# Patient Record
Sex: Male | Born: 1935 | Race: Black or African American | Hispanic: No | Marital: Married | Smoking: Never smoker
Health system: Southern US, Community
[De-identification: ages and names within clinical notes are randomized; demographics above are authoritative.]

## PROBLEM LIST (undated history)

## (undated) DIAGNOSIS — G4730 Sleep apnea, unspecified: Secondary | ICD-10-CM

## (undated) DIAGNOSIS — E7800 Pure hypercholesterolemia, unspecified: Secondary | ICD-10-CM

## (undated) DIAGNOSIS — I10 Essential (primary) hypertension: Secondary | ICD-10-CM

## (undated) DIAGNOSIS — I4891 Unspecified atrial fibrillation: Secondary | ICD-10-CM

## (undated) DIAGNOSIS — D62 Acute posthemorrhagic anemia: Secondary | ICD-10-CM

## (undated) DIAGNOSIS — E871 Hypo-osmolality and hyponatremia: Secondary | ICD-10-CM

## (undated) DIAGNOSIS — D72829 Elevated white blood cell count, unspecified: Secondary | ICD-10-CM

## (undated) DIAGNOSIS — K5900 Constipation, unspecified: Secondary | ICD-10-CM

## (undated) DIAGNOSIS — Z7901 Long term (current) use of anticoagulants: Secondary | ICD-10-CM

## (undated) DIAGNOSIS — M1990 Unspecified osteoarthritis, unspecified site: Secondary | ICD-10-CM

## (undated) DIAGNOSIS — M1611 Unilateral primary osteoarthritis, right hip: Secondary | ICD-10-CM

## (undated) DIAGNOSIS — E785 Hyperlipidemia, unspecified: Secondary | ICD-10-CM

## (undated) DIAGNOSIS — Z8679 Personal history of other diseases of the circulatory system: Secondary | ICD-10-CM

## (undated) DIAGNOSIS — J309 Allergic rhinitis, unspecified: Secondary | ICD-10-CM

## (undated) DIAGNOSIS — Z87898 Personal history of other specified conditions: Secondary | ICD-10-CM

## (undated) DIAGNOSIS — J45909 Unspecified asthma, uncomplicated: Secondary | ICD-10-CM

## (undated) DIAGNOSIS — Z8709 Personal history of other diseases of the respiratory system: Secondary | ICD-10-CM

## (undated) DIAGNOSIS — I499 Cardiac arrhythmia, unspecified: Secondary | ICD-10-CM

## (undated) DIAGNOSIS — Z950 Presence of cardiac pacemaker: Secondary | ICD-10-CM

## (undated) DIAGNOSIS — R2681 Unsteadiness on feet: Secondary | ICD-10-CM

## (undated) DIAGNOSIS — K219 Gastro-esophageal reflux disease without esophagitis: Secondary | ICD-10-CM

## (undated) DIAGNOSIS — J4541 Moderate persistent asthma with (acute) exacerbation: Secondary | ICD-10-CM

## (undated) HISTORY — DX: Hyperlipidemia, unspecified: E78.5

## (undated) HISTORY — DX: Unilateral primary osteoarthritis, right hip: M16.11

## (undated) HISTORY — PX: OTHER SURGICAL HISTORY: SHX169

## (undated) HISTORY — DX: Allergic rhinitis, unspecified: J30.9

## (undated) HISTORY — DX: Acute posthemorrhagic anemia: D62

## (undated) HISTORY — DX: Elevated white blood cell count, unspecified: D72.829

## (undated) HISTORY — DX: Unspecified asthma, uncomplicated: J45.909

## (undated) HISTORY — DX: Unspecified osteoarthritis, unspecified site: M19.90

## (undated) HISTORY — DX: Moderate persistent asthma with (acute) exacerbation: J45.41

## (undated) HISTORY — DX: Hypo-osmolality and hyponatremia: E87.1

## (undated) HISTORY — DX: Constipation, unspecified: K59.00

## (undated) HISTORY — DX: Unsteadiness on feet: R26.81

## (undated) HISTORY — DX: Personal history of other specified conditions: Z87.898

## (undated) HISTORY — DX: Long term (current) use of anticoagulants: Z79.01

## (undated) HISTORY — PX: HEMORRHOID SURGERY: SHX153

## (undated) HISTORY — PX: INSERTION, PACEMAKER COMPLETE: SHX4380

## (undated) HISTORY — PX: TOTAL HIP ARTHROPLASTY: SHX124

---

## 1998-08-27 ENCOUNTER — Ambulatory Visit (HOSPITAL_COMMUNITY): Admission: RE | Admit: 1998-08-27 | Discharge: 1998-08-27 | Payer: Self-pay | Admitting: Internal Medicine

## 2006-03-31 ENCOUNTER — Inpatient Hospital Stay
Admission: EM | Admit: 2006-03-31 | Disposition: A | Discharge status: Home / Self Care | Payer: Self-pay | Source: Emergency Department | Admitting: Internal Medicine

## 2006-03-31 LAB — COMPREHENSIVE METABOLIC PANEL - AH CERNER
ALT: 29 U/L (ref 0–41)
AST (SGOT): 41 U/L — ABNORMAL HIGH (ref 0–37)
Albumin/Globulin Ratio: 1.4 (ref 1.1–2.2)
Albumin: 4.1 g/dL (ref 3.4–4.8)
Alkaline Phosphatase: 114 U/L (ref 40–129)
Anion Gap: 9 mEq/L (ref 5–15)
BUN: 14 mg/dL (ref 8–23)
Bilirubin, Total: 0.7 mg/dL (ref 0.0–1.0)
CA: 3.7 mEq/L — ABNORMAL LOW (ref 3.8–4.6)
CO2: 27.2 mEq/L (ref 22.0–29.0)
Calcium: 8.4 mg/dL — ABNORMAL LOW (ref 8.8–10.2)
Chloride: 100 mEq/L (ref 96–108)
Creatinine: 1.2 mg/dL (ref 0.5–1.2)
Globulin: 3 g/dL (ref 2.0–3.6)
Glucose: 125 mg/dL
Osmolality Calculated: 284 mosm/kg (ref 282–298)
Potassium: 4 mEq/L (ref 3.3–5.1)
Protein, Total: 7.1 g/dL (ref 6.4–8.3)
Sodium: 136 mEq/L (ref 133–145)
UN/CREA SOFT: 12 RATIO (ref 6–33)

## 2006-03-31 LAB — URINALYSIS WITH MICROSCOPIC
Bilirubin, UA: NEGATIVE
Glucose, UA: NEGATIVE
Ketones UA: NEGATIVE
Leukocyte Esterase, UA: NEGATIVE
Nitrite, UA: NEGATIVE
Protein, UR: NEGATIVE
Specific Gravity UA POCT: 1.017 (ref 1.005–1.030)
Urine pH: 5 (ref 4.6–8.0)
Urobilinogen, UA: 0.2 EU/dL (ref 0.2–1.0)

## 2006-03-31 LAB — PT AND APTT
PT INR: 1.4 {INR} — ABNORMAL HIGH (ref 0.9–1.1)
PT: 15.7 s — ABNORMAL HIGH (ref 10.8–13.3)
PTT: 31 s (ref 21–32)

## 2006-03-31 LAB — CBC WITH AUTO DIFFERENTIAL CERNER
Basophils Absolute: 0 /mm3 (ref 0.0–0.2)
Basophils: 0 % (ref 0–2)
Eosinophils Absolute: 0 /mm3 (ref 0.0–0.2)
Eosinophils: 0 % (ref 0–5)
Granulocytes Absolute: 8.4 /mm3 — ABNORMAL HIGH (ref 1.8–8.1)
Hematocrit: 49.3 % (ref 42.0–52.0)
Hgb: 17 G/DL (ref 13.0–17.0)
Lymphocytes Absolute: 1.5 /mm3 (ref 0.5–4.4)
Lymphocytes: 14 % — ABNORMAL LOW (ref 15–41)
MCH: 33.6 PG — ABNORMAL HIGH (ref 28.0–32.0)
MCHC: 34.5 G/DL (ref 32.0–36.0)
MCV: 97.4 FL (ref 80.0–100.0)
MPV: 9.1 FL (ref 7.4–10.4)
Monocytes Absolute: 0.9 /mm3 (ref 0.0–1.2)
Monocytes: 8 % (ref 0–11)
Neutrophils %: 77 % — ABNORMAL HIGH (ref 52–75)
Platelets: 153 /mm3 (ref 140–400)
RBC: 5.06 /mm3 (ref 4.70–6.00)
RDW: 12.7 % (ref 11.5–15.0)
WBC: 10.9 /mm3 — ABNORMAL HIGH (ref 3.5–10.8)

## 2006-03-31 LAB — TROPONIN I: Troponin I: 0.01 ng/mL — AB (ref 0.10–1.30)

## 2006-03-31 LAB — LIPASE: Lipase: 58 U/L (ref 13–60)

## 2006-03-31 LAB — CREATINE KINASE W/O REFLEX (SOFT): Creatine Kinase (CK): 188 U/L (ref 24–195)

## 2006-03-31 LAB — GFR

## 2006-03-31 LAB — HEMOLYSIS INDEX: Hemolysis Index: 14 Units

## 2006-04-01 LAB — CREATINE KINASE W/O REFLEX (SOFT)
Creatine Kinase (CK): 162 U/L (ref 24–195)
Creatine Kinase (CK): 162 U/L (ref 24–195)

## 2006-04-01 LAB — HEMOLYSIS INDEX
Hemolysis Index: 8 Units
Hemolysis Index: 5 Units

## 2006-04-01 LAB — TROPONIN I
Troponin I: 0.02 ng/mL — ABNORMAL LOW (ref 0.10–1.30)
Troponin I: 0.01 ng/mL — AB (ref 0.10–1.30)

## 2006-04-02 LAB — MAGNESIUM: Magnesium: 1.8 mg/dL (ref 1.6–2.6)

## 2006-04-02 LAB — CBC WITH AUTO DIFFERENTIAL CERNER
Basophils Absolute: 0 /mm3 (ref 0.0–0.2)
Basophils: 1 % (ref 0–2)
Eosinophils Absolute: 0.1 /mm3 (ref 0.0–0.2)
Eosinophils: 2 % (ref 0–5)
Granulocytes Absolute: 2 /mm3 (ref 1.8–8.1)
Hematocrit: 44.3 % (ref 42.0–52.0)
Hgb: 15.2 G/DL (ref 13.0–17.0)
Lymphocytes Absolute: 3.1 /mm3 (ref 0.5–4.4)
Lymphocytes: 53 % — ABNORMAL HIGH (ref 15–41)
MCH: 33.6 PG — ABNORMAL HIGH (ref 28.0–32.0)
MCHC: 34.3 G/DL (ref 32.0–36.0)
MCV: 98 FL (ref 80.0–100.0)
MPV: 9.3 FL (ref 7.4–10.4)
Monocytes Absolute: 0.6 /mm3 (ref 0.0–1.2)
Monocytes: 10 % (ref 0–11)
Neutrophils %: 34 % — ABNORMAL LOW (ref 52–75)
Platelets: 137 /mm3 — ABNORMAL LOW (ref 140–400)
RBC: 4.51 /mm3 — ABNORMAL LOW (ref 4.70–6.00)
RDW: 12.7 % (ref 11.5–15.0)
WBC: 5.8 /mm3 (ref 3.5–10.8)

## 2006-04-02 LAB — BASIC METABOLIC PANEL - AH CERNER
Anion Gap: 8 mEq/L (ref 5–15)
BUN: 12 mg/dL (ref 8–23)
CO2: 25.5 mEq/L (ref 22.0–29.0)
Calcium: 7.9 mg/dL — ABNORMAL LOW (ref 8.8–10.2)
Chloride: 104 mEq/L (ref 96–108)
Creatinine: 0.9 mg/dL (ref 0.5–1.2)
Glucose: 92 mg/dL
Osmolality Calculated: 283 mosm/kg (ref 282–298)
Potassium: 4.1 mEq/L (ref 3.3–5.1)
Sodium: 137 mEq/L (ref 133–145)
UN/CREA SOFT: 13 RATIO (ref 6–33)

## 2006-04-02 LAB — HEMOLYSIS INDEX: Hemolysis Index: 3 Units

## 2006-04-02 LAB — GFR

## 2006-04-02 LAB — LIPID STUDY FASTING AM CERNER
Cholesterol / HDL Ratio: 3.53 RATIO (ref ?–4.97)
Cholesterol: 106 mg/dL (ref 100–199)
HDL: 30 mg/dL — ABNORMAL LOW (ref >55)
LDL: 65 mg/dL (ref ?–130)
Triglycerides: 56 md/dL (ref 10–190)
VLDL: 11 mg/dL (ref 0–40)

## 2006-04-02 LAB — T4: T4: 6.9 ug/dL (ref 5.6–11.0)

## 2006-04-02 LAB — T3: T3: 0.96 ng/mL (ref 0.84–1.75)

## 2006-04-02 LAB — PT/INR
PT INR: 1.2 {INR} — ABNORMAL HIGH (ref 0.9–1.1)
PT: 13.5 s — ABNORMAL HIGH (ref 10.8–13.3)

## 2006-04-02 LAB — TROPONIN I: Troponin I: 0.01 ng/mL — AB (ref 0.10–1.30)

## 2006-04-02 LAB — TSH: TSH: 5.07 u[IU]/mL — ABNORMAL HIGH (ref 0.400–4.610)

## 2006-04-02 LAB — CREATINE KINASE W/O REFLEX (SOFT): Creatine Kinase (CK): 160 U/L (ref 24–195)

## 2006-04-03 LAB — BASIC METABOLIC PANEL - AH CERNER
Anion Gap: 8 mEq/L (ref 5–15)
BUN: 13 mg/dL (ref 8–23)
CO2: 25.9 mEq/L (ref 22.0–29.0)
Calcium: 8.2 mg/dL — ABNORMAL LOW (ref 8.8–10.2)
Chloride: 101 mEq/L (ref 96–108)
Creatinine: 1.1 mg/dL (ref 0.5–1.2)
Glucose: 88 mg/dL
Osmolality Calculated: 280 mosm/kg — ABNORMAL LOW (ref 282–298)
Potassium: 3.8 mEq/L (ref 3.3–5.1)
Sodium: 135 mEq/L (ref 133–145)
UN/CREA SOFT: 12 RATIO (ref 6–33)

## 2006-04-03 LAB — TSH: TSH: 5.33 u[IU]/mL — ABNORMAL HIGH (ref 0.400–4.610)

## 2006-04-03 LAB — T4, FREE: T4 Free: 1.31 ng/dL (ref 0.94–1.65)

## 2006-04-03 LAB — HEMOLYSIS INDEX: Hemolysis Index: 7 Units

## 2006-04-03 LAB — GFR

## 2006-04-04 LAB — THYROID PEROXIDASE ANTIBODY-SOFT

## 2008-04-01 ENCOUNTER — Encounter: Payer: Self-pay | Admitting: Interventional Cardiology

## 2008-07-08 ENCOUNTER — Encounter (INDEPENDENT_AMBULATORY_CARE_PROVIDER_SITE_OTHER): Payer: Self-pay | Admitting: *Deleted

## 2008-09-21 ENCOUNTER — Telehealth: Payer: Self-pay | Admitting: Internal Medicine

## 2009-03-02 ENCOUNTER — Telehealth: Payer: Self-pay | Admitting: Internal Medicine

## 2010-03-08 NOTE — Progress Notes (Signed)
Summary: Schedule recall colon  Phone Note Outgoing Call Call back at [phone]   Call placed by: Christie Nottingham CMA Duncan Dull),  March 02, 2009 2:06 PM Call placed to: Patient Summary of Call: Called pt to schedule recall colon or to find out if they have decided to switch care after they moved. Pt's wife states her husband found another GI doctor closer and switched care. Note put in IDX Initial call taken by: Christie Nottingham CMA Duncan Dull),  March 02, 2009 2:07 PM

## 2010-11-12 LAB — ECG 12-LEAD
Atrial Rate: 52 {beats}/min
P Axis: 68 degrees
P-R Interval: 154 ms
Q-T Interval: 406 ms
QRS Duration: 86 ms
QTC Calculation (Bezet): 377 ms
R Axis: -14 degrees
T Axis: 104 degrees
Ventricular Rate: 52 {beats}/min

## 2010-11-24 NOTE — Procedures (Unsigned)
[name], [name]      MEDICAL RECORD NUMBER:         [id number]      PATIENT LOCATION/SERVICE:      [id number]            DATE OF PROCEDURE:             04/02/2006      PHYSICIAN:                     Sande Brothers, MD      REFERRING PHYSICIAN:            TAPE NO.: [id number]      COUNTER NO.: [id number]            CLINICAL INDICATIONS:  Syncope, hypertension.                                            Rabon Scholle-MODE/2-D            3.8      cm        Left Atrium                               (19-19mm)      3.0      cm        Aortic Root                               (19-35mm)      2.0      cm        Aortic Leaflet Separation                 (15-2mm)      --       cm        RVd                                       (7-47mm)      2.9      cm        LVs                                       (20-58mm)      4.5      cm        LVd                                       (37-32mm)      1.2      cm        IVSd                                      (6-66mm)      1.2      cm  LVPWd                                     (6-23mm)      --       %         Fractional Shortening                     (25-45%)      60       %         Estimated Ejection Fraction               (>55%)                                                        DOPPLER                                   PEAK       MEAN    DEGREE OF VALVULAR   VALVE        VALVE    PEAK VELOCITY   GRADIENT   GRADIENT     REGURGITATION      AREA      Mitral        1.0           mmHg       mmHg                          cm. sq.                 Randi Poullard/s(0.6-1.3)      Aortic        1.6           mmHg       mmHg                          cm. sq.                 Jazzalyn Loewenstein/s(1.0-1.7)      Pulmonic      0.8           mmHg       mmHg                          cm. sq.                 Evely Gainey/s(0.6-0.9)      Tricuspid     0.5           mmHg       mmHg                          cm. sq.                 Elinda Bunten/s(0.3-0.7)      LVOT          1.2          ---------  ---------      ---------      --------  Timea Breed/s(0.7-1.7)    -                                        -            TWO-DIMENSIONAL AND DOPPLER COMMENTS: The left ventricle shows hypertrophy.      The left ventricular systolic function is normal with ejection fraction of      60%.  The left atrial size is normal.  The right atrium and right      ventricular size is normal. The mitral valve shows mild regurgitation.      There is no evidence of mitral stenosis. The aortic valve has three cusps      and shows trace regurgitation. There was no evidence of aortic stenosis.      The tricuspid valve shows mild regurgitation. The pulmonic valve shows mild      regurgitation.  The right ventricular systolic pressure is 38 mmHg      suggestive of mild pulmonary hypertension.  There was no pericardial      effusion.            IMPRESSION:      1. Mild left ventricular hypertrophy.      2. Normal left ventricular systolic function.      3. Ejection fraction 65%.      4. Mild mitral regurgitation.      5. Mild tricuspid regurgitation.      6. Mild pulmonary hypertension.      7. No pericardial effusion.      8. Aortic valve sclerosis without evidence of stenosis.            ___________________________________      Judie Petit. Wonda Cheng, MD            Jedd Schulenburg Z:amw:CCA      D:     04/02/2006      T:     04/03/2006      #:     [id number]      N:     [id number]            cc:    Devoria Albe, MD             Sande Brothers, MD

## 2010-11-24 NOTE — Discharge Summary (Signed)
PATIENTAVONDRE, [name]      MEDICAL RECORD NUMBER:         [id number]            ATTENDING PHYSICIAN:           Devoria Albe, MD      DATE OF ADMISSION:             03/31/2006      DATE OF DISCHARGE:             04/03/2006            DISCHARGE DIAGNOSES:   Syncope, status post stress thallium negative.      Neurology workup negative.  An EEG is to be done as an outpatient, history      of asthma and hypertension, history of bradycardia which is also not a new      finding for the patient.            DISCHARGE MEDICATIONS:   Enteric-coated aspirin 325 mg once daily, Advair      Diskus 259/50 mcg once daily, Singulair 10 mg once daily, and Zestril 10 mg      once daily.            HOSPITAL COURSE:   The patient is a pleasant gentleman who was admitted for      syncope to the telemetry unit to the tracheostomy unit.  A myocardial      infarction has been ruled out.  A neurology and cardiology consultations      were obtained, and they were negative as he was bradycardic.  TSH was also      noted to be just slightly high.  As a result, an endocrine consult was      done.  Free T-4 level was ordered, and TPO antibody titers were also      ordered.  They are pending; however, this can be followed as an outpatient.      His stress thallium test showed a septal hypokinesis and no evidence of      ischemia with mildly subnormal LVEF.  As a result, he is being discharged      to home to be followed up with Dr. Philip Aspen, Dr. Allene Pyo, his primary MD, Dr.      Ladona Ridgel, and Dr. Lanell Matar as an outpatient for an EEG.  No further      complications.            Her discharge physical examination is at baseline.  HEENT exam:  The head      is normocephalic and atraumatic, EOMI intact.  Neck:  Supple.  Chest:      Clear.  Cardiovascular Exam:  S1 and S2 are regular, no S3 or S4.  Abdomen:      Soft, bowels sounds are positive.  Neurological system:  The patient is      alert and oriented x3 with no gross focal  deficits.  Extremities:  No      cyanosis, clubbing, or edema.  Dermatologic:  The skin is pink.                                    Electronic Signing MD: Devoria Albe, MD  [id number]      D:    04/03/2006      T:    04/06/2006      #:    [id number]      N:    [id number]            cc:   Devoria Albe, MD

## 2010-11-24 NOTE — Consults (Signed)
[name], [name]      MEDICAL RECORD NUMBER:         [id number]      PATIENT LOCATION/SERVICE:      [id number]            DATE OF CONSULTATION:          04/02/2006      CONSULTING PHYSICIAN:          Daune Perch, MD      REFERRING PHYSICIAN:     Devoria Albe, MD            NEUROLOGY CONSULTATION            REASON FOR CONSULTATION:  Syncope.            DETAILS:  This is a 76 year old very healthy gentleman who plays tennis      frequently. He reported that while having dinner on 03/31/2006 in a      restaurant he collapsed with loss of consciousness and there is no      witnessed event or shaking and jerking.  He denied tongue biting, loss of      bowel control or injury.            REVIEW OF SYSTEMS:  He denied headache, visual blurring, double vision,      chewing or swallowing difficulties, weakness or numbness of the      extremities.  He admits to recent shortness of breath more than usual and      has a history of asthma, but he denies paroxysmal nocturnal dyspnea,      orthopnea or edema.  He denies dysuria, hematuria, urgency, frequency.  He      denies abp, nausea, vomiting, diarrhea, blood in the bowels.            PAST MEDICAL HISTORY:  Asthma.            SOCIAL HISTORY:  Nonsmoker.  Drinks alcohol on occasion.  He lives with his      wife.  He plays tennis frequently.            MEDICATIONS:  Singulair.            ALLERGIES:  None known.            PAST SURGICAL HISTORY:  Not significant.            FAMILY HISTORY:  His father had a stroke.            PHYSICAL EXAMINATION:  On examination, pleasant well-groomed elderly      gentleman not in obvious distress.  Blood pressure 146/84, pulse 55 per      minute and regular, temperature 96.8 degrees Fahrenheit.  Head, ears, nose      and throat:  Entirely normal.  There is no pallor, cyanosis, clubbing,      jaundice, edema or significant lymphadenopathy.  Neurologic examination:      He is alert, awake, oriented in  time, place and person with normal memory      and language.  Pupils are equal and reactive to light and accommodation.      Visual fields are full to confrontation.   Fundus is normal.  Extraocular      movements are full without nystagmus.  Facial sensation, jaw strength,  facial movements, hearing, palate, neck, shoulder and tongue are normal and      symmetrical.  Tone is normal.  Extremities:  Strength is 5/5.  Reflexes are      2+ and symmetric,  reflexes are plantar response.  There is no ataxia.            LABS:  WBC 5.8, hemoglobin 15.1, hematocrit 44.3, platelets 137,000.      Neutrophils 34%, lymphocytes 56%, monocytes 20%, eosinophils 2%, basophils      1%.  Sodium 137, potassium 4.1, chloride 104, bicarbonate 25.5, BUN 12,      creatinine 0.9, glucose 92.  Ionized calcium 3.7, magnesium 1.8,      cholesterol 106, triglycerides 56, HDL 30, VLDL 11, LDL 65.  TSH 5.07, INR      1.2.  Urinalysis negative.            CT of the head on 03/31/2006, no acute changes are noted.            IMPRESSION:  Syncope.  Cardiogenic syncope has to be distinguished from      neurogenic syncope due to cerebrovascular artery insufficiency/transient      ischemic attack, non-convulsive seizures.            PLAN      1.   CT angiogram of head and neck.      2.   Echocardiogram and EEG.      3.   The patient should continue aspirin and antihypertensives, Zestril,      and further changes will be made based on the results of the testing.                                    Electronic Signing MD: Daune Perch, MD                  [id number]      D:    04/03/2006      T:    04/03/2006      #:    [id number]      N:    [id number]            cc:   Daune Perch, MD            Devoria Albe, MD

## 2010-11-24 NOTE — H&P (Signed)
[name], [name]      MEDICAL RECORD NUMBER:         [id number]      PATIENT LOCATION/SERVICE:      [id number]            DATE OF ADMISSION:             03/31/2006      ATTENDING PHYSICIAN:           Devoria Albe, MD            HISTORY OF PRESENT ILLNESS:  This patient is a 76 year old gentleman who      was having dinner last night in a restaurant and suddenly he collapsed with      loss of consciousness.  As a result, he was brought by paramedics to the      emergency room and was subsequently admitted here.  He was admitted for a      syncopal episode.  He has a history of asthma but denies any symptoms of      chest pain.  He admits to having shortness of breath more than usual      lately.  He denies any paroxysmal nocturnal dyspnea or orthopnea or      swelling of the feet.  He admits to having a history of hypertension but he      is not taking any medications at this time for that.            PAST MEDICAL HISTORY:  His past medical history is again significant for      asthma.            SOCIAL HISTORY:  Nonsmoker.  He drinks alcohol on social occasions.  No      history of drug abuse.  He is married, lives with his wife.            MEDICATIONS:   Current medications include Singulair.            ALLERGIES:   No known drug allergies.            PAST SURGICAL HISTORY:  Not significant.            FAMILY HISTORY:  Family history is noncontributory except that his father      died of a stroke.            REVIEW OF SYSTEMS:  Review of systems is unremarkable.            PHYSICAL EXAMINATION:  Vital Signs:  Blood pressure is 131/80, pulse is 46,      temperature is 97.6.  HEENT:  Head is normocephalic and atraumatic.      Extraocular movements intact.  Neck:  Supple.  Chest:  Clear.      Cardiovascular:  S1, S2 were regular.  No S3 or S4.  Abdomen:  Soft.  Bowel      sounds were positive.  Central Nervous System:  The patient was alert and      oriented x3.  No gross focal  deficits.  Extremities:  No cyanosis, clubbing      or edema.            LABORATORY DATA:  A chest x-ray is negative.  CT scan of the head is      negative.  A  12-lead EKG showed bradycardia with sinus arrhythmia.      Laboratory data shows a white blood cell count of 10.9, hemoglobin and      hematocrit of 17 and 49.2.  Platelet count was 153.  Serum chemistries were      relatively normal.  PT is 15.7, INR is 1.4.  PTT is 31.  Urinalysis is      negative.            IMPRESSION      1.   Syncope with bradycardia.  Rule out for causes that could contribute      to syncope.  Rule out myocardial infarction.  Check neurology workup.      2.   The patient also has an abnormal PT and PTT.  Repeat the test.  Could      be false positive.            PLAN:  I will hydrate the patient and check orthostatics.  Will obtain      syncope workup and will follow up on the patient.                                    Electronic Signing [id number] Cory Roughen, MD                  [id number]      D:    04/01/2006      T:    04/02/2006      #:    [id number]      N:    [id number]            cc:   Devoria Albe, MD

## 2010-11-24 NOTE — Consults (Unsigned)
[name], [name]      MEDICAL RECORD NUMBER:         [id number]      PATIENT LOCATION/SERVICE:      [id number]            DATE OF CONSULTATION:          04/01/2006      CONSULTING PHYSICIAN:          Arnetra Terris. Wonda Cheng, MD      REFERRING PHYSICIAN:            CARDIOLOGY CONSULTATION            HISTORY OF PRESENT ILLNESS:  The patient is a 76 -year-old gentleman with      history of hyperlipidemia, hypertension, and asthma who was brought to the      emergency room after he had a syncopal episode.  According to the patient      he was sitting a restaurant having his dinner when he felt dizzy,      light-headed and also experienced abdominal pain.  He got up to go to the      bathroom and then the next thing he remembers was people standing around      him and he was told that he had blacked out.  The rescue squad was called.      The patient was then brought to the emergency room at [facility].                  Initial evaluation in the emergency room showed blood pressure 164/118,      heart rate 48, and respirations 20.            The patient does not have history of heart attack or stroke.  The patient      has history of asthma and hyperlipidemia.            MEDICATIONS:  Medications include Lipitor, hydrochlorothiazide, and      Singulair.            SOCIAL HISTORY:  The patient is married and has grown up children.  The      patient does not smoke cigarettes.            REVIEW OF SYSTEMS:  The patient denies headache, nausea, vomiting, chest      pain, shortness of breath, abdominal pain.            PHYSICAL EXAMINATION: The patient is awake and alert. He does not look      under distress.  Blood pressure is 131/80.  Heart rate is 46.  Respirations      are 20. Neck shows no jugular venous distention.  Heart exam shows S1 and      S2, no S3.  Lungs are clear. Abdomen is soft and nontender.  Extremities      show no edema.  Dorsalis pedis pulses are 1+ bilaterally.             LABORATORY DATA:  White blood cell count 10.9, hemoglobin 17, hematocrit      49.3, platelet count 153, sodium 136, potassium 4.0, chloride 100, CO2 27,      BUN 14, creatinine 1.2, troponin I 0.01.            IMPRESSION:      1.  Syncope.      2. Hypertension.      3. History of asthma.            RECOMMENDATIONS:      1. Admit to telemetry unit.      2. Cardiac enzymes, creatinine phosphokinase, and microbiological assay      isoenzymes every eight hours times three.      3. Troponin I level.      4. Lipid profile.      5. Thyroid function test.      6. Echocardiogram.      7. Stress test.      8. Aspirin.      9. Angiotensin converting enzyme (ACE) inhibitor.                        ___________________________________        Date Signed: _______________      Judie Petit. Wonda Cheng, MD                  Tell Rozelle Z:amw:TAH      D:    04/01/2006      T:    04/02/2006      #:    [id number]      N:    [id number]            cc:   Devoria Albe, MD            Sierrah Luevano. Wonda Cheng, MD

## 2010-11-24 NOTE — Procedures (Unsigned)
PATIENT:          Depierro, Joaovictor      CARDIOLOGIST: Lenward Able. Wonda Cheng, MD      REFERRING PHYSICIAN:      MEDICAL RECORD NUMBER:    [id number]      DATE OF STUDY:    04/02/2006      PATIENT LOCATION/SERVICE: [id number]            EXERCISE TREADMILL TEST            INDICATIONS:            SUMMARY:  The patient underwent exercise treadmill test.  He exercised for      8 minutes and 50 seconds, reaching his target heart rate and achieved 10.1      METS.  The patient did not experience any chest pain and electrocardiogram      showed 1-2 mm ST depression in leads II, III, AVF, V5 and V6.            IMPRESSION:  Positive exercise treadmill test.                        ___________________________________      Judie Petit. Wonda Cheng, MD            Marybeth Dandy Z:amw:CCA      D:   04/02/2006      T:   04/02/2006      #:   [id number]      N:   [id number]            cc:  Ezma Rehm. Wonda Cheng, MD

## 2010-11-24 NOTE — Procedures (Unsigned)
Please disregard this entry. Duplicate report.            (SLO   04/03/06)

## 2013-02-06 ENCOUNTER — Emergency Department (INDEPENDENT_AMBULATORY_CARE_PROVIDER_SITE_OTHER)
Admission: EM | Admit: 2013-02-06 | Discharge: 2013-02-06 | Disposition: A | Discharge status: Home / Self Care | Payer: Federal, State, Local not specified - PPO | Source: Home / Self Care | Attending: Family Medicine | Admitting: Family Medicine

## 2013-02-06 ENCOUNTER — Encounter (HOSPITAL_COMMUNITY): Payer: Self-pay | Admitting: Emergency Medicine

## 2013-02-06 DIAGNOSIS — L259 Unspecified contact dermatitis, unspecified cause: Secondary | ICD-10-CM

## 2013-02-06 HISTORY — DX: Essential (primary) hypertension: I10

## 2013-02-06 MED ORDER — TRIAMCINOLONE ACETONIDE 0.1 % EX CREA: 1.0000 "application " | TOPICAL_CREAM | Freq: Three times a day (TID) | CUTANEOUS | Status: DC

## 2013-02-06 NOTE — Discharge Instructions (Signed)

## 2013-02-06 NOTE — ED Provider Notes (Signed)
CSN: [id number]     Arrival date & time 02/06/13  0846 History   None    Chief Complaint  Patient presents with  . Rash   (Consider location/radiation/quality/duration/timing/severity/associated sxs/prior Treatment) HPI Comments: 79 year old male presents complaining of rash around his upper chest, neck, and under arms for 3 days. He first noticed this when he woke up 3 days ago. The rash was red and itchy and got worse in the shower. A result of the day, but the next morning it was back again. He began putting hydrocortisone cream on this which did not help significantly. This morning, the rash was very uncomfortable. He put Itch-X gel on the rash which was very helpful. However, the rash is still noticeable. The admits to getting new pajamas over Christmas. He has been wearing these for the past few nights. Also, he is currently dealing with some mild jock itch that is responding well to Lotrimin.  Patient is a 79 y.o. male presenting with rash.  Rash Associated symptoms: no abdominal pain, no diarrhea, no fatigue, no fever, no joint pain, no myalgias, no nausea, no shortness of breath, no sore throat and not vomiting     Past Medical History  Diagnosis Date  . Hypertension    History reviewed. No pertinent past surgical history. History reviewed. No pertinent family history. History  Substance Use Topics  . Smoking status: Not on file  . Smokeless tobacco: Not on file  . Alcohol Use: No    Review of Systems  Constitutional: Negative for fever, chills and fatigue.  HENT: Negative for sore throat.   Eyes: Negative for visual disturbance.  Respiratory: Negative for cough and shortness of breath.   Cardiovascular: Negative for chest pain, palpitations and leg swelling.  Gastrointestinal: Negative for nausea, vomiting, abdominal pain, diarrhea and constipation.  Genitourinary: Negative for dysuria, urgency, frequency and hematuria.  Musculoskeletal: Negative for arthralgias,  myalgias, neck pain and neck stiffness.  Skin: Positive for rash.  Neurological: Negative for dizziness, weakness and light-headedness.    Allergies  Penicillins  Home Medications   Current Outpatient Rx  Name  Route  Sig  Dispense  Refill  . amLODipine (NORVASC) 10 MG tablet   Oral   Take 10 mg by mouth daily.         Marland Kitchen. atorvastatin (LIPITOR) 20 MG tablet   Oral   Take 20 mg by mouth daily.         Marland Kitchen. lisinopril (PRINIVIL,ZESTRIL) 40 MG tablet   Oral   Take 40 mg by mouth 2 (two) times daily.         Marland Kitchen. albuterol (PROVENTIL HFA;VENTOLIN HFA) 108 (90 BASE) MCG/ACT inhaler   Inhalation   Inhale into the lungs every 6 (six) hours as needed for wheezing or shortness of breath.         . triamcinolone cream (KENALOG) 0.1 %   Topical   Apply 1 application topically 3 (three) times daily.   45 g   1    BP 166/88  Pulse 59  Temp(Src) 97.7 F (36.5 C) (Oral)  Resp 16  SpO2 98% Physical Exam  Nursing note and vitals reviewed. Constitutional: He is oriented to person, place, and time. He appears well-developed and well-nourished. No distress.  HENT:  Head: Normocephalic and atraumatic.  Pulmonary/Chest: Effort normal. No respiratory distress.  Neurological: He is alert and oriented to person, place, and time. Coordination normal.  Skin: Skin is warm and dry. Rash (erythematous, maculopapular rash consistent with contact  dermatitis. This is present at the shirt line of the neck and less so in the under arms.) noted. He is not diaphoretic.  Psychiatric: He has a normal mood and affect. Judgment normal.    ED Course  Procedures (including critical care time) Labs Review Labs Reviewed - No data to display Imaging Review No results found.    MDM   1. Contact dermatitis    Avoid wearing any of his new shirts for a few days and use the triamcinolone cream until this resolves. Also use the Itch-X as needed for symptoms. Followup when necessary   New  Prescriptions   TRIAMCINOLONE CREAM (KENALOG) 0.1 %    Apply 1 application topically 3 (three) times daily.       Graylon Good, PA-C 02/06/13 [phone]

## 2013-02-06 NOTE — ED Notes (Signed)
C/o red rash around chest and neck area that he noticed 2 days ago. Stated that the rash started as red patches under his arms first 3 days ago. Has taken OTC medications for itching with no relief. Denies any pain/drainage. Written by: Marga MelnickQuaNeisha Jones

## 2013-02-07 NOTE — ED Provider Notes (Signed)
Medical screening examination/treatment/procedure(s) were performed by resident physician or non-physician practitioner and as supervising physician I was immediately available for consultation/collaboration.   Barkley BrunsKINDL,Liddie Chichester DOUGLAS MD.   Linna HoffJames D Delwin Raczkowski, MD 02/07/13 [phone]

## 2013-04-25 ENCOUNTER — Encounter (HOSPITAL_COMMUNITY): Payer: Self-pay | Admitting: Emergency Medicine

## 2013-04-25 ENCOUNTER — Inpatient Hospital Stay (HOSPITAL_COMMUNITY): Payer: Federal, State, Local not specified - PPO

## 2013-04-25 ENCOUNTER — Inpatient Hospital Stay (HOSPITAL_COMMUNITY)
Admission: EM | Admit: 2013-04-25 | Discharge: 2013-04-29 | DRG: 310 | Disposition: A | Discharge status: Home / Self Care | Payer: Federal, State, Local not specified - PPO | Attending: Cardiovascular Disease | Admitting: Cardiovascular Disease

## 2013-04-25 DIAGNOSIS — I10 Essential (primary) hypertension: Secondary | ICD-10-CM | POA: Diagnosis present

## 2013-04-25 DIAGNOSIS — E785 Hyperlipidemia, unspecified: Secondary | ICD-10-CM | POA: Diagnosis present

## 2013-04-25 DIAGNOSIS — R55 Syncope and collapse: Secondary | ICD-10-CM | POA: Diagnosis present

## 2013-04-25 DIAGNOSIS — Z79899 Other long term (current) drug therapy: Secondary | ICD-10-CM

## 2013-04-25 DIAGNOSIS — I4892 Unspecified atrial flutter: Principal | ICD-10-CM | POA: Diagnosis present

## 2013-04-25 HISTORY — DX: Hyperlipidemia, unspecified: E78.5

## 2013-04-25 LAB — BASIC METABOLIC PANEL
BUN: 11 mg/dL (ref 6–23)
CHLORIDE: 98 meq/L (ref 96–112)
CO2: 28 mEq/L (ref 19–32)
Calcium: 9.3 mg/dL (ref 8.4–10.5)
Creatinine, Ser: 1.06 mg/dL (ref 0.50–1.35)
GFR calc non Af Amer: 66 mL/min — ABNORMAL LOW (ref >90)
GFR, EST AFRICAN AMERICAN: 76 mL/min — AB (ref >90)
Glucose, Bld: 95 mg/dL (ref 70–99)
Potassium: 4.7 mEq/L (ref 3.7–5.3)
Sodium: 137 mEq/L (ref 137–147)

## 2013-04-25 LAB — CBC
HEMATOCRIT: 45.9 % (ref 39.0–52.0)
Hemoglobin: 16.7 g/dL (ref 13.0–17.0)
MCH: 33.8 pg (ref 26.0–34.0)
MCHC: 36.4 g/dL — ABNORMAL HIGH (ref 30.0–36.0)
MCV: 92.9 fL (ref 78.0–100.0)
Platelets: 216 10*3/uL (ref 150–400)
RBC: 4.94 MIL/uL (ref 4.22–5.81)
RDW: 12.3 % (ref 11.5–15.5)
WBC: 8.2 10*3/uL (ref 4.0–10.5)

## 2013-04-25 LAB — I-STAT TROPONIN, ED: Troponin i, poc: 0 ng/mL (ref 0.00–0.08)

## 2013-04-25 MED ORDER — ATORVASTATIN CALCIUM 20 MG PO TABS
20.0000 mg | ORAL_TABLET | Freq: Every day | ORAL | Status: DC
  Administered 2013-04-26 – 2013-04-29 (×4): 20 mg via ORAL
  Filled 2013-04-25 (×6): qty 1

## 2013-04-25 MED ORDER — ALBUTEROL SULFATE HFA 108 (90 BASE) MCG/ACT IN AERS: 1.0000 | INHALATION_SPRAY | Freq: Four times a day (QID) | RESPIRATORY_TRACT | Status: DC | PRN

## 2013-04-25 MED ORDER — HEPARIN (PORCINE) IN NACL 100-0.45 UNIT/ML-% IJ SOLN
1200.0000 [IU]/h | INTRAMUSCULAR | Status: AC
  Administered 2013-04-25 – 2013-04-26 (×2): 1200 [IU]/h via INTRAVENOUS
  Filled 2013-04-25 (×3): qty 250

## 2013-04-25 MED ORDER — FLUTICASONE PROPIONATE HFA 44 MCG/ACT IN AERO
2.0000 | INHALATION_SPRAY | Freq: Two times a day (BID) | RESPIRATORY_TRACT | Status: DC
  Filled 2013-04-25: qty 10.6

## 2013-04-25 MED ORDER — HEPARIN BOLUS VIA INFUSION
4500.0000 [IU] | Freq: Once | INTRAVENOUS | Status: AC
  Administered 2013-04-25: 4500 [IU] via INTRAVENOUS
  Filled 2013-04-25: qty 4500

## 2013-04-25 MED ORDER — MONTELUKAST SODIUM 10 MG PO TABS
10.0000 mg | ORAL_TABLET | Freq: Every day | ORAL | Status: DC
  Administered 2013-04-25 – 2013-04-28 (×4): 10 mg via ORAL
  Filled 2013-04-25 (×5): qty 1

## 2013-04-25 MED ORDER — HYDROCHLOROTHIAZIDE 25 MG PO TABS
25.0000 mg | ORAL_TABLET | Freq: Every evening | ORAL | Status: DC
  Administered 2013-04-26 – 2013-04-28 (×3): 25 mg via ORAL
  Filled 2013-04-25 (×5): qty 1

## 2013-04-25 MED ORDER — ONDANSETRON HCL 4 MG/2ML IJ SOLN: 4.0000 mg | Freq: Four times a day (QID) | INTRAMUSCULAR | Status: DC | PRN

## 2013-04-25 MED ORDER — ALPRAZOLAM 0.25 MG PO TABS: 0.2500 mg | ORAL_TABLET | Freq: Two times a day (BID) | ORAL | Status: DC | PRN

## 2013-04-25 MED ORDER — TRIAMCINOLONE ACETONIDE 0.1 % EX CREA: 1.0000 "application " | TOPICAL_CREAM | Freq: Two times a day (BID) | CUTANEOUS | Status: DC | PRN

## 2013-04-25 MED ORDER — CICLESONIDE 160 MCG/ACT IN AERS: 2.0000 | INHALATION_SPRAY | Freq: Two times a day (BID) | RESPIRATORY_TRACT | Status: DC

## 2013-04-25 MED ORDER — SODIUM CHLORIDE 0.9 % IJ SOLN
3.0000 mL | Freq: Two times a day (BID) | INTRAMUSCULAR | Status: DC
  Administered 2013-04-25 – 2013-04-29 (×6): 3 mL via INTRAVENOUS

## 2013-04-25 MED ORDER — ZOLPIDEM TARTRATE 5 MG PO TABS: 5.0000 mg | ORAL_TABLET | Freq: Every evening | ORAL | Status: DC | PRN

## 2013-04-25 MED ORDER — ACETAMINOPHEN 325 MG PO TABS: 650.0000 mg | ORAL_TABLET | ORAL | Status: DC | PRN

## 2013-04-25 MED ORDER — PANTOPRAZOLE SODIUM 40 MG PO TBEC
40.0000 mg | DELAYED_RELEASE_TABLET | Freq: Every day | ORAL | Status: DC
  Administered 2013-04-26 – 2013-04-29 (×4): 40 mg via ORAL
  Filled 2013-04-25 (×4): qty 1

## 2013-04-25 MED ORDER — AMLODIPINE BESYLATE 10 MG PO TABS
10.0000 mg | ORAL_TABLET | Freq: Every day | ORAL | Status: DC
  Administered 2013-04-26 – 2013-04-29 (×4): 10 mg via ORAL
  Filled 2013-04-25 (×4): qty 1

## 2013-04-25 MED ORDER — SODIUM CHLORIDE 0.9 % IV SOLN
250.0000 mL | INTRAVENOUS | Status: DC | PRN
  Administered 2013-04-25: 250 mL via INTRAVENOUS

## 2013-04-25 MED ORDER — NITROGLYCERIN 0.4 MG SL SUBL: 0.4000 mg | SUBLINGUAL_TABLET | SUBLINGUAL | Status: DC | PRN

## 2013-04-25 MED ORDER — ASPIRIN EC 81 MG PO TBEC
81.0000 mg | DELAYED_RELEASE_TABLET | Freq: Every day | ORAL | Status: DC
  Administered 2013-04-26 – 2013-04-29 (×4): 81 mg via ORAL
  Filled 2013-04-25 (×4): qty 1

## 2013-04-25 MED ORDER — LISINOPRIL 40 MG PO TABS
40.0000 mg | ORAL_TABLET | Freq: Two times a day (BID) | ORAL | Status: DC
  Administered 2013-04-25 – 2013-04-29 (×8): 40 mg via ORAL
  Filled 2013-04-25 (×9): qty 1

## 2013-04-25 MED ORDER — SODIUM CHLORIDE 0.9 % IJ SOLN: 3.0000 mL | INTRAMUSCULAR | Status: DC | PRN

## 2013-04-25 NOTE — ED Notes (Signed)
Isaiah ScheuermannHank Murphy, Cardi[facility]  [facility] he [facility]. Pt is established at this practice. States Pt has had no history of flutter. States Pt has also had many syncopal episodes. Dr. Allyson SabalBerry is on today. Nicki Guadalajararicia is triage nurse made aware of patient. Contact her and she will contact Dr for practice. [phone].

## 2013-04-25 NOTE — ED Notes (Signed)
Pt reports heart fluttering onset Tuesday, near syncopal at times, sts had episodes of "shaking or twitching" in neck.

## 2013-04-25 NOTE — H&P (Signed)
Patient ID: JEROL ELZA MRN: [id number], DOB/AGE: [birth year]   Admit date: 04/25/2013   Primary Physician: No PCP Per Patient Primary Cardiologist: Dr Allyson Sabal  HPI: 79 y/o male, lives in [place] area but frequents [place] on family visits. He has no history of CAD, or arrythmia. He had syncope 4 years ago with a negative work up. He had a GXT one year ago by his family MD.           This week he has had 3-5 near syncope spells. He went to urgent care today to checked out before returning to [place]. He was noted to be in A flutter with CVR. He has had palpitations for years but never a diagnosis of arrythmia. He is admitted now for further evaluation.   Problem List: Past Medical History  Diagnosis Date  . Hypertension   . Dyslipidemia     Past Surgical History  Procedure Laterality Date  . Hemorrhoid surgery       Allergies:  Allergies  Allergen Reactions  . Penicillins     At the injection site.     Home Medications No current facility-administered medications for this encounter.   Current Outpatient Prescriptions  Medication Sig Dispense Refill  . albuterol (PROVENTIL HFA;VENTOLIN HFA) 108 (90 BASE) MCG/ACT inhaler Inhale into the lungs every 6 (six) hours as needed for wheezing or shortness of breath.      Marland Kitchen amLODipine (NORVASC) 10 MG tablet Take 10 mg by mouth daily.      Marland Kitchen atorvastatin (LIPITOR) 20 MG tablet Take 20 mg by mouth daily.      . ciclesonide (ALVESCO) 160 MCG/ACT inhaler Inhale 2 puffs into the lungs 2 (two) times daily.      Marland Kitchen esomeprazole (NEXIUM) 40 MG capsule Take 40 mg by mouth daily at 12 noon.      . hydrochlorothiazide (HYDRODIURIL) 25 MG tablet Take 25 mg by mouth every evening.      Marland Kitchen lisinopril (PRINIVIL,ZESTRIL) 40 MG tablet Take 40 mg by mouth 2 (two) times daily.      . montelukast (SINGULAIR) 10 MG tablet Take 10 mg by mouth at bedtime.      . Multiple Vitamins-Minerals (MEGA MULTI MEN PO) Take 1 tablet by mouth daily.      Marland Kitchen triamcinolone  cream (KENALOG) 0.1 % Apply 1 application topically 2 (two) times daily as needed (itching).         Family History  Problem Relation Age of Onset  . Hypertension Father      History   Social History  . Marital Status: Single    Spouse Name: N/A    Number of Children: N/A  . Years of Education: N/A   Occupational History  . Not on file.   Social History Main Topics  . Smoking status: Never Smoker   . Smokeless tobacco: Not on file  . Alcohol Use: No  . Drug Use: No  . Sexual Activity: Not on file   Other Topics Concern  . Not on file   Social History Narrative   Married, Scientist, research (medical) in [place]     Review of Systems: General: negative for chills, fever, night sweats or weight changes.  Cardiovascular: negative for chest pain, dyspnea on exertion, edema, orthopnea, palpitations, paroxysmal nocturnal dyspnea or shortness of breath Dermatological: negative for rash Respiratory: negative for cough or wheezing Urologic: negative for hematuria Abdominal: negative for nausea, vomiting, diarrhea, bright red blood per rectum, melena, or  hematemesis Neurologic: negative for visual changes, syncope, or dizziness All other systems reviewed and are otherwise negative except as noted above.  Physical Exam: Blood pressure 159/83, pulse 57, temperature 97.6 F (36.4 C), temperature source Axillary, resp. rate 15, height 5\' 9"  (1.753 m), weight 193 lb (87.544 kg), SpO2 99.00%.  General appearance: alert, cooperative and no distress Neck: no carotid bruit and no JVD Lungs: clear to auscultation bilaterally Heart: regular rate and rhythm Abdomen: soft, non-tender; bowel sounds normal; no masses,  no organomegaly Extremities: extremities normal, atraumatic, no cyanosis or edema Pulses: 2+ and symmetric Skin: Skin color, texture, turgor normal. No rashes or lesions Neurologic: Grossly normal    Labs:   Results for orders placed during the hospital  encounter of 04/25/13 (from the past 24 hour(s))  CBC     Status: Abnormal   Collection Time    04/25/13  1:50 PM      Result Value Ref Range   WBC 8.2  4.0 - 10.5 K/uL   RBC 4.94  4.22 - 5.81 MIL/uL   Hemoglobin 16.7  13.0 - 17.0 g/dL   HCT 41.3  24.4 - 01.0 %   MCV 92.9  78.0 - 100.0 fL   MCH 33.8  26.0 - 34.0 pg   MCHC 36.4 (*) 30.0 - 36.0 g/dL   RDW 27.2  53.6 - 64.4 %   Platelets 216  150 - 400 K/uL  BASIC METABOLIC PANEL     Status: Abnormal   Collection Time    04/25/13  1:50 PM      Result Value Ref Range   Sodium 137  137 - 147 mEq/L   Potassium 4.7  3.7 - 5.3 mEq/L   Chloride 98  96 - 112 mEq/L   CO2 28  19 - 32 mEq/L   Glucose, Bld 95  70 - 99 mg/dL   BUN 11  6 - 23 mg/dL   Creatinine, Ser 0.34  0.50 - 1.35 mg/dL   Calcium 9.3  8.4 - 74.2 mg/dL   GFR calc non Af Amer 66 (*) >90 mL/min   GFR calc Af Amer 76 (*) >90 mL/min  I-STAT TROPOININ, ED     Status: None   Collection Time    04/25/13  2:07 PM      Result Value Ref Range   Troponin i, poc 0.00  0.00 - 0.08 ng/mL   Comment 3              Radiology/Studies: No results found.  EKG: A Flutter with 4:1 conduction, VR 60's  ASSESSMENT AND PLAN:  Principal Problem:   Atrial flutter Active Problems:   Near syncope   HTN (hypertension)   Dyslipidemia   PLAN: Admit Heparinize, he will need TEE CV. I discussed timing with Rosann Auerbach- unable to be done till Monday, monitor over the weekend.    Deland Pretty, PA-C 04/25/2013, 4:06 PM  Agree with note by Corine Shelter PAC. Pt admitted with Aflutter with 4:1 conduction and pre syncope. Sx are new within last 3-4 days. He has a H/O HTN, HLD but no prior cardiac history. Exam benign. He had a GXT late last year by his PCP in [place] which was nl. He will require TEE DCCV. Start IV hep. Step down.   Runell Gess, M.D., FACP, [facility], [facility] [address] [place], [place]   [address]  [phone] 04/25/2013 4:41 PM

## 2013-04-25 NOTE — Progress Notes (Signed)
ANTICOAGULATION CONSULT NOTE - Initial Consult  Pharmacy Consult for heparin Indication: atrial fibrillation  Allergies  Allergen Reactions  . Penicillins     At the injection site.    Patient Measurements: Height: 5\' 9"  (175.3 cm) Weight: 193 lb (87.544 kg) IBW/kg (Calculated) : 70.7 Heparin Dosing Weight: 87.5kg  Vital Signs: Temp: 97.6 F (36.4 C) (03/20 1340) Temp src: Axillary (03/20 1340) BP: 159/83 mmHg (03/20 1508) Pulse Rate: 57 (03/20 1508)  Labs:  Recent Labs  04/25/13 1350  HGB 16.7  HCT 45.9  PLT 216  CREATININE 1.06    Estimated Creatinine Clearance: 63.9 ml/min (by C-G formula based on Cr of 1.06).   Medical History: Past Medical History  Diagnosis Date  . Hypertension   . Dyslipidemia     Medications:  Infusions:  . sodium chloride    . heparin    . heparin      Assessment: [age] yom presented to the ED after syncopal episodes and found to be in aflutter. To start IV heparin for anticoagulation. H/H 16.7/45.9, plts 216. He is not on any anticoagulation PTA.  Goal of Therapy:  Heparin level 0.3-0.7 units/ml Monitor platelets by anticoagulation protocol: Yes   Plan:  1. Heparin bolus 4500 units IV x 1 2. Heparin gtt 1200 units/hr 3. Check an 8 hour heparin level 4. Daily heparin level and CBC 5. F/u plans for oral anticoagulation  Isaiah Murphy, Isaiah Murphy 04/25/2013,4:24 PM

## 2013-04-25 NOTE — ED Provider Notes (Signed)
CSN: [id number]     Arrival date & time 04/25/13  1331 History   First MD Initiated Contact with Patient 04/25/13 1458     Chief Complaint  Patient presents with  . Atrial Flutter     (Consider location/radiation/quality/duration/timing/severity/associated sxs/prior Treatment) HPI  This is a 79 year old male with a history of hypertension and "irregular heartbeat" who presents with palpitations. Patient reports palpitations since Tuesday.  He denies any chest pain or shortness of breath. He does report multiple episodes of near syncope where everything got dark. He states "I just didn't feel right." Patient denies any symptoms at this time including palpitations, chest pain, or shortness of breath. He was evaluated at urgent care about to be in atrial flutter. He is about to go on vacation and was sent to the emergency room for further evaluation. Medications include Norvasc. He is not on any aspirin or anticoagulants. No known history of heart disease.  Past Medical History  Diagnosis Date  . Hypertension   . Dyslipidemia    Past Surgical History  Procedure Laterality Date  . Hemorrhoid surgery     Family History  Problem Relation Age of Onset  . Hypertension Father    History  Substance Use Topics  . Smoking status: Never Smoker   . Smokeless tobacco: Not on file  . Alcohol Use: No    Review of Systems  Constitutional: Negative.  Negative for fever.  Respiratory: Negative.  Negative for chest tightness and shortness of breath.   Cardiovascular: Positive for palpitations. Negative for chest pain and leg swelling.  Gastrointestinal: Negative.  Negative for abdominal pain.  Genitourinary: Negative.  Negative for dysuria.  Musculoskeletal: Negative for back pain.  Skin: Negative for rash.  Neurological: Negative for dizziness, seizures, syncope, weakness, numbness and headaches.       Near syncope  All other systems reviewed and are negative.      Allergies   Penicillins  Home Medications   Current Outpatient Rx  Name  Route  Sig  Dispense  Refill  . albuterol (PROVENTIL HFA;VENTOLIN HFA) 108 (90 BASE) MCG/ACT inhaler   Inhalation   Inhale into the lungs every 6 (six) hours as needed for wheezing or shortness of breath.         Marland Kitchen. amLODipine (NORVASC) 10 MG tablet   Oral   Take 10 mg by mouth daily.         Marland Kitchen. atorvastatin (LIPITOR) 20 MG tablet   Oral   Take 20 mg by mouth daily.         . ciclesonide (ALVESCO) 160 MCG/ACT inhaler   Inhalation   Inhale 2 puffs into the lungs 2 (two) times daily.         Marland Kitchen. esomeprazole (NEXIUM) 40 MG capsule   Oral   Take 40 mg by mouth daily at 12 noon.         . hydrochlorothiazide (HYDRODIURIL) 25 MG tablet   Oral   Take 25 mg by mouth every evening.         Marland Kitchen. lisinopril (PRINIVIL,ZESTRIL) 40 MG tablet   Oral   Take 40 mg by mouth 2 (two) times daily.         . montelukast (SINGULAIR) 10 MG tablet   Oral   Take 10 mg by mouth at bedtime.         . Multiple Vitamins-Minerals (MEGA MULTI MEN PO)   Oral   Take 1 tablet by mouth daily.         .Marland Kitchen  triamcinolone cream (KENALOG) 0.1 %   Topical   Apply 1 application topically 2 (two) times daily as needed (itching).          BP 159/83  Pulse 57  Temp(Src) 97.6 F (36.4 C) (Axillary)  Resp 15  Ht 5\' 9"  (1.753 m)  Wt 193 lb (87.544 kg)  BMI 28.49 kg/m2  SpO2 99% Physical Exam  Nursing note and vitals reviewed. Constitutional: He is oriented to person, place, and time. He appears well-developed and well-nourished. No distress.  HENT:  Head: Normocephalic and atraumatic.  Eyes: Pupils are equal, round, and reactive to light.  Neck: Neck supple.  Cardiovascular: Normal rate, regular rhythm and normal heart sounds.   No murmur heard. Pulmonary/Chest: Effort normal and breath sounds normal. No respiratory distress. He has no wheezes. He has no rales.  Abdominal: Soft. Bowel sounds are normal. There is no  tenderness. There is no rebound.  Musculoskeletal: He exhibits edema.  Trace bilateral lower extremity edema  Lymphadenopathy:    He has no cervical adenopathy.  Neurological: He is alert and oriented to person, place, and time.  Skin: Skin is warm and dry.  Psychiatric: He has a normal mood and affect.    ED Course  Procedures (including critical care time) Labs Review Labs Reviewed  CBC - Abnormal; Notable for the following:    MCHC 36.4 (*)    All other components within normal limits  BASIC METABOLIC PANEL - Abnormal; Notable for the following:    GFR calc non Af Amer 66 (*)    GFR calc Af Amer 76 (*)    All other components within normal limits  TSH  HEPARIN LEVEL (UNFRACTIONATED)  I-STAT TROPOININ, ED   Imaging Review No results found.   EKG Interpretation   Date/Time:  Friday April 25 2013 13:36:42 EDT Ventricular Rate:  66 PR Interval:    QRS Duration: 94 QT Interval:  430 QTC Calculation: 450 R Axis:   -51 Text Interpretation:  Atrial flutter with 4:1 A-V conduction Left anterior  fascicular block ST \\T \ T wave abnormality, consider inferior ischemia ST  \T\ T wave abnormality, consider anterolateral ischemia Abnormal ECG No  prior for comparison Confirmed by HORTON  MD, Toni Amend ([id number]) on  04/25/2013 3:03:15 PM      MDM   Final diagnoses:  Atrial flutter    Patient presents with palpitations. Found to be in atrial flutter. Unknown if this is a new heart rhythm for him as he reports a history of "irregular heartbeat." He is nontoxic and is currently asymptomatic. Cardiology to see the patient per Dr. Katrinka Blazing. They will admit the patient for cardioversion.    Shon Baton, MD 04/25/13 [phone]

## 2013-04-26 LAB — BASIC METABOLIC PANEL
BUN: 11 mg/dL (ref 6–23)
CO2: 23 mEq/L (ref 19–32)
Calcium: 8.7 mg/dL (ref 8.4–10.5)
Chloride: 101 mEq/L (ref 96–112)
Creatinine, Ser: 1.06 mg/dL (ref 0.50–1.35)
GFR calc Af Amer: 76 mL/min — ABNORMAL LOW (ref >90)
GFR calc non Af Amer: 66 mL/min — ABNORMAL LOW (ref >90)
Glucose, Bld: 101 mg/dL — ABNORMAL HIGH (ref 70–99)
Potassium: 4.2 mEq/L (ref 3.7–5.3)
Sodium: 137 mEq/L (ref 137–147)

## 2013-04-26 LAB — CBC
HCT: 43.3 % (ref 39.0–52.0)
HEMOGLOBIN: 15.7 g/dL (ref 13.0–17.0)
MCH: 33.7 pg (ref 26.0–34.0)
MCHC: 36.3 g/dL — ABNORMAL HIGH (ref 30.0–36.0)
MCV: 92.9 fL (ref 78.0–100.0)
Platelets: 192 10*3/uL (ref 150–400)
RBC: 4.66 MIL/uL (ref 4.22–5.81)
RDW: 12.3 % (ref 11.5–15.5)
WBC: 9.7 10*3/uL (ref 4.0–10.5)

## 2013-04-26 LAB — LIPID PANEL
Cholesterol: 108 mg/dL (ref 0–200)
HDL: 36 mg/dL — ABNORMAL LOW (ref >39)
LDL Cholesterol: 62 mg/dL (ref 0–99)
Total CHOL/HDL Ratio: 3 RATIO
Triglycerides: 52 mg/dL (ref <150)
VLDL: 10 mg/dL (ref 0–40)

## 2013-04-26 LAB — MAGNESIUM: Magnesium: 2 mg/dL (ref 1.5–2.5)

## 2013-04-26 LAB — TSH: TSH: 2.501 u[IU]/mL (ref 0.350–4.500)

## 2013-04-26 LAB — HEPARIN LEVEL (UNFRACTIONATED): HEPARIN UNFRACTIONATED: 0.66 [IU]/mL (ref 0.30–0.70)

## 2013-04-26 LAB — MRSA PCR SCREENING: MRSA by PCR: POSITIVE — AB

## 2013-04-26 MED ORDER — HEPARIN (PORCINE) IN NACL 100-0.45 UNIT/ML-% IJ SOLN
1200.0000 [IU]/h | INTRAMUSCULAR | Status: DC
  Administered 2013-04-26: 1200 [IU]/h via INTRAVENOUS
  Filled 2013-04-26: qty 250

## 2013-04-26 MED ORDER — MUPIROCIN 2 % EX OINT
1.0000 "application " | TOPICAL_OINTMENT | Freq: Two times a day (BID) | CUTANEOUS | Status: DC
  Administered 2013-04-26 – 2013-04-29 (×6): 1 via NASAL
  Filled 2013-04-26 (×2): qty 22

## 2013-04-26 MED ORDER — RIVAROXABAN 20 MG PO TABS
20.0000 mg | ORAL_TABLET | Freq: Every day | ORAL | Status: DC
  Administered 2013-04-26: 20 mg via ORAL
  Filled 2013-04-26: qty 1

## 2013-04-26 MED ORDER — MAGNESIUM HYDROXIDE 400 MG/5ML PO SUSP: 30.0000 mL | Freq: Every day | ORAL | Status: DC | PRN

## 2013-04-26 MED ORDER — SODIUM CHLORIDE 0.9 % IV SOLN
INTRAVENOUS | Status: DC
  Administered 2013-04-26: 23:00:00 via INTRAVENOUS

## 2013-04-26 MED ORDER — CHLORHEXIDINE GLUCONATE CLOTH 2 % EX PADS
6.0000 | MEDICATED_PAD | Freq: Every day | CUTANEOUS | Status: DC
  Administered 2013-04-27 – 2013-04-29 (×3): 6 via TOPICAL

## 2013-04-26 NOTE — Progress Notes (Signed)
Patient had some longer pauses earlier associated with dizziness.  I moved him to the stepdown for closer monitoring.  Don't feel necessary to put a temp wire in at present as cardioversion may resolve these pauses. I will not start Xarelto tonight but keep on heparin in case something needed over the weekend.   Darden PalmerW. Spencer Tilley, Jr. MD [facility]

## 2013-04-26 NOTE — Progress Notes (Signed)
ANTICOAGULATION CONSULT NOTE - Follow Up Consult  Pharmacy Consult for Heparin  Indication: atrial fibrillation  Allergies  Allergen Reactions  . Penicillins     At the injection site.    Patient Measurements: Height: 5\' 9"  (175.3 cm) Weight: 193 lb (87.544 kg) IBW/kg (Calculated) : 70.7  Vital Signs: Temp: 97.8 F (36.6 C) (03/20 2110) Temp src: Oral (03/20 2110) BP: 147/92 mmHg (03/20 2136) Pulse Rate: 66 (03/20 2110)  Labs:  Recent Labs  04/25/13 1350 04/26/13 0100  HGB 16.7 15.7  HCT 45.9 43.3  PLT 216 192  HEPARINUNFRC  --  0.66  CREATININE 1.06  --     Estimated Creatinine Clearance: 63.9 ml/min (by C-G formula based on Cr of 1.06).   Medications:  Heparin 1200 units/hr  Assessment: 79 y/o M on heparin for aflutter. First HL is 0.66. Other labs as above.   Goal of Therapy:  Heparin level 0.3-0.7 units/ml Monitor platelets by anticoagulation protocol: Yes   Plan:  -Continue heparin at 1200 units/hr -1000 HL to confirm -Daily CBC/HL -Monitor for bleeding  Abran DukeLedford, Aradhana Gin 04/26/2013,1:55 AM

## 2013-04-26 NOTE — Progress Notes (Signed)
ANTICOAGULATION CONSULT NOTE - Follow Up Consult  Pharmacy Consult for Heparin > Rivaroxaban > Heparin Indication: atrial fibrillation  Allergies  Allergen Reactions  . Penicillins     At the injection site.   Patient Measurements: Height: 5\' 9"  (175.3 cm) Weight: 193 lb (87.544 kg) IBW/kg (Calculated) : 70.7  Vital Signs: Temp: 97.5 F (36.4 C) (03/21 1550) Temp src: Oral (03/21 1550) BP: 125/85 mmHg (03/21 1550) Pulse Rate: 67 (03/21 1550)  Labs:  Recent Labs  04/25/13 1350 04/26/13 0100  HGB 16.7 15.7  HCT 45.9 43.3  PLT 216 192  HEPARINUNFRC  --  0.66  CREATININE 1.06 1.06   Estimated Creatinine Clearance: 63.9 ml/min (by C-G formula based on Cr of 1.06).  Medications:  Heparin 1200 units/hr > Rivaroxaban 20mg  x 1 > Heparin  Assessment: 79 y/o M on heparin for aflutter now to change to xarelto. SCr= 1.06 and CrCl ~ 65.  Heparin was stopped and he received one dose of rivaroxaban at ~ 12N today.  He has an estimated crcl of 64 ml/min.  We have been asked to resume his IV heparin due to so ongoing cardiac issues and plans for further work-up.  His rivaroxaban has now been discontinued.  Goal of Therapy:  Heparin level 0.3-0.7 units/ml Monitor platelets by anticoagulation protocol: Yes   Plan:  -  Restart IV Heparin at 1200 units/hr without a bolus at 23:30PM. -  Check aPTT and heparin level with AM labs -  F/U for bleeding complications  Nadara MustardNita Zeeva Courser, PharmD., MS Clinical Pharmacist Pager:  [phone] Thank you for allowing pharmacy to be part of this patients care team. 04/26/2013 4:08 PM

## 2013-04-26 NOTE — Progress Notes (Signed)
ANTICOAGULATION CONSULT NOTE - Follow Up Consult  Pharmacy Consult for Heparin  Indication: atrial fibrillation  Allergies  Allergen Reactions  . Penicillins     At the injection site.    Patient Measurements: Height: 5\' 9"  (175.3 cm) Weight: 193 lb (87.544 kg) IBW/kg (Calculated) : 70.7  Vital Signs: Temp: 98 F (36.7 C) (03/21 0900) Temp src: Oral (03/21 0900) BP: 124/76 mmHg (03/21 0900) Pulse Rate: 67 (03/21 0900)  Labs:  Recent Labs  04/25/13 1350 04/26/13 0100  HGB 16.7 15.7  HCT 45.9 43.3  PLT 216 192  HEPARINUNFRC  --  0.66  CREATININE 1.06 1.06    Estimated Creatinine Clearance: 63.9 ml/min (by C-G formula based on Cr of 1.06).   Medications:  Heparin 1200 units/hr  Assessment: 79 y/o M on heparin for aflutter now to change to xarelto. SCr= 1.06 and CrCl ~ 65.   Goal of Therapy:  Heparin level 0.3-0.7 units/ml Monitor platelets by anticoagulation protocol: Yes   Plan:  -Xarelto 20mg  with lunch today and then daily with dinner -Discontinue heparin when xarelto started -Will provide patient education  Harland Germanndrew Joe Gee, Pharm D 04/26/2013 10:22 AM

## 2013-04-26 NOTE — Progress Notes (Signed)
Pt had 2 pauses between 3:30-3:50 - 5.2 and 4.9 seconds.Remains in a-flutter - these were not post-conversion pauses. Rates 80s at present time. Was symptomatic with some lightheadedness, felt like he was going to black out. This is what he had been feeling at home - he feels that if he tenses up his muscles and shakes his head he can break out of it. BP stable at 125/85. Not on any AV nodal blocking agents. D/w Dr. Donnie Ahoilley - transfer to stepdown, pacer to bedside, close observation, DC Xarelto order and continue heparin per pharmacy (called main pharmacy to notify them of the change). Charge nurse will be contacting tele dept to see if they can find out how to print strips. Kol Consuegra PA-C

## 2013-04-26 NOTE — Progress Notes (Signed)
Subjective:  Feels well, no SOB or Chest pain.  Objective:  Vital Signs in the last 24 hours: BP 124/76  Pulse 67  Temp(Src) 97.8 F (36.6 C) (Oral)  Resp 18  Ht 5\' 9"  (1.753 m)  Wt 87.544 kg (193 lb)  BMI 28.49 kg/m2  SpO2 100%  Physical Exam: Pleasant BM in NAD Lungs:  Clear Cardiac:  Regular rhythm, normal S1 and S2, no S3 Extremities:  No edema present  Intake/Output from previous day: 03/20 0701 - 03/21 0700 In: 91 [I.V.:91] Out: 125 [Urine:125]  Weight Filed Weights   04/25/13 1340  Weight: 87.544 kg (193 lb)    Lab Results: Basic Metabolic Panel:  Recent Labs  16/11/9601/20/15 1350 04/26/13 0100  NA 137 137  K 4.7 4.2  CL 98 101  CO2 28 23  GLUCOSE 95 101*  BUN 11 11  CREATININE 1.06 1.06   CBC:  Recent Labs  04/25/13 1350 04/26/13 0100  WBC 8.2 9.7  HGB 16.7 15.7  HCT 45.9 43.3  MCV 92.9 92.9  PLT 216 192   Telemetry: Atrial flutter with controlled response  Assessment/Plan:  1. New onset of atrial flutter  Rec:  TEE Cardioversion Monday.  Switch over to Xarelto.  Darden PalmerW. Spencer Tilley, Jr.  MD [facility] Cardiology  04/26/2013, 9:56 AM

## 2013-04-26 NOTE — Progress Notes (Signed)
15:34 4.6 sec pause. 1546- 4.9 sec pause Dayna notified, pacer pads applied

## 2013-04-27 LAB — CBC
HEMATOCRIT: 41.5 % (ref 39.0–52.0)
Hemoglobin: 15.3 g/dL (ref 13.0–17.0)
MCH: 33.6 pg (ref 26.0–34.0)
MCHC: 36.9 g/dL — ABNORMAL HIGH (ref 30.0–36.0)
MCV: 91.2 fL (ref 78.0–100.0)
PLATELETS: 227 10*3/uL (ref 150–400)
RBC: 4.55 MIL/uL (ref 4.22–5.81)
RDW: 12.1 % (ref 11.5–15.5)
WBC: 7.8 10*3/uL (ref 4.0–10.5)

## 2013-04-27 LAB — HEPARIN LEVEL (UNFRACTIONATED)
HEPARIN UNFRACTIONATED: 0.66 [IU]/mL (ref 0.30–0.70)
Heparin Unfractionated: >2.2 IU/mL — ABNORMAL HIGH (ref 0.30–0.70)

## 2013-04-27 LAB — APTT
APTT: 79 s — AB (ref 24–37)
aPTT: >200 seconds (ref 24–37)
aPTT: 36 seconds (ref 24–37)

## 2013-04-27 MED ORDER — ATROPINE SULFATE 0.1 MG/ML IJ SOLN
INTRAMUSCULAR | Status: AC
  Filled 2013-04-27: qty 10

## 2013-04-27 MED ORDER — HEPARIN (PORCINE) IN NACL 100-0.45 UNIT/ML-% IJ SOLN
1000.0000 [IU]/h | INTRAMUSCULAR | Status: DC
  Administered 2013-04-27: 1200 [IU]/h via INTRAVENOUS
  Filled 2013-04-27 (×2): qty 250

## 2013-04-27 MED ORDER — DOPAMINE-DEXTROSE 3.2-5 MG/ML-% IV SOLN
2.0000 ug/kg/min | INTRAVENOUS | Status: DC
  Filled 2013-04-27: qty 250

## 2013-04-27 NOTE — Progress Notes (Signed)
Called by RN, RE: more frequent transient pauses with mild dizziness. Longest 5-6 sec. Patient in A flutter with slow ventricular response and is plan for TEE DCCV which will likely resolve the issue. He is not on any AVB agents.  Instructed pacer pads, atropine at bedside.   Dopamine ordered to bedside if longer pauses or syncope.  If worsens, will start Dopamine or consider TVP.  Haydee SalterYan Seri Kimmer, MD

## 2013-04-27 NOTE — Progress Notes (Signed)
ANTICOAGULATION CONSULT NOTE - Follow Up Consult  Pharmacy Consult for Heparin  Indication: atrial fibrillation  Allergies  Allergen Reactions  . Penicillins     At the injection site.   Patient Measurements: Height: 5\' 9"  (175.3 cm) Weight: 193 lb (87.544 kg) IBW/kg (Calculated) : 70.7  Vital Signs: Temp: 98.1 F (36.7 C) (03/22 0000) Temp src: Oral (03/22 0000) BP: 112/64 mmHg (03/22 0000) Pulse Rate: 67 (03/22 0000)  Labs:  Recent Labs  04/25/13 1350 04/26/13 0100 04/27/13 0253  HGB 16.7 15.7 15.3  HCT 45.9 43.3 41.5  PLT 216 192 227  APTT  --   --  >200*  HEPARINUNFRC  --  0.66 >2.20*  CREATININE 1.06 1.06  --    Estimated Creatinine Clearance: 63.9 ml/min (by C-G formula based on Cr of 1.06).  Assessment: 79 y/o with Aflutter for anticoagulation.  Received Xarelto 20 mg at noon yesterday.  Heparin level elevated this morning due to recent Xarelto  Goal of Therapy:  Heparin level 0.3-0.7 units/ml Monitor platelets by anticoagulation protocol: Yes   Plan:  Will hold heparin for now and resume at noon (24 hrs after last dose) Check baseline heparin level and PTT at 1100 prior to starting heparin APTT 8 hrs after starting infusion.  Geannie RisenGreg Mischele Detter, PharmD, BCPS  04/27/2013 5:17 AM

## 2013-04-27 NOTE — Progress Notes (Signed)
ANTICOAGULATION CONSULT NOTE - Follow Up Consult  Pharmacy Consult for Heparin Indication:  AFib  Allergies  Allergen Reactions  . Penicillins     At the injection site.    Patient Measurements: Height: 5\' 9"  (175.3 cm) Weight: 193 lb (87.544 kg) IBW/kg (Calculated) : 70.7 Heparin Dosing Weight:   Vital Signs: Temp: 97.2 F (36.2 C) (03/22 2000) Temp src: Oral (03/22 2000) BP: 134/84 mmHg (03/22 2000) Pulse Rate: 67 (03/22 2000)  Labs:  Recent Labs  04/25/13 1350 04/26/13 0100 04/27/13 0253 04/27/13 1045 04/27/13 1955  HGB 16.7 15.7 15.3  --   --   HCT 45.9 43.3 41.5  --   --   PLT 216 192 227  --   --   APTT  --   --  >200* 36 79*  HEPARINUNFRC  --  0.66 >2.20* 0.66  --   CREATININE 1.06 1.06  --   --   --     Estimated Creatinine Clearance: 63.9 ml/min (by C-G formula based on Cr of 1.06).   Medications:  Scheduled:  . amLODipine  10 mg Oral Daily  . aspirin EC  81 mg Oral Daily  . atorvastatin  20 mg Oral Daily  . Chlorhexidine Gluconate Cloth  6 each Topical Q0600  . fluticasone  2 puff Inhalation BID  . hydrochlorothiazide  25 mg Oral QPM  . lisinopril  40 mg Oral BID  . montelukast  10 mg Oral QHS  . mupirocin ointment  1 application Nasal BID  . pantoprazole  40 mg Oral Daily  . sodium chloride  3 mL Intravenous Q12H    Assessment: 79yo male with AFib, on Heparin after stopping Xarelto with plans for TEE on Monday.  Currently dosing by PTT while Xarelto clears, which is within goal range at 79sec.  No bleeding noted.    Goal of Therapy:  aPTT 66-102 sec seconds Monitor platelets by anticoagulation protocol: Yes   Plan:  1-  Continue current rate 2-  Heparin level and aPTT in AM  Isaiah Murphy, PharmD Clinical Pharmacist [facility]

## 2013-04-27 NOTE — Progress Notes (Signed)
Subjective:   Had several 5-6 pauses last night. Mildly symptomatic. Dopa in room but not running. No pauses since 2am. Ventricular rate in 60s. Denies CP/SOB/palpitations.   Objective:  Vital Signs in the last 24 hours: BP 131/84  Pulse 67  Temp(Src) 98.1 F (36.7 C) (Oral)  Resp 14  Ht 5\' 9"  (1.753 m)  Wt 87.544 kg (193 lb)  BMI 28.49 kg/m2  SpO2 97%  Physical Exam: General:  Well appearing. No resp difficulty HEENT: normal Neck: supple. no JVD. Carotids 2+ bilat; no bruits. No lymphadenopathy or thryomegaly appreciated. Cor: PMI nondisplaced. Mildly irregular rhythm. No rubs, gallops or murmurs. Lungs: clear Abdomen: soft, nontender, nondistended. No hepatosplenomegaly. No bruits or masses. Good bowel sounds. Extremities: no cyanosis, clubbing, rash, edema Neuro: alert & orientedx3, cranial nerves grossly intact. moves all 4 extremities w/o difficulty. Affect pleasant   Intake/Output from previous day: 03/21 0701 - 03/22 0700 In: 641 [P.O.:480; I.V.:161] Out: 1160 [Urine:1160]  Weight Filed Weights   04/25/13 1340  Weight: 87.544 kg (193 lb)    Lab Results: Basic Metabolic Panel:  Recent Labs  16/11/9601/20/15 1350 04/26/13 0100  NA 137 137  K 4.7 4.2  CL 98 101  CO2 28 23  GLUCOSE 95 101*  BUN 11 11  CREATININE 1.06 1.06   CBC:  Recent Labs  04/26/13 0100 04/27/13 0253  WBC 9.7 7.8  HGB 15.7 15.3  HCT 43.3 41.5  MCV 92.9 91.2  PLT 192 227   Telemetry: Atrial flutter with controlled response several pauses between 3-6 second  Assessment/Plan:  1. New onset of atrial flutter 2. Symptomatic pauses  Overall doing well but is having some pauses off all AV nodal agents. Will continue to watch in SDU. If pauses get more frequent or longer will need TVP. Plan TEE/DC-CV tomorrow. Continue heparin for now with plan to transition to Xarelto.  Consider EP evaluation for curative ablation. Does not have chest wall echo on hospital chart but will have TEE  tomorrow.   Eliott Amparan,MD 9:17 AM

## 2013-04-27 NOTE — Progress Notes (Signed)
CRITICAL VALUE ALERT  Critical value received:  Heaprin >2.2, aPTT >200  Date of notification:  04/27/2013  Time of notification:  05010  Critical value read back:yes  Nurse who received alert:  Anselm LisMelvin kuffour  MD notified (1st page):  pharmacist greg  Time of first page:  0510  MD notified (2nd page):  Time of second page:  Responding MD:  Pharmacist Tammy SoursGreg  Time MD responded:  [phone]

## 2013-04-28 ENCOUNTER — Encounter (HOSPITAL_COMMUNITY): Payer: Self-pay | Admitting: Anesthesiology

## 2013-04-28 ENCOUNTER — Encounter (HOSPITAL_COMMUNITY)
Admission: EM | Disposition: A | Discharge status: Home / Self Care | Payer: Federal, State, Local not specified - PPO | Source: Home / Self Care | Attending: Cardiovascular Disease

## 2013-04-28 ENCOUNTER — Encounter (HOSPITAL_COMMUNITY): Payer: Federal, State, Local not specified - PPO | Admitting: Anesthesiology

## 2013-04-28 ENCOUNTER — Inpatient Hospital Stay (HOSPITAL_COMMUNITY): Payer: Federal, State, Local not specified - PPO | Admitting: Anesthesiology

## 2013-04-28 DIAGNOSIS — I4892 Unspecified atrial flutter: Secondary | ICD-10-CM

## 2013-04-28 DIAGNOSIS — I059 Rheumatic mitral valve disease, unspecified: Secondary | ICD-10-CM

## 2013-04-28 HISTORY — PX: TEE WITHOUT CARDIOVERSION: SHX5443

## 2013-04-28 HISTORY — PX: CARDIOVERSION: SHX1299

## 2013-04-28 LAB — APTT
APTT: 119 s — AB (ref 24–37)
aPTT: 106 seconds — ABNORMAL HIGH (ref 24–37)

## 2013-04-28 LAB — HEPARIN LEVEL (UNFRACTIONATED)
HEPARIN UNFRACTIONATED: 0.74 [IU]/mL — AB (ref 0.30–0.70)
Heparin Unfractionated: 0.75 IU/mL — ABNORMAL HIGH (ref 0.30–0.70)

## 2013-04-28 LAB — PRO B NATRIURETIC PEPTIDE: PRO B NATRI PEPTIDE: 27.4 pg/mL (ref 0–450)

## 2013-04-28 SURGERY — ECHOCARDIOGRAM, TRANSESOPHAGEAL
Anesthesia: Monitor Anesthesia Care

## 2013-04-28 MED ORDER — BUTAMBEN-TETRACAINE-BENZOCAINE 2-2-14 % EX AERO
INHALATION_SPRAY | CUTANEOUS | Status: DC | PRN
  Administered 2013-04-28: 1 via TOPICAL

## 2013-04-28 MED ORDER — LACTATED RINGERS IV SOLN
Freq: Once | INTRAVENOUS | Status: AC
Start: 2013-04-28 — End: 2013-04-28
  Administered 2013-04-28: 1000 mL via INTRAVENOUS

## 2013-04-28 MED ORDER — LIDOCAINE HCL (CARDIAC) 20 MG/ML IV SOLN
INTRAVENOUS | Status: DC | PRN
  Administered 2013-04-28: 40 mg via INTRAVENOUS

## 2013-04-28 MED ORDER — PROPOFOL INFUSION 10 MG/ML OPTIME
INTRAVENOUS | Status: DC | PRN
  Administered 2013-04-28: 75 ug/kg/min via INTRAVENOUS

## 2013-04-28 MED ORDER — PROPOFOL 10 MG/ML IV BOLUS
INTRAVENOUS | Status: DC | PRN
  Administered 2013-04-28: 30 mg via INTRAVENOUS

## 2013-04-28 MED ORDER — APIXABAN 5 MG PO TABS
5.0000 mg | ORAL_TABLET | Freq: Two times a day (BID) | ORAL | Status: DC
  Administered 2013-04-28 – 2013-04-29 (×2): 5 mg via ORAL
  Filled 2013-04-28 (×3): qty 1

## 2013-04-28 MED ORDER — LACTATED RINGERS IV SOLN
INTRAVENOUS | Status: DC | PRN
  Administered 2013-04-28: 11:00:00 via INTRAVENOUS

## 2013-04-28 MED ORDER — PHENYLEPHRINE HCL 10 MG/ML IJ SOLN
INTRAMUSCULAR | Status: DC | PRN
  Administered 2013-04-28: 80 ug via INTRAVENOUS
  Administered 2013-04-28: 40 ug via INTRAVENOUS

## 2013-04-28 NOTE — Progress Notes (Addendum)
       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 Duration of atrial flutter is unknown. He never had true syncope but multiple episodes of near syncope.  TELEMETRY:  Sinus bradycardia at 48-55 beats per minute post cardioversion Filed Vitals:   04/28/13 1310 04/28/13 1335 04/28/13 1400 04/28/13 1624  BP: 125/75 111/77 109/80 136/90  Pulse: 53 54 52 63  Temp:    97.4 F (36.3 C)  TempSrc:    Oral  Resp: 16 16 18 22   Height:      Weight:      SpO2: 95% 97% 96% 94%    Intake/Output Summary (Last 24 hours) at 04/28/13 1906 Last data filed at 04/28/13 1800  Gross per 24 hour  Intake 1417.73 ml  Output   1660 ml  Net -242.27 ml    LABS: Basic Metabolic Panel:  Recent Labs  16/11/9601/21/15 0100  NA 137  K 4.2  CL 101  CO2 23  GLUCOSE 101*  BUN 11  CREATININE 1.06  CALCIUM 8.7  MG 2.0   CBC:  Recent Labs  04/26/13 0100 04/27/13 0253  WBC 9.7 7.8  HGB 15.7 15.3  HCT 43.3 41.5  MCV 92.9 91.2  PLT 192 227  Fasting Lipid Panel:  Recent Labs  04/26/13 0100  CHOL 108  HDL 36*  LDLCALC 62  TRIG 52  CHOLHDL 3.0    Radiology/Studies:  No acute abnormality with mild hyperinflation  Physical Exam: Blood pressure 136/90, pulse 63, temperature 97.4 F (36.3 C), temperature source Oral, resp. rate 22, height 5\' 9"  (1.753 m), weight 193 lb (87.544 kg), SpO2 94.00%. Weight change:    S4 gallop. Clear lung fields. No edema  ASSESSMENT:  1. 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 AV conduction system disease, denoted by 4-1 AV block while in atrial flutter, now resolved post cardioversion.  3. Hypertension  4. Hyperlipidemia  5. Near syncope, resolved  Plan:  1. The patient will need a 30 day monitor to exclude recurrent atrial fib/flutter 2.  Anticoagulation therapy should be continued, possibly indefinitely 3. If monitor demonstrates excessive bradycardia or paroxysmal episodes of atrial flutter/fib, he may need to have permanent pacemaker therapy and antiarrhythmic therapy added. 4. He should be eligible for discharge in a.m. 5. We will need to have all information forwarded to [place]. He does not yet have a cardiologist at home.    Selinda EonSigned, SMITH III,HENRY W 04/28/2013, 7:06 PM

## 2013-04-28 NOTE — Anesthesia Preprocedure Evaluation (Signed)
Anesthesia Evaluation  Patient identified by MRN, date of birth, ID band Patient awake    Reviewed: Allergy & Precautions, H&P , NPO status , Patient's Chart, lab work & pertinent test results  Airway       Dental   Pulmonary          Cardiovascular hypertension, + dysrhythmias Atrial Fibrillation     Neuro/Psych    GI/Hepatic   Endo/Other    Renal/GU      Musculoskeletal   Abdominal   Peds  Hematology   Anesthesia Other Findings   Reproductive/Obstetrics                           Anesthesia Physical Anesthesia Plan  ASA: III  Anesthesia Plan: MAC and General   Post-op Pain Management:    Induction: Intravenous  Airway Management Planned: Mask and Simple Face Mask  Additional Equipment:   Intra-op Plan:   Post-operative Plan:   Informed Consent:   Plan Discussed with:   Anesthesia Plan Comments:         Anesthesia Quick Evaluation

## 2013-04-28 NOTE — Progress Notes (Signed)
ANTICOAGULATION CONSULT NOTE - Follow Up Consult  Pharmacy Consult for Heparin  Indication: atrial fibrillation  Allergies  Allergen Reactions  . Penicillins     At the injection site.    Patient Measurements: Height: 5\' 9"  (175.3 cm) Weight: 193 lb (87.544 kg) IBW/kg (Calculated) : 70.7  Vital Signs: Temp: 97.5 F (36.4 C) (03/23 0027) Temp src: Oral (03/23 0027) BP: 137/90 mmHg (03/23 0200) Pulse Rate: 67 (03/23 0200)  Labs:  Recent Labs  04/25/13 1350  04/26/13 0100  04/27/13 0253 04/27/13 1045 04/27/13 1955 04/28/13 0255  HGB 16.7  --  15.7  --  15.3  --   --   --   HCT 45.9  --  43.3  --  41.5  --   --   --   PLT 216  --  192  --  227  --   --   --   APTT  --   --   --   < > >200* 36 79* 119*  HEPARINUNFRC  --   < > 0.66  --  >2.20* 0.66  --  0.74*  CREATININE 1.06  --  1.06  --   --   --   --   --   < > = values in this interval not displayed.  Estimated Creatinine Clearance: 63.9 ml/min (by C-G formula based on Cr of 1.06).   Medications:  Heparin 1200 units/hr  Assessment: 79 y/o M on heparin for aflutter. HL 0.74, aPTT 119, close to correlating. Other labs as above.   Goal of Therapy:  Heparin level 0.3-0.7 units/ml Monitor platelets by anticoagulation protocol: Yes   Plan:  -Decrease heparin to 1100 units/hr -1300 aPTT/ HL -Daily CBC/HL/aPTT -Monitor for bleeding  Abran DukeLedford, Juelz Claar 04/28/2013,4:40 AM

## 2013-04-28 NOTE — Interval H&P Note (Signed)
History and Physical Interval Note:  04/28/2013 10:52 AM  Isaiah NoseArthur J Overbaugh  has presented today for surgery, with the diagnosis of afib  The various methods of treatment have been discussed with the patient and family. After consideration of risks, benefits and other options for treatment, the patient has consented to  Procedure(s): TRANSESOPHAGEAL ECHOCARDIOGRAM (TEE) (N/A) CARDIOVERSION (N/A) as a surgical intervention .  The patient's history has been reviewed, patient examined, no change in status, stable for surgery.  I have reviewed the patient's chart and labs.  Questions were answered to the patient's satisfaction.     Lars MassonNELSON, Selso Mannor H

## 2013-04-28 NOTE — Progress Notes (Signed)
  Echocardiogram Echocardiogram Transesophageal has been performed.  Isaiah Murphy 04/28/2013, 12:07 PM

## 2013-04-28 NOTE — CV Procedure (Signed)
Transesophageal Echocardiogram Note  Isaiah Murphy [id number] 1934/11/08  Procedure: Transesophageal Echocardiogram Indications: atrial flutter  Procedure Details Consent: Obtained Time Out: Verified patient identification, verified procedure, site/side was marked, verified correct patient position, special equipment/implants available, Radiology Safety Procedures followed,  medications/allergies/relevent history reviewed, required imaging and test results available.  Performed  Medications: Propofol 60 mg iv   Left Ventrical:  Moderate LVH, low normal LVEF 50%, no regionla wall motion abnormalities  Mitral Valve: structurally normal, mild MR  Aortic Valve: Normal, no AI or AS  Tricuspid Valve: trace TR  Pulmonic Valve: normal, no PR  Right ventricle: mildly dilated with mild dysfunction  Right atrium: moderately dilated  Left Atrium/ Left atrial appendage: LA at least moderately dilated, no thrombus in LA or LAA  Atrial septum: no PFO by color Doppler  Aorta: mild AS disease  Complications: No apparent complications Patient did tolerate procedure well.  Lars Masson, MD, [facility] 04/28/2013, 10:53 AM     Cardioversion Note  Isaiah Murphy [id number] 1934-07-10  Procedure: DC Cardioversion Indications: atrial flutter  Procedure Details Consent: Obtained Time Out: Verified patient identification, verified procedure, site/side was marked, verified correct patient position, special equipment/implants available, Radiology Safety Procedures followed,  medications/allergies/relevent history reviewed, required imaging and test results available.  Performed  The patient has been on adequate anticoagulation.  The patient received IV Propofol for sedation.  Synchronous cardioversion was performed at 150 joules.  The cardioversion was successful.   Complications: No apparent complications Patient did tolerate procedure well.   Lars Masson, MD,  [facility] 04/28/2013, 6:30 PM

## 2013-04-28 NOTE — H&P (View-Only) (Signed)
Subjective:   Had several 5-6 pauses last night. Mildly symptomatic. Dopa in room but not running. No pauses since 2am. Ventricular rate in 60s. Denies CP/SOB/palpitations.   Objective:  Vital Signs in the last 24 hours: BP 131/84  Pulse 67  Temp(Src) 98.1 F (36.7 C) (Oral)  Resp 14  Ht 5' 9" (1.753 m)  Wt 87.544 kg (193 lb)  BMI 28.49 kg/m2  SpO2 97%  Physical Exam: General:  Well appearing. No resp difficulty HEENT: normal Neck: supple. no JVD. Carotids 2+ bilat; no bruits. No lymphadenopathy or thryomegaly appreciated. Cor: PMI nondisplaced. Mildly irregular rhythm. No rubs, gallops or murmurs. Lungs: clear Abdomen: soft, nontender, nondistended. No hepatosplenomegaly. No bruits or masses. Good bowel sounds. Extremities: no cyanosis, clubbing, rash, edema Neuro: alert & orientedx3, cranial nerves grossly intact. moves all 4 extremities w/o difficulty. Affect pleasant   Intake/Output from previous day: 03/21 0701 - 03/22 0700 In: 641 [P.O.:480; I.V.:161] Out: 1160 [Urine:1160]  Weight Filed Weights   04/25/13 1340  Weight: 87.544 kg (193 lb)    Lab Results: Basic Metabolic Panel:  Recent Labs  04/25/13 1350 04/26/13 0100  NA 137 137  K 4.7 4.2  CL 98 101  CO2 28 23  GLUCOSE 95 101*  BUN 11 11  CREATININE 1.06 1.06   CBC:  Recent Labs  04/26/13 0100 04/27/13 0253  WBC 9.7 7.8  HGB 15.7 15.3  HCT 43.3 41.5  MCV 92.9 91.2  PLT 192 227   Telemetry: Atrial flutter with controlled response several pauses between 3-6 second  Assessment/Plan:  1. New onset of atrial flutter 2. Symptomatic pauses  Overall doing well but is having some pauses off all AV nodal agents. Will continue to watch in SDU. If pauses get more frequent or longer will need TVP. Plan TEE/DC-CV tomorrow. Continue heparin for now with plan to transition to Xarelto.  Consider EP evaluation for curative ablation. Does not have chest wall echo on hospital chart but will have TEE  tomorrow.   Daniel Bensimhon,MD 9:17 AM      

## 2013-04-28 NOTE — Progress Notes (Signed)
ANTICOAGULATION CONSULT NOTE - Initial Consult  Pharmacy Consult for apixaban Indication: atrial fibrillation  Allergies  Allergen Reactions  . Penicillins     At the injection site.    Patient Measurements: Height: 5\' 9"  (175.3 cm) Weight: 193 lb (87.544 kg) IBW/kg (Calculated) : 70.7 Heparin Dosing Weight:   Vital Signs: Temp: 97.4 F (36.3 C) (03/23 1624) Temp src: Oral (03/23 1624) BP: 136/90 mmHg (03/23 1624) Pulse Rate: 63 (03/23 1624)  Labs:  Recent Labs  04/26/13 0100  04/27/13 0253 04/27/13 1045 04/27/13 1955 04/28/13 0255 04/28/13 1545  HGB 15.7  --  15.3  --   --   --   --   HCT 43.3  --  41.5  --   --   --   --   PLT 192  --  227  --   --   --   --   APTT  --   < > >200* 36 79* 119* 106*  HEPARINUNFRC 0.66  --  >2.20* 0.66  --  0.74* 0.75*  CREATININE 1.06  --   --   --   --   --   --   < > = values in this interval not displayed.  Estimated Creatinine Clearance: 63.9 ml/min (by C-G formula based on Cr of 1.06).   Medical History: Past Medical History  Diagnosis Date  . Hypertension   . Dyslipidemia     Medications:  Scheduled:  . amLODipine  10 mg Oral Daily  . aspirin EC  81 mg Oral Daily  . atorvastatin  20 mg Oral Daily  . Chlorhexidine Gluconate Cloth  6 each Topical Q0600  . fluticasone  2 puff Inhalation BID  . hydrochlorothiazide  25 mg Oral QPM  . lisinopril  40 mg Oral BID  . montelukast  10 mg Oral QHS  . mupirocin ointment  1 application Nasal BID  . pantoprazole  40 mg Oral Daily  . sodium chloride  3 mL Intravenous Q12H   Infusions:  . sodium chloride 10 mL/hr at 04/26/13 2325  . DOPamine      Assessment: 79 yo male with afib will be transitioned from heparin to apixaban.  Patient does not meet criteria for reduced dose because he is 79 yo, Wt 87.5 kg and SCr 1.06. Goal of Therapy:   Monitor platelets by anticoagulation protocol: Yes   Plan:  1) d/c heparin drip 2) Start apixaban 5 mg po bid  Demiah Gullickson,  Tsz-Yin 04/28/2013,7:33 PM

## 2013-04-28 NOTE — Progress Notes (Signed)
ANTICOAGULATION CONSULT NOTE - Follow Up Consult  Pharmacy Consult for heparin Indication: atrial fibrillation  Allergies  Allergen Reactions  . Penicillins     At the injection site.    Patient Measurements: Height: 5\' 9"  (175.3 cm) Weight: 193 lb (87.544 kg) IBW/kg (Calculated) : 70.7 Heparin Dosing Weight: 87 kg  Vital Signs: Temp: 97.4 F (36.3 C) (03/23 1624) Temp src: Oral (03/23 1624) BP: 136/90 mmHg (03/23 1624) Pulse Rate: 63 (03/23 1624)  Labs:  Recent Labs  04/26/13 0100  04/27/13 0253 04/27/13 1045 04/27/13 1955 04/28/13 0255  HGB 15.7  --  15.3  --   --   --   HCT 43.3  --  41.5  --   --   --   PLT 192  --  227  --   --   --   APTT  --   < > >200* 36 79* 119*  HEPARINUNFRC 0.66  --  >2.20* 0.66  --  0.74*  CREATININE 1.06  --   --   --   --   --   < > = values in this interval not displayed.  Estimated Creatinine Clearance: 63.9 ml/min (by C-G formula based on Cr of 1.06).   Medications:  Scheduled:  . amLODipine  10 mg Oral Daily  . aspirin EC  81 mg Oral Daily  . atorvastatin  20 mg Oral Daily  . Chlorhexidine Gluconate Cloth  6 each Topical Q0600  . fluticasone  2 puff Inhalation BID  . hydrochlorothiazide  25 mg Oral QPM  . lisinopril  40 mg Oral BID  . montelukast  10 mg Oral QHS  . mupirocin ointment  1 application Nasal BID  . pantoprazole  40 mg Oral Daily  . sodium chloride  3 mL Intravenous Q12H   Infusions:  . sodium chloride 10 mL/hr at 04/26/13 2325  . DOPamine    . heparin 1,100 Units/hr (04/28/13 0444)    Assessment: 79 yo male with afib is currently on subtherapeutic heparin.  Heparin level is 0.75 and aPTT is 106. Goal of Therapy:  Heparin level 0.3-0.7 units/ml; aPTT 66-102 seconds Monitor platelets by anticoagulation protocol: Yes   Plan:  1) Reduce heparin to 1000 units/hr and recheck heparin level in 8hrs  Jona Erkkila, Tsz-Yin 04/28/2013,4:33 PM

## 2013-04-28 NOTE — Anesthesia Postprocedure Evaluation (Signed)
  Anesthesia Post-op Note  Patient: Isaiah Murphy  Procedure(s) Performed: Procedure(s): TRANSESOPHAGEAL ECHOCARDIOGRAM (TEE) (N/A) CARDIOVERSION (N/A)  Patient Location: Endoscopy Unit  Anesthesia Type:MAC  Level of Consciousness: awake, alert  and patient cooperative  Airway and Oxygen Therapy: Patient Spontanous Breathing and Patient connected to nasal cannula oxygen  Post-op Pain: none  Post-op Assessment: Post-op Vital signs reviewed, Patient's Cardiovascular Status Stable, Respiratory Function Stable and Patent Airway  Post-op Vital Signs: Reviewed and stable  Complications: No apparent anesthesia complications

## 2013-04-28 NOTE — Care Management Note (Addendum)
    Page 1 of 1   04/29/2013     9:33:46 AM   CARE MANAGEMENT NOTE 04/29/2013  Patient:  Isaiah Murphy,Isaiah Murphy   Account Number:  [id number]  Date Initiated:  04/28/2013  Documentation initiated by:  Junius CreamerWELL,DEBBIE  Subjective/Objective Assessment:   adm w at flutter     Action/Plan:   lives w fam   Anticipated DC Date:  04/29/2013   Anticipated DC Plan:  HOME/SELF CARE      DC Planning Services  CM consult  Medication Assistance      Choice offered to / List presented to:             Status of service:   Medicare Important Message given?   (If response is "NO", the following Medicare IM given date fields will be blank) Date Medicare IM given:   Date Additional Medicare IM given:    Discharge Disposition:  HOME/SELF CARE  Per UR Regulation:  Reviewed for med. necessity/level of care/duration of stay  If discussed at Long Length of Stay Meetings, dates discussed:    Comments:  3/24  0932 debbie Isaiah Boger rn,bsn pt changed to eliquis. gave pt 30day free eliquis card and 10.00 per month copay card.  3/23 0914 debbie Isaiah Varone rn,bsn gave pt 30day free xarelto card and 5.00 per month copay card.

## 2013-04-28 NOTE — Transfer of Care (Signed)
Immediate Anesthesia Transfer of Care Note  Patient: Isaiah Murphy J Hanratty  Procedure(s) Performed: Procedure(s): TRANSESOPHAGEAL ECHOCARDIOGRAM (TEE) (N/A) CARDIOVERSION (N/A)  Patient Location: Endoscopy Unit  Anesthesia Type:MAC  Level of Consciousness: awake and patient cooperative  Airway & Oxygen Therapy: Patient Spontanous Breathing and Patient connected to nasal cannula oxygen  Post-op Assessment: Report given to PACU RN, Post -op Vital signs reviewed and stable and Patient moving all extremities  Post vital signs: Reviewed and stable  Complications: No apparent anesthesia complications

## 2013-04-29 ENCOUNTER — Encounter (HOSPITAL_COMMUNITY): Payer: Self-pay | Admitting: Cardiology

## 2013-04-29 LAB — CBC
HCT: 39.3 % (ref 39.0–52.0)
Hemoglobin: 14.2 g/dL (ref 13.0–17.0)
MCH: 33.5 pg (ref 26.0–34.0)
MCHC: 36.1 g/dL — ABNORMAL HIGH (ref 30.0–36.0)
MCV: 92.7 fL (ref 78.0–100.0)
Platelets: 219 10*3/uL (ref 150–400)
RBC: 4.24 MIL/uL (ref 4.22–5.81)
RDW: 12.2 % (ref 11.5–15.5)
WBC: 10.9 10*3/uL — ABNORMAL HIGH (ref 4.0–10.5)

## 2013-04-29 MED ORDER — APIXABAN 5 MG PO TABS: 5.0000 mg | ORAL_TABLET | Freq: Two times a day (BID) | ORAL | Status: DC

## 2013-04-29 MED ORDER — ASPIRIN 81 MG PO TBEC: 81.0000 mg | DELAYED_RELEASE_TABLET | Freq: Every day | ORAL | Status: DC

## 2013-04-29 NOTE — Discharge Summary (Signed)
Discharge Summary   Patient ID: THIERNO CAPRETTA MRN: [id number], DOB/AGE: [id number] 77 y.o. Admit date: 04/25/2013 D/C date:     04/29/2013  Primary Cardiologist: From [place]-- will establish care there Principal Problem:   Atrial flutter Active Problems:   Near syncope   HTN (hypertension)   Dyslipidemia    Admission Dates: 04/25/13 - 04/30/13 Discharge Diagnosis: New onset atrial flutter with symptomatic pauses. 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 male visiting [place] from [place] with a history of HTN and HLD, but no prior cardiac history. He was sent from an urgent care to [facility] with multiple episodes of pre-syncope and palpitations and was noted to be in Aflutter with 4:1 conduction.  He was admitted and started on IV heparin and scheduled for TEE DCCV, which could not be done until Monday. On telemetry he was noted to have pauses as long as 5-6 seconds long while remaining in a-flutter. He was symptomatic with these episodes and his symptoms were reminiscent of his pre-syncopal episodes which brought him to the hospital. Of note, he was not on any AV nodal blocking agents at the time. He was transfered to stepdown for close observation and a pacer and dopamine was brought to bedside. The patient remained stable and underwent successful TEE/DCCV on 04/28/13. No thrombi noted on transesophageal echo (full report seen below.) He had some sinus bradycardia post cardioversion, but is stable. He was converted from heparin to apixiban on 04/28/13.   ASSESSMENT:  1. Atrial flutter with 4-1 AV block, resolved after cardioversion. Duration unknown.  2. Intrinsic AV node conduction system disease  3. Suspect sinus node dysfunction/Tachy-Brady syndrome   PLAN:  1. Discharge home 2. He will need to be set up to wear a 30 day monitor once he returns to his home in [place]  3. He should remain on anticoagulation at least an  additional 3-4 weeks. He will need indefinite anticoagulation if any recurrence of atrial fib or flutter is noted on 30 day monitor.  4. He may need permanent pacemaker therapy if antiarrhythmic therapy is necessary to suppress recurrences of atrial arrhythmia or excessive bradycardias noted on a 30 day monitor.   The patient is recovering well. He has been seen by Dr. Katrinka Blazing today and deemed stable for discharge home with close follow up as an outpatient at home. No changes have been made to his home medical regimen with the exception of the addition of apixiban for anticoagulation (medications listed below.)  A copy of this discharge summary was given to the patient.    Discharge Vitals: Blood pressure 105/70, pulse 59, temperature 97.8 F (36.6 C), temperature source Oral, resp. rate 20, height 5\' 9"  (1.753 m), weight 193 lb (87.544 kg), SpO2 98.00%.  Labs: Lab Results  Component Value Date   WBC 10.9* 04/29/2013   HGB 14.2 04/29/2013   HCT 39.3 04/29/2013   MCV 92.7 04/29/2013   PLT 219 04/29/2013     Recent Labs Lab 04/26/13 0100  NA 137  K 4.2  CL 101  CO2 23  BUN 11  CREATININE 1.06  CALCIUM 8.7  GLUCOSE 101*    Lab Results  Component Value Date   CHOL 108 04/26/2013   HDL 36* 04/26/2013   LDLCALC 62 04/26/2013   TRIG 52 04/26/2013     Diagnostic Studies/Procedures   Dg Chest 2 View  04/26/2013   CLINICAL DATA:  Atrial flutter  EXAM: CHEST  2 VIEW  COMPARISON:  None.  FINDINGS: Numerous external wires overlie the thorax. Normal cardiac silhouette. No effusion, infiltrate, pneumothorax. Lungs are mildly hyperinflated.  IMPRESSION: Mildly hyperinflated lungs.  No acute findings.    Transesophageal Echocardiography Study Date: 04/28/2013 ------------------------------------------------------------ LV EF: 50% LV EF: 45% - 50% ------------------------------------------------------------ Indications: Atrial flutter  427.32. ------------------------------------------------------------ History: Risk factors: Hypertension. Dyslipidemia. ------------------------------------------------------------ Study Conclusions - Left ventricle: Wall thickness was increased in a pattern of moderate LVH. Systolic function was low normal with the estimated ejection fraction was 50%,. Wall motion was normal; there were no regional wall motion abnormalities. - Aortic valve: Structurally normal valve. Trileaflet; normal thickness leaflets. No significant regurgitation. - Aorta: Mild atherosclerotic disease in the descending thoracic aorta. - Mitral valve: Mild regurgitation. - Left atrium: No evidence of thrombus in the atrial cavity or appendage. No evidence of thrombus in the atrial cavity or appendage. No evidence of thrombus in the appendage. - Right ventricle: Systolic function was mildly reduced. - Right atrium: The atrium was moderately dilated. No evidence of thrombus in the atrial cavity or appendage. No evidence of thrombus in the appendage. - Atrial septum: No defect or patent foramen ovale was identified. Transesophageal echocardiography. 2D and color Doppler. Height: Height: 175.3cm. Height: 69in. Weight: Weight: 87.7kg. Weight: 193lb. Body mass index: BMI: 28.6kg/m^2. Body surface area: BSA: 2.58m^2. Blood pressure: 120/78. Patient status: Inpatient. Location: Endoscopy. ------------------------------------------------------------ Left ventricle: Wall thickness was increased in a pattern of moderate LVH. Systolic function was low normal with the estimated ejection fraction was 50%,. Wall motion was normal; there were no regional wall motion abnormalities. ------------------------------------------------------------ Aortic valve: Structurally normal valve. Trileaflet; normal thickness leaflets. Cusp separation was normal. Doppler: No significant  regurgitation. ------------------------------------------------------------ Aorta: Mild atherosclerotic disease in the descending thoracic aorta. There was no atheroma. There was no evidence for dissection. Aortic root: The aortic root was not dilated. Ascending aorta: The ascending aorta was normal in size. Aortic arch: The aortic arch was normal in size. Descending aorta: The descending aorta was normal in size. ------------------------------------------------------------ Mitral valve: Mildly thickened leaflets . Leaflet separation was normal. Doppler: Mild regurgitation. ------------------------------------------------------------ Left atrium: The atrium was was at least moderately dilated. No evidence of thrombus in the atrial cavity or appendage. No evidence of thrombus in the atrial cavity or appendage. No evidence of thrombus in the appendage. The appendage was morphologically a left appendage, multilobulated, and of normal size. Emptying velocity was normal. ------------------------------------------------------------ Atrial septum: No defect or patent foramen ovale was identified. ------------------------------------------------------------ Right ventricle: The cavity size was normal. Wall thickness was normal. Systolic function was mildly reduced. ------------------------------------------------------------ Pulmonic valve: Structurally normal valve. ------------------------------------------------------------ Tricuspid valve: Structurally normal valve. Leaflet separation was normal. Doppler: Mild regurgitation. ------------------------------------------------------------ Pulmonary artery: The main pulmonary artery was normal-sized. ------------------------------------------------------------ Right atrium: The atrium was moderately dilated. No evidence of thrombus in the atrial cavity or appendage. No evidence of thrombus in the appendage. The appendage  was morphologically a right appendage and normal sized. ------------------------------------------------------------ Pericardium: There was no pericardial effusion. ------------------------------------------------------------ Post procedure conclusions Ascending Aorta: - Mild atherosclerotic disease in the descending thoracic aorta.    Discharge Medications     Medication List         albuterol 108 (90 BASE) MCG/ACT inhaler  Commonly known as:  PROVENTIL HFA;VENTOLIN HFA  Inhale into the lungs every 6 (six) hours as needed for wheezing or shortness of breath.     amLODipine 10 MG tablet  Commonly known as:  NORVASC  Take 10  mg by mouth daily.     apixaban 5 MG Tabs tablet  Commonly known as:  ELIQUIS  Take 1 tablet (5 mg total) by mouth 2 (two) times daily.     aspirin 81 MG EC tablet  Take 1 tablet (81 mg total) by mouth daily.     atorvastatin 20 MG tablet  Commonly known as:  LIPITOR  Take 20 mg by mouth daily.     ciclesonide 160 MCG/ACT inhaler  Commonly known as:  ALVESCO  Inhale 2 puffs into the lungs 2 (two) times daily.     esomeprazole 40 MG capsule  Commonly known as:  NEXIUM  Take 40 mg by mouth daily at 12 noon.     hydrochlorothiazide 25 MG tablet  Commonly known as:  HYDRODIURIL  Take 25 mg by mouth every evening.     lisinopril 40 MG tablet  Commonly known as:  PRINIVIL,ZESTRIL  Take 40 mg by mouth 2 (two) times daily.     MEGA MULTI MEN PO  Take 1 tablet by mouth daily.     montelukast 10 MG tablet  Commonly known as:  SINGULAIR  Take 10 mg by mouth at bedtime.     triamcinolone cream 0.1 %  Commonly known as:  KENALOG  Apply 1 application topically 2 (two) times daily as needed (itching).        Disposition   The patient will be discharged in stable condition to home.  Follow-up Information   Follow up with PCP in [place]. (Please make an appointment with your PCP in [place] to refer you to a heart doctor to set up  a heart montitor. )         Duration of Discharge Encounter: Greater than 30 minutes including physician and PA time.  Signed, Venetia Maxon, Danna Casella PA-C 04/29/2013, 9:22 AM

## 2013-04-29 NOTE — Progress Notes (Signed)
       Patient Name: Isaiah Murphy Date of Encounter: 04/29/2013    SUBJECTIVE: Isaiah Murphy. No episodes of presyncope. No chest discomfort or dyspnea. He is able to lie flat. He denies palpitations.  TELEMETRY:  Sinus rhythm and sinus bradycardia throughout the night. No atrial arrhythmias noted. Filed Vitals:   04/28/13 2354 04/29/13 0012 04/29/13 0400 04/29/13 0614  BP: 107/68   95/69  Pulse:      Temp:  98.4 F (36.9 C) 98 F (36.7 C)   TempSrc:  Oral Oral   Resp: 21     Height:      Weight:      SpO2: 94%   97%    Intake/Output Summary (Last 24 hours) at 04/29/13 0758 Last data filed at 04/28/13 2030  Gross per 24 hour  Intake 1217.25 ml  Output    250 ml  Net 967.25 ml    LABS: Basic Metabolic Panel: No results found for this basename: NA, K, CL, CO2, GLUCOSE, BUN, CREATININE, CALCIUM, MG, PHOS,  in the last 72 hours CBC:  Recent Labs  04/27/13 0253 04/29/13 0230  WBC 7.8 10.9*  HGB 15.3 14.2  HCT 41.5 39.3  MCV 91.2 92.7  PLT 227 219   Radiology/Studies:  No abnormality noted on x-ray  Physical Exam: Blood pressure 95/69, pulse 59, temperature 98 F (36.7 C), temperature source Oral, resp. rate 21, height 5\' 9"  (1.753 m), weight 193 lb (87.544 kg), SpO2 97.00%. Weight change:    Chest is clear  Cardiac exam reveals no rub. An S4 gallop is audible.  No edema.  ASSESSMENT:  1. Atrial flutter with 4-1 AV block, resolved after cardioversion. Duration unknown. No thrombi noted on transesophageal echo. 2. Sinus bradycardia post cardioversion 3. Intrinsic AV node conduction system disease 4. Suspect sinus node dysfunction/Tachy-Brady syndrome  Plan:  1. Discharge today 2. Will need to be set up to wear a 30 day monitor once he returns to his home in [place] 3. She'll remain on anticoagulation at least an additional 3-4 weeks. He will need indefinite anticoagulation if any recurrence of atrial fib or flutter is noted  on 30 day monitor 4. He may need permanent pacemaker therapy if antiarrhythmic therapy is necessary to suppress recurrences of atrial arrhythmia or excessive bradycardias noted on a 30 day monitor.  Selinda EonSigned, SMITH III,HENRY W 04/29/2013, 7:58 AM

## 2013-04-29 NOTE — Progress Notes (Signed)
Pt discharged home. Morning medications given. Pt given copy of discharge summary and instructed to give PCP and call PCP for f/u appt upon returning to home. Pt instructed to pick up new medications on pharmacy listed on d/c instructions and ASA 81mg  will be OTC. All medications reviewed with patient and marked on d/c instructions which medications to take tonight and which to resume in the am so no duplication will be done with medications. Pt verbalizes understanding. Pt denies questions of any d/c instructions. Pt alert and oriented x3. Denies c/o CP, SOB, or any other difficulties.

## 2013-04-29 NOTE — Discharge Instructions (Signed)
Information on my medicine - ELIQUIS® (apixaban) ° °This medication education was reviewed with me or my healthcare representative as part of my discharge preparation.  The pharmacist that spoke with me during my hospital stay was:  Labron Bloodgood Sue, RPH ° °Why was Eliquis® prescribed for you? °Eliquis® was prescribed for you to reduce the risk of a blood clot forming that can cause a stroke if you have a medical condition called atrial fibrillation (a type of irregular heartbeat). ° °What do You need to know about Eliquis® ? °Take your Eliquis® TWICE DAILY - one tablet in the morning and one tablet in the evening with or without food. If you have difficulty swallowing the tablet whole please discuss with your pharmacist how to take the medication safely. ° °Take Eliquis® exactly as prescribed by your doctor and DO NOT stop taking Eliquis® without talking to the doctor who prescribed the medication.  Stopping may increase your risk of developing a stroke.  Refill your prescription before you run out. ° °After discharge, you should have regular check-up appointments with your healthcare provider that is prescribing your Eliquis®.  In the future your dose may need to be changed if your kidney function or weight changes by a significant amount or as you get older. ° °What do you do if you miss a dose? °If you miss a dose, take it as soon as you remember on the same day and resume taking twice daily.  Do not take more than one dose of ELIQUIS at the same time to make up a missed dose. ° °Important Safety Information °A possible side effect of Eliquis® is bleeding. You should call your healthcare provider right away if you experience any of the following: °  Bleeding from an injury or your nose that does not stop. °  Unusual colored urine (red or dark brown) or unusual colored stools (red or black). °  Unusual bruising for unknown reasons. °  A serious fall or if you hit your head (even if there is no  bleeding). ° °Some medicines may interact with Eliquis® and might increase your risk of bleeding or clotting while on Eliquis®. To help avoid this, consult your healthcare provider or pharmacist prior to using any new prescription or non-prescription medications, including herbals, vitamins, non-steroidal anti-inflammatory drugs (NSAIDs) and supplements. ° °This website has more information on Eliquis® (apixaban): www.Eliquis.com. °

## 2013-04-30 NOTE — Discharge Summary (Signed)
Please see my note from earlier this morning. Plan 30 day monitor and at least 3 weeks anticoagulation.. We will forward info to his PCP in [place] area.

## 2013-05-19 ENCOUNTER — Ambulatory Visit
Admission: RE | Admit: 2013-05-19 | Payer: BLUE CROSS/BLUE SHIELD | Source: Ambulatory Visit | Admitting: Cardiovascular Disease

## 2013-05-19 ENCOUNTER — Encounter: Admission: RE | Payer: Self-pay | Source: Ambulatory Visit

## 2013-05-19 SURGERY — PM IMPLANT DUAL
Anesthesia: Choice

## 2013-09-04 ENCOUNTER — Encounter: Payer: Self-pay | Admitting: Internal Medicine

## 2014-09-04 ENCOUNTER — Ambulatory Visit (INDEPENDENT_AMBULATORY_CARE_PROVIDER_SITE_OTHER): Payer: Self-pay

## 2015-01-15 ENCOUNTER — Ambulatory Visit (INDEPENDENT_AMBULATORY_CARE_PROVIDER_SITE_OTHER): Payer: Self-pay

## 2015-01-28 ENCOUNTER — Ambulatory Visit (INDEPENDENT_AMBULATORY_CARE_PROVIDER_SITE_OTHER): Payer: Self-pay | Admitting: Cardiology

## 2015-02-04 ENCOUNTER — Encounter: Payer: Self-pay | Admitting: Internal Medicine

## 2015-02-04 ENCOUNTER — Ambulatory Visit (INDEPENDENT_AMBULATORY_CARE_PROVIDER_SITE_OTHER): Payer: Federal, State, Local not specified - PPO | Admitting: Internal Medicine

## 2015-02-04 VITALS — BP 126/70 | HR 94 | Temp 98.4°F | Resp 16

## 2015-02-04 DIAGNOSIS — J45909 Unspecified asthma, uncomplicated: Secondary | ICD-10-CM | POA: Insufficient documentation

## 2015-02-04 DIAGNOSIS — J4541 Moderate persistent asthma with (acute) exacerbation: Secondary | ICD-10-CM

## 2015-02-04 DIAGNOSIS — J069 Acute upper respiratory infection, unspecified: Secondary | ICD-10-CM | POA: Diagnosis not present

## 2015-02-04 MED ORDER — HYDROCOD POLST-CPM POLST ER 10-8 MG/5ML PO SUER: 5.0000 mL | Freq: Two times a day (BID) | ORAL | Status: DC | PRN

## 2015-02-04 MED ORDER — CICLESONIDE 160 MCG/ACT IN AERS
INHALATION_SPRAY | RESPIRATORY_TRACT | Status: DC
Start: 2015-02-04 — End: 2015-07-23

## 2015-02-04 MED ORDER — AZITHROMYCIN 250 MG PO TABS: ORAL_TABLET | ORAL | Status: DC

## 2015-02-04 MED ORDER — ALBUTEROL SULFATE HFA 108 (90 BASE) MCG/ACT IN AERS: 2.0000 | INHALATION_SPRAY | RESPIRATORY_TRACT | Status: DC | PRN

## 2015-02-04 NOTE — Patient Instructions (Signed)
Asthma  Persistent, currently not well controlled due to infection  Given a Z-Pak  Continue Alvesco 160 g 2 puffs twice a day while sick. May decrease to 1 puff twice a day after you have been well for a few days  Continue Singulair 10 mg daily along with as needed albuterol   For cough, given Tussionex cough syrup. Take 5 ML every 12 hours as needed. Dispense 150 ML

## 2015-02-04 NOTE — Assessment & Plan Note (Signed)
   Persistent, currently not well controlled due to infection  Given a Z-Pak  Continue Alvesco 160 g 2 puffs twice a day while sick. May decrease to 1 puff twice a day after you have been well for a few days  Continue Singulair 10 mg daily along with as needed albuterol   For cough, given Tussionex cough syrup. Take 5 ML every 12 hours as needed. Dispense 150 ML

## 2015-02-04 NOTE — Progress Notes (Signed)
History of Present Illness: Isaiah Murphy is a 80 y.o. male presenting for a sick visit.  HPI Comments: Asthma: For the past week, patient has been having cough and congestion. He has been on 2 rounds of prednisone with only transient improvement in his symptoms. He was seen at an urgent care center and a chest x-ray was done that was reportedly negative. Normally he is well controlled on Alvesco and Singulair. He normally requires albuterol rarely     Assessment and Plan: Asthma  Persistent, currently not well controlled due to infection  Given a Z-Pak  Continue Alvesco 160 g 2 puffs twice a day while sick. May decrease to 1 puff twice a day after you have been well for a few days  Continue Singulair 10 mg daily along with as needed albuterol   For cough, given Tussionex cough syrup. Take 5 ML every 12 hours as needed. Dispense 150 ML    Return in about 4 weeks (around 03/04/2015).  Medications ordered this encounter:  Meds ordered this encounter  Medications  . DISCONTD: albuterol (PROAIR HFA) 108 (90 Base) MCG/ACT inhaler    Sig: Inhale 2 puffs into the lungs every 4 (four) hours as needed for wheezing or shortness of breath (cough).  . mometasone (NASONEX) 50 MCG/ACT nasal spray    Sig: 2 sprays in each nostril 1-2 times daily as needed  . OLOPATADINE HCL OP    Sig: Apply to eye. One drop in each eye 1-2 times daily as needed  . traMADol (ULTRAM) 50 MG tablet    Sig: 1-2 tablets every 6- 8 hours as needed  . chlorpheniramine-HYDROcodone (TUSSIONEX PENNKINETIC ER) 10-8 MG/5ML SUER    Sig: Take 5 mLs by mouth every 12 (twelve) hours as needed for cough.    Dispense:  150 mL    Refill:  0  . azithromycin (ZITHROMAX) 250 MG tablet    Sig: Take 2 tablets now, then take one tablet daily for four days.    Dispense:  6 each    Refill:  0  . albuterol (PROAIR HFA) 108 (90 Base) MCG/ACT inhaler    Sig: Inhale 2 puffs into the lungs every 4 (four) hours as needed for wheezing  or shortness of breath (cough).    Dispense:  1 Inhaler    Refill:  1  . ciclesonide (ALVESCO) 160 MCG/ACT inhaler    Sig: Use one puff twice daily to prevent cough or wheeze.  Increase to 2 puffs twice daily with asthma flare. Rinse, gargle and spit after use    Dispense:  1 Inhaler    Refill:  2    Diagnostics: Spirometry: FEV1 2.03L or 88%, FEV1/FVC  69%.  This is a normal study  Physical Exam: BP 126/70 mmHg  Pulse 94  Temp(Src) 98.4 F (36.9 C) (Oral)  Resp 16  SpO2 96%   Physical Exam  Constitutional: He appears well-developed.  HENT:  Nose: Nose normal.  Mouth/Throat: Oropharynx is clear and moist.  Eyes: Conjunctivae are normal.  Cardiovascular: Normal rate, regular rhythm and normal heart sounds.   No murmur heard. Pulmonary/Chest: Effort normal and breath sounds normal. No respiratory distress. He has no wheezes.  Abdominal: Soft. Bowel sounds are normal.  Musculoskeletal: He exhibits no edema.  Lymphadenopathy:    He has no cervical adenopathy.  Neurological: He is alert.  Skin: No rash noted.  Vitals reviewed.   Medications: Current outpatient prescriptions:  .  albuterol (PROAIR HFA) 108 (90 Base) MCG/ACT inhaler, Inhale  2 puffs into the lungs every 4 (four) hours as needed for wheezing or shortness of breath (cough)., Disp: 1 Inhaler, Rfl: 1 .  amLODipine (NORVASC) 10 MG tablet, Take 10 mg by mouth daily., Disp: , Rfl:  .  apixaban (ELIQUIS) 5 MG TABS tablet, Take 1 tablet (5 mg total) by mouth 2 (two) times daily., Disp: 60 tablet, Rfl: 0 .  atorvastatin (LIPITOR) 20 MG tablet, Take 20 mg by mouth daily., Disp: , Rfl:  .  ciclesonide (ALVESCO) 160 MCG/ACT inhaler, Use one puff twice daily to prevent cough or wheeze.  Increase to 2 puffs twice daily with asthma flare. Rinse, gargle and spit after use, Disp: 1 Inhaler, Rfl: 2 .  hydrochlorothiazide (HYDRODIURIL) 25 MG tablet, Take 25 mg by mouth every evening., Disp: , Rfl:  .  lisinopril (PRINIVIL,ZESTRIL)  40 MG tablet, Take 40 mg by mouth 2 (two) times daily., Disp: , Rfl:  .  mometasone (NASONEX) 50 MCG/ACT nasal spray, 2 sprays in each nostril 1-2 times daily as needed, Disp: , Rfl:  .  montelukast (SINGULAIR) 10 MG tablet, Take 10 mg by mouth at bedtime., Disp: , Rfl:  .  Multiple Vitamins-Minerals (MEGA MULTI MEN PO), Take 1 tablet by mouth daily., Disp: , Rfl:  .  OLOPATADINE HCL OP, Apply to eye. One drop in each eye 1-2 times daily as needed, Disp: , Rfl:  .  traMADol (ULTRAM) 50 MG tablet, 1-2 tablets every 6- 8 hours as needed, Disp: , Rfl:  .  albuterol (PROVENTIL HFA;VENTOLIN HFA) 108 (90 BASE) MCG/ACT inhaler, Inhale into the lungs every 6 (six) hours as needed for wheezing or shortness of breath. Reported on 02/04/2015, Disp: , Rfl:  .  aspirin EC 81 MG EC tablet, Take 1 tablet (81 mg total) by mouth daily. (Patient not taking: Reported on 02/04/2015), Disp: , Rfl:  .  azithromycin (ZITHROMAX) 250 MG tablet, Take 2 tablets now, then take one tablet daily for four days., Disp: 6 each, Rfl: 0 .  chlorpheniramine-HYDROcodone (TUSSIONEX PENNKINETIC ER) 10-8 MG/5ML SUER, Take 5 mLs by mouth every 12 (twelve) hours as needed for cough., Disp: 150 mL, Rfl: 0 .  esomeprazole (NEXIUM) 40 MG capsule, Take 40 mg by mouth daily at 12 noon. Reported on 02/04/2015, Disp: , Rfl:  .  triamcinolone cream (KENALOG) 0.1 %, Apply 1 application topically 2 (two) times daily as needed (itching). Reported on 02/04/2015, Disp: , Rfl:   Drug Allergies:  Allergies  Allergen Reactions  . Penicillins     At the injection site.    ROS: Per HPI unless specifically indicated below Review of Systems  Thank you for the opportunity to care for this patient.  Please do not hesitate to contact me with questions.

## 2015-02-09 ENCOUNTER — Ambulatory Visit: Payer: Self-pay | Admitting: Orthopedic Surgery

## 2015-02-09 NOTE — H&P (Signed)
Isaiah J Temme PHD DOB: 10/19/1934 Married / Language: English / Race: Black or African American Male  Date of Admission:  03/08/2015 CC:  Right Hip Pain History of Present Illness The patient is a 80 year old male who comes in for a preoperative History and Physical. The patient is scheduled for a right total hip arthroplasty (anterior) to be performed by Dr. Frank V. Aluisio, MD at [facility] on 03-08-2015. The patient reports right hip problems including pain symptoms that have been present for month(s). The symptoms began following a specific injury. The injury occured while the patient was while playing tennis. Symptoms reported include pain with weightbearing, night pain and stiffness The patient reports symptoms radiating to the: right groin, right thigh and right knee.The patient feels as if their symptoms are does feel they are worsening. Associated symptoms include weakness of the leg. Current treatment includes nonsteroidal anti-inflammatory drugs. Prior to being seen, the patient was previously evaluated by a colleague. Previous workup for this problem has included hip x-rays and hip MRI. Previous treatment for this problem has included physical therapy. Dr. Lampi has had problems with his right hip for quite a while now, but getting much worse in the past year. He is now having difficulty doing anything he desires. He feels like he has loss of motion in the hip. It is hurting him with most activities. Occasionally, it will hurt at rest or at night. He has had intraarticular injection with slight benefit. It did not last for long even if it did benefit him. He would like to get back to a more pain free lifestyle and back to being able to do things he desires. He is from [place], [place] and saw an orthopedist there and was told he had significant arthritis and needed hip replacement. He does spent a lot of time in [place] and has family here. He is wanting to get the hip fixed here.  They have been treated conservatively in the past for the above stated problem and despite conservative measures, they continue to have progressive pain and severe functional limitations and dysfunction. They have failed non-operative management including home exercise, medications, and injections. It is felt that they would benefit from undergoing total joint replacement. Risks and benefits of the procedure have been discussed with the patient and they elect to proceed with surgery. There are no active contraindications to surgery such as ongoing infection or rapidly progressive neurological disease.  Problem List/Past Medical Primary osteoarthritis of right hip (M16.11)  Asthma  Heart murmur  High blood pressure  Hypercholesterolemia  Sleep Apnea  Pacemaker  April 2015 Gastroesophageal Reflux Disease  Hemorrhoids  Allergies Penicillins  Rash. Rash at injection site  Family History Cerebrovascular Accident  Father. Hypertension  Father. Osteoarthritis  Father.  Social History Children  2 Current drinker  01/08/2015: Currently drinks beer and wine only occasionally per week Current work status  working full time Exercise  Exercises weekly Living situation  live with spouse Marital status  married No history of drug/alcohol rehab  Not under pain contract  Number of flights of stairs before winded  greater than 5 Tobacco / smoke exposure  01/08/2015: no Tobacco use  Never smoker. 01/08/2015  Medication History Alvesco (160MCG/ACT Aerosol Soln, Inhalation) Active. Eliquis (2.5MG Tablet, Oral two times daily) Active. NexIUM (20MG Capsule DR, Oral as needed) Active. HydroCHLOROthiazide (12.5MG Capsule, Oral daily) Active. Lisinopril (10MG Tablet, Oral two times daily) Active. Nasonex (50MCG/ACT Suspension, 1-2 puffs Nasal two times daily) Active.   Montelukast Sodium (10MG Tablet, Oral daily) Active.  Past Surgical History Hemorrhoidectomy   Pacemaker Placement  Date: 05/2013. Colonoscopy   Review of Systems General Not Present- Chills, Fatigue, Fever, Memory Loss, Night Sweats, Weight Gain and Weight Loss. Skin Present- Itching. Not Present- Eczema, Hives, Lesions and Rash. HEENT Not Present- Dentures, Double Vision, Headache, Hearing Loss, Tinnitus and Visual Loss. Respiratory Not Present- Allergies, Chronic Cough, Coughing up blood, Shortness of breath at rest and Shortness of breath with exertion. Cardiovascular Not Present- Chest Pain, Difficulty Breathing Lying Down, Murmur, Palpitations, Racing/skipping heartbeats and Swelling. Gastrointestinal Not Present- Abdominal Pain, Bloody Stool, Constipation, Diarrhea, Difficulty Swallowing, Heartburn, Jaundice, Loss of appetitie, Nausea and Vomiting. Male Genitourinary Not Present- Blood in Urine, Discharge, Flank Pain, Incontinence, Painful Urination, Urgency, Urinary frequency, Urinary Retention, Urinating at Night and Weak urinary stream. Musculoskeletal Present- Joint Pain. Not Present- Back Pain, Joint Swelling, Morning Stiffness, Muscle Pain, Muscle Weakness and Spasms. Neurological Not Present- Blackout spells, Difficulty with balance, Dizziness, Paralysis, Tremor and Weakness. Psychiatric Not Present- Insomnia.  Vitals Weight: 180 lb Height: 69in Body Surface Area: 1.98 m Body Mass Index: 26.58 kg/m  BP: 112/68 (Sitting, Right Arm, Standard)  Physical Exam General Mental Status -Alert, cooperative and good historian. General Appearance-pleasant, Not in acute distress. Orientation-Oriented X3. Build & Nutrition-Well nourished and Well developed.  Head and Neck Head-normocephalic, atraumatic . Neck Global Assessment - supple, no bruit auscultated on the right, no bruit auscultated on the left.  Eye Vision-Wears corrective lenses(wears readers). Pupil - Bilateral-Regular and Round. Motion - Bilateral-EOMI.  Chest and Lung  Exam Auscultation Breath sounds - clear at anterior chest wall and clear at posterior chest wall. Adventitious sounds - No Adventitious sounds.  Cardiovascular Auscultation Rhythm - Regular rate and rhythm. Heart Sounds - S1 WNL and S2 WNL. Murmurs & Other Heart Sounds - Auscultation of the heart reveals - No Murmurs.  Abdomen Palpation/Percussion Tenderness - Abdomen is non-tender to palpation. Rigidity (guarding) - Abdomen is soft. Auscultation Auscultation of the abdomen reveals - Bowel sounds normal.  Male Genitourinary Note: Not done, not pertinent to present illness   Musculoskeletal Note: He is alert and oriented, in no apparent distress. His left hip has normal range of motion with no discomfort. His right hip can be flexed to 100, minimal internal rotation, about 10 to 20 of external rotation, 20 degrees of abduction. There is no tenderness about the greater trochanter. His knee exam is normal bilaterally. Pulse, sensation and motor intact. He does have a slightly antalgic gait pattern on the right.  RADIOGRAPHS AP pelvis and lateral of the right hip shows significant bone-on-bone arthritis in the right hip with subchondral cystic formation. The left hip looks normal.   Assessment & Plan Primary osteoarthritis of right hip (M16.11)  Note:Surgical Plans: Right Total Hip Replacement - Anterior Approach  Disposition: Home versus Rehab  PCP: Dr. Antonio Parente - Patient has been seen preoperatively and felt to be stable for surgery.  IV TXA  Anesthesia Issues: None  Comment: Patient instructed to top the Eliquis three days before surgery.  Signed electronically by Alexzandrew L Perkins, III PA-C 

## 2015-02-09 NOTE — H&P (Signed)
Isaiah Nose PHD DOB: Jul 01, 1934 Married / Language: English / Race: Black or African American Male  Date of Admission:  03/08/2015 CC:  Right Hip Pain History of Present Illness The patient is a 81 year old male who comes in for a preoperative History and Physical. The patient is scheduled for a right total hip arthroplasty (anterior) to be performed by Dr. Gus Rankin. Aluisio, MD at [facility] on 03-08-2015. The patient reports right hip problems including pain symptoms that have been present for month(s). The symptoms began following a specific injury. The injury occured while the patient was while playing tennis. Symptoms reported include pain with weightbearing, night pain and stiffness The patient reports symptoms radiating to the: right groin, right thigh and right knee.The patient feels as if their symptoms are does feel they are worsening. Associated symptoms include weakness of the leg. Current treatment includes nonsteroidal anti-inflammatory drugs. Prior to being seen, the patient was previously evaluated by a colleague. Previous workup for this problem has included hip x-rays and hip MRI. Previous treatment for this problem has included physical therapy. Dr. Willa Rough has had problems with his right hip for quite a while now, but getting much worse in the past year. He is now having difficulty doing anything he desires. He feels like he has loss of motion in the hip. It is hurting him with most activities. Occasionally, it will hurt at rest or at night. He has had intraarticular injection with slight benefit. It did not last for long even if it did benefit him. He would like to get back to a more pain free lifestyle and back to being able to do things he desires. He is from [place], [place] and saw an orthopedist there and was told he had significant arthritis and needed hip replacement. He does spent a lot of time in [place] and has family here. He is wanting to get the hip fixed here.  They have been treated conservatively in the past for the above stated problem and despite conservative measures, they continue to have progressive pain and severe functional limitations and dysfunction. They have failed non-operative management including home exercise, medications, and injections. It is felt that they would benefit from undergoing total joint replacement. Risks and benefits of the procedure have been discussed with the patient and they elect to proceed with surgery. There are no active contraindications to surgery such as ongoing infection or rapidly progressive neurological disease.  Problem List/Past Medical Primary osteoarthritis of right hip (M16.11)  Asthma  Heart murmur  High blood pressure  Hypercholesterolemia  Sleep Apnea  Pacemaker  April 2015 Gastroesophageal Reflux Disease  Hemorrhoids  Allergies Penicillins  Rash. Rash at injection site  Family History Cerebrovascular Accident  Father. Hypertension  Father. Osteoarthritis  Father.  Social History Children  2 Current drinker  01/08/2015: Currently drinks beer and wine only occasionally per week Current work status  working full time Exercise  Exercises weekly Living situation  live with spouse Marital status  married No history of drug/alcohol rehab  Not under pain contract  Number of flights of stairs before winded  greater than 5 Tobacco / smoke exposure  01/08/2015: no Tobacco use  Never smoker. 01/08/2015  Medication History Alvesco (160MCG/ACT Aerosol Soln, Inhalation) Active. Eliquis (2.5MG  Tablet, Oral two times daily) Active. NexIUM (  Capsule DR, Oral as needed) Active. HydroCHLOROthiazide (12.5MG  Capsule, Oral daily) Active. Lisinopril (  Tablet, Oral two times daily) Active. Nasonex (50MCG/ACT Suspension, 1-2 puffs Nasal two times daily) Active.  Montelukast Sodium (  Tablet, Oral daily) Active.  Past Surgical History Hemorrhoidectomy   Pacemaker Placement  Date: 05/2013. Colonoscopy   Review of Systems General Not Present- Chills, Fatigue, Fever, Memory Loss, Night Sweats, Weight Gain and Weight Loss. Skin Present- Itching. Not Present- Eczema, Hives, Lesions and Rash. HEENT Not Present- Dentures, Double Vision, Headache, Hearing Loss, Tinnitus and Visual Loss. Respiratory Not Present- Allergies, Chronic Cough, Coughing up blood, Shortness of breath at rest and Shortness of breath with exertion. Cardiovascular Not Present- Chest Pain, Difficulty Breathing Lying Down, Murmur, Palpitations, Racing/skipping heartbeats and Swelling. Gastrointestinal Not Present- Abdominal Pain, Bloody Stool, Constipation, Diarrhea, Difficulty Swallowing, Heartburn, Jaundice, Loss of appetitie, Nausea and Vomiting. Male Genitourinary Not Present- Blood in Urine, Discharge, Flank Pain, Incontinence, Painful Urination, Urgency, Urinary frequency, Urinary Retention, Urinating at Night and Weak urinary stream. Musculoskeletal Present- Joint Pain. Not Present- Back Pain, Joint Swelling, Morning Stiffness, Muscle Pain, Muscle Weakness and Spasms. Neurological Not Present- Blackout spells, Difficulty with balance, Dizziness, Paralysis, Tremor and Weakness. Psychiatric Not Present- Insomnia.  Vitals Weight: 180 lb Height: 69in Body Surface Area: 1.98 m Body Mass Index: 26.58 kg/m  BP: 112/68 (Sitting, Right Arm, Standard)  Physical Exam General Mental Status -Alert, cooperative and good historian. General Appearance-pleasant, Not in acute distress. Orientation-Oriented X3. Build & Nutrition-Well nourished and Well developed.  Head and Neck Head-normocephalic, atraumatic . Neck Global Assessment - supple, no bruit auscultated on the right, no bruit auscultated on the left.  Eye Vision-Wears corrective lenses(wears readers). Pupil - Bilateral-Regular and Round. Motion - Bilateral-EOMI.  Chest and Lung  Exam Auscultation Breath sounds - clear at anterior chest wall and clear at posterior chest wall. Adventitious sounds - No Adventitious sounds.  Cardiovascular Auscultation Rhythm - Regular rate and rhythm. Heart Sounds - S1 WNL and S2 WNL. Murmurs & Other Heart Sounds - Auscultation of the heart reveals - No Murmurs.  Abdomen Palpation/Percussion Tenderness - Abdomen is non-tender to palpation. Rigidity (guarding) - Abdomen is soft. Auscultation Auscultation of the abdomen reveals - Bowel sounds normal.  Male Genitourinary Note: Not done, not pertinent to present illness   Musculoskeletal Note: He is alert and oriented, in no apparent distress. His left hip has normal range of motion with no discomfort. His right hip can be flexed to 100, minimal internal rotation, about 10 to 20 of external rotation, 20 degrees of abduction. There is no tenderness about the greater trochanter. His knee exam is normal bilaterally. Pulse, sensation and motor intact. He does have a slightly antalgic gait pattern on the right.  RADIOGRAPHS AP pelvis and lateral of the right hip shows significant bone-on-bone arthritis in the right hip with subchondral cystic formation. The left hip looks normal.   Assessment & Plan Primary osteoarthritis of right hip (M16.11)  Note:Surgical Plans: Right Total Hip Replacement - Anterior Approach  Disposition: Home versus Rehab  PCP: Dr. Alden Hipp - Patient has been seen preoperatively and felt to be stable for surgery.  IV TXA  Anesthesia Issues: None  Comment: Patient instructed to top the Eliquis three days before surgery.  Signed electronically by Lauraine Rinne, III PA-C

## 2015-02-16 ENCOUNTER — Ambulatory Visit: Payer: Self-pay | Admitting: Orthopedic Surgery

## 2015-02-16 NOTE — Progress Notes (Signed)
Preoperative surgical orders have been place into the [facility] hospital system for Isaiah Murphy on 02/16/2015, 9:37 PM  by Patrica DuelPERKINS, Davontae Prusinski for surgery on 03/08/15.  Preop Total Hip - Anterior Approach orders including IV Tylenol, and IV Decadron as long as there are no contraindications to the above medications. Avel Peacerew Kaine Mcquillen, PA-C

## 2015-02-24 NOTE — Progress Notes (Signed)
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

## 2015-03-04 ENCOUNTER — Encounter (HOSPITAL_COMMUNITY): Payer: Self-pay

## 2015-03-04 ENCOUNTER — Encounter (HOSPITAL_COMMUNITY)
Admission: RE | Admit: 2015-03-04 | Discharge: 2015-03-04 | Disposition: A | Discharge status: Home / Self Care | Payer: Federal, State, Local not specified - PPO | Source: Ambulatory Visit | Attending: Orthopedic Surgery | Admitting: Orthopedic Surgery

## 2015-03-04 DIAGNOSIS — Z01812 Encounter for preprocedural laboratory examination: Secondary | ICD-10-CM | POA: Diagnosis present

## 2015-03-04 HISTORY — DX: Gastro-esophageal reflux disease without esophagitis: K21.9

## 2015-03-04 HISTORY — DX: Personal history of other diseases of the circulatory system: Z86.79

## 2015-03-04 HISTORY — DX: Unspecified osteoarthritis, unspecified site: M19.90

## 2015-03-04 HISTORY — DX: Presence of cardiac pacemaker: Z95.0

## 2015-03-04 HISTORY — DX: Cardiac arrhythmia, unspecified: I49.9

## 2015-03-04 HISTORY — DX: Personal history of other specified conditions: Z87.898

## 2015-03-04 HISTORY — DX: Personal history of other diseases of the respiratory system: Z87.09

## 2015-03-04 HISTORY — DX: Sleep apnea, unspecified: G47.30

## 2015-03-04 LAB — URINALYSIS, ROUTINE W REFLEX MICROSCOPIC
BILIRUBIN URINE: NEGATIVE
GLUCOSE, UA: NEGATIVE mg/dL
Ketones, ur: NEGATIVE mg/dL
Leukocytes, UA: NEGATIVE
Nitrite: NEGATIVE
PH: 5 (ref 5.0–8.0)
Protein, ur: NEGATIVE mg/dL
SPECIFIC GRAVITY, URINE: 1.024 (ref 1.005–1.030)

## 2015-03-04 LAB — COMPREHENSIVE METABOLIC PANEL
ALBUMIN: 4.3 g/dL (ref 3.5–5.0)
ALT: 21 U/L (ref 17–63)
ANION GAP: 8 (ref 5–15)
AST: 30 U/L (ref 15–41)
Alkaline Phosphatase: 116 U/L (ref 38–126)
BILIRUBIN TOTAL: 1.2 mg/dL (ref 0.3–1.2)
BUN: 19 mg/dL (ref 6–20)
CO2: 27 mmol/L (ref 22–32)
Calcium: 9.1 mg/dL (ref 8.9–10.3)
Chloride: 97 mmol/L — ABNORMAL LOW (ref 101–111)
Creatinine, Ser: 1.02 mg/dL (ref 0.61–1.24)
GFR calc Af Amer: >60 mL/min (ref >60)
GFR calc non Af Amer: >60 mL/min (ref >60)
GLUCOSE: 99 mg/dL (ref 65–99)
POTASSIUM: 4.3 mmol/L (ref 3.5–5.1)
Sodium: 132 mmol/L — ABNORMAL LOW (ref 135–145)
TOTAL PROTEIN: 8.2 g/dL — AB (ref 6.5–8.1)

## 2015-03-04 LAB — PROTIME-INR
INR: 1.21 (ref 0.00–1.49)
PROTHROMBIN TIME: 15 s (ref 11.6–15.2)

## 2015-03-04 LAB — CBC
HEMATOCRIT: 43.3 % (ref 39.0–52.0)
Hemoglobin: 15.1 g/dL (ref 13.0–17.0)
MCH: 32.5 pg (ref 26.0–34.0)
MCHC: 34.9 g/dL (ref 30.0–36.0)
MCV: 93.3 fL (ref 78.0–100.0)
Platelets: 255 10*3/uL (ref 150–400)
RBC: 4.64 MIL/uL (ref 4.22–5.81)
RDW: 12.5 % (ref 11.5–15.5)
WBC: 9.2 10*3/uL (ref 4.0–10.5)

## 2015-03-04 LAB — APTT: APTT: 37 s (ref 24–37)

## 2015-03-04 LAB — URINE MICROSCOPIC-ADD ON

## 2015-03-04 LAB — ABO/RH: ABO/RH(D): O POS

## 2015-03-04 LAB — SURGICAL PCR SCREEN
MRSA, PCR: NEGATIVE
STAPHYLOCOCCUS AUREUS: NEGATIVE

## 2015-03-04 NOTE — Patient Instructions (Addendum)
Isaiah Murphy  03/04/2015   Your procedure is scheduled on: Monday March 08, 2015  Report to [facility]  Entrance take [place]  elevators to 3rd floor to  Short Stay Center at 1:00 PM.  Call this number if you have problems the morning of surgery [phone]   Remember: ONLY 1 PERSON MAY GO WITH YOU TO SHORT STAY TO GET  READY MORNING OF YOUR SURGERY.  Do not eat food  After Midnight but may take clear liquids till 10:00 am day of surgery then nothing by mouth.      Take these medicines the morning of surgery with A SIP OF WATER: Amlodipine (Norvasc); May use Albuterol Inhaler if needed; Ciclesonide ([place]) Inhaler; May use eye drops; Nexium; May use Nasonex if needed   PLEASE BRING INHALERS, EYE DROPS AND NASAL SPRAY DAY OF SURGERY             STOP ELIQUIS 3 DAYS PRIOR TO SURGERY PER MD INSTRUCTION                                 You may not have any metal on your body including hair pins and              piercings  Do not wear jewelry,  lotions, powders or colognes, deodorant                      Men may shave face and neck.   Do not bring valuables to the hospital. [facility] IS NOT             RESPONSIBLE   FOR VALUABLES.  Contacts, dentures or bridgework may not be worn into surgery.  Leave suitcase in the car. After surgery it may be brought to your room.                Please read over the following fact sheets you were given:INCENTIVE SPIROMETER; BLOOD TRANSFUSION INFORMATION SHEET  _____________________________________________________________________             [facility] - Preparing for Surgery Before surgery, you can play an important role.  Because skin is not sterile, your skin needs to be as free of germs as possible.  You can reduce the number of germs on your skin by washing with CHG (chlorahexidine gluconate) soap before surgery.  CHG is an antiseptic cleaner which kills germs and bonds with the skin to continue killing germs even  after washing. Please DO NOT use if you have an allergy to CHG or antibacterial soaps.  If your skin becomes reddened/irritated stop using the CHG and inform your nurse when you arrive at Short Stay. Do not shave (including legs and underarms) for at least 48 hours prior to the first CHG shower.  You may shave your face/neck. Please follow these instructions carefully:  1.  Shower with CHG Soap the night before surgery and the  morning of Surgery.  2.  If you choose to wash your hair, wash your hair first as usual with your  normal  shampoo.  3.  After you shampoo, rinse your hair and body thoroughly to remove the  shampoo.                           4.  Use CHG as you would any other liquid soap.  You can apply chg directly  to the skin and wash                       Gently with a scrungie or clean washcloth.  5.  Apply the CHG Soap to your body ONLY FROM THE NECK DOWN.   Do not use on face/ open                           Wound or open sores. Avoid contact with eyes, ears mouth and genitals (private parts).                       Wash face,  Genitals (private parts) with your normal soap.             6.  Wash thoroughly, paying special attention to the area where your surgery  will be performed.  7.  Thoroughly rinse your body with warm water from the neck down.  8.  DO NOT shower/wash with your normal soap after using and rinsing off  the CHG Soap.                9.  Pat yourself dry with a clean towel.            10.  Wear clean pajamas.            11.  Place clean sheets on your bed the night of your first shower and do not  sleep with pets. Day of Surgery : Do not apply any lotions/deodorants the morning of surgery.  Please wear clean clothes to the hospital/surgery center.  FAILURE TO FOLLOW THESE INSTRUCTIONS MAY RESULT IN THE CANCELLATION OF YOUR SURGERY PATIENT SIGNATURE_________________________________  NURSE  SIGNATURE__________________________________  ________________________________________________________________________    CLEAR LIQUID DIET   Foods Allowed                                                                     Foods Excluded  Coffee and tea, regular and decaf                             liquids that you cannot  Plain Jell-O in any flavor                                             see through such as: Fruit ices (not with fruit pulp)                                     milk, soups, orange juice  Iced Popsicles                                    All solid food Carbonated beverages, regular and diet  Cranberry, grape and apple juices Sports drinks like Gatorade Lightly seasoned clear broth or consume(fat free) Sugar, honey syrup  Sample Menu Breakfast                                Lunch                                     Supper Cranberry juice                    Beef broth                            Chicken broth Jell-O                                     Grape juice                           Apple juice Coffee or tea                        Jell-O                                      Popsicle                                                Coffee or tea                        Coffee or tea  _____________________________________________________________________    Incentive Spirometer  An incentive spirometer is a tool that can help keep your lungs clear and active. This tool measures how well you are filling your lungs with each breath. Taking long deep breaths may help reverse or decrease the chance of developing breathing (pulmonary) problems (especially infection) following:  A long period of time when you are unable to move or be active. BEFORE THE PROCEDURE   If the spirometer includes an indicator to show your best effort, your nurse or respiratory therapist will set it to a desired goal.  If possible, sit up straight or lean  slightly forward. Try not to slouch.  Hold the incentive spirometer in an upright position. INSTRUCTIONS FOR USE   Sit on the edge of your bed if possible, or sit up as far as you can in bed or on a chair.  Hold the incentive spirometer in an upright position.  Breathe out normally.  Place the mouthpiece in your mouth and seal your lips tightly around it.  Breathe in slowly and as deeply as possible, raising the piston or the ball toward the top of the column.  Hold your breath for 3-5 seconds or for as long as possible. Allow the piston or ball to fall to the bottom of the column.  Remove the mouthpiece from your mouth and breathe out normally.  Rest for a few seconds and repeat Steps 1 through 7 at  least 10 times every 1-2 hours when you are awake. Take your time and take a few normal breaths between deep breaths.  The spirometer may include an indicator to show your best effort. Use the indicator as a goal to work toward during each repetition.  After each set of 10 deep breaths, practice coughing to be sure your lungs are clear. If you have an incision (the cut made at the time of surgery), support your incision when coughing by placing a pillow or rolled up towels firmly against it. Once you are able to get out of bed, walk around indoors and cough well. You may stop using the incentive spirometer when instructed by your caregiver.  RISKS AND COMPLICATIONS  Take your time so you do not get dizzy or light-headed.  If you are in pain, you may need to take or ask for pain medication before doing incentive spirometry. It is harder to take a deep breath if you are having pain. AFTER USE  Rest and breathe slowly and easily.  It can be helpful to keep track of a log of your progress. Your caregiver can provide you with a simple table to help with this. If you are using the spirometer at home, follow these instructions: [place] IF:   You are having difficultly using the  spirometer.  You have trouble using the spirometer as often as instructed.  Your pain medication is not giving enough relief while using the spirometer.  You develop fever of 100.5 F (38.1 C) or higher. SEEK IMMEDIATE MEDICAL CARE IF:   You cough up bloody sputum that had not been present before.  You develop fever of 102 F (38.9 C) or greater.  You develop worsening pain at or near the incision site. MAKE SURE YOU:   Understand these instructions.  Will watch your condition.  Will get help right away if you are not doing well or get worse. Document Released: 06/05/2006 Document Revised: 04/17/2011 Document Reviewed: 08/06/2006 ExitCare Patient Information 2014 ExitCare, [place].   ________________________________________________________________________  WHAT IS A BLOOD TRANSFUSION? Blood Transfusion Information  A transfusion is the replacement of blood or some of its parts. Blood is made up of multiple cells which provide different functions.  Red blood cells carry oxygen and are used for blood loss replacement.  White blood cells fight against infection.  Platelets control bleeding.  Plasma helps clot blood.  Other blood products are available for specialized needs, such as hemophilia or other clotting disorders. BEFORE THE TRANSFUSION  Who gives blood for transfusions?   Healthy volunteers who are fully evaluated to make sure their blood is safe. This is blood bank blood. Transfusion therapy is the safest it has ever been in the practice of medicine. Before blood is taken from a donor, a complete history is taken to make sure that person has no history of diseases nor engages in risky social behavior (examples are intravenous drug use or sexual activity with multiple partners). The donor's travel history is screened to minimize risk of transmitting infections, such as malaria. The donated blood is tested for signs of infectious diseases, such as HIV and hepatitis.  The blood is then tested to be sure it is compatible with you in order to minimize the chance of a transfusion reaction. If you or a relative donates blood, this is often done in anticipation of surgery and is not appropriate for emergency situations. It takes many days to process the donated blood. RISKS AND COMPLICATIONS Although transfusion  therapy is very safe and saves many lives, the main dangers of transfusion include:   Getting an infectious disease.  Developing a transfusion reaction. This is an allergic reaction to something in the blood you were given. Every precaution is taken to prevent this. The decision to have a blood transfusion has been considered carefully by your caregiver before blood is given. Blood is not given unless the benefits outweigh the risks. AFTER THE TRANSFUSION  Right after receiving a blood transfusion, you will usually feel much better and more energetic. This is especially true if your red blood cells have gotten low (anemic). The transfusion raises the level of the red blood cells which carry oxygen, and this usually causes an energy increase.  The nurse administering the transfusion will monitor you carefully for complications. HOME CARE INSTRUCTIONS  No special instructions are needed after a transfusion. You may find your energy is better. Speak with your caregiver about any limitations on activity for underlying diseases you may have. SEEK MEDICAL CARE IF:   Your condition is not improving after your transfusion.  You develop redness or irritation at the intravenous (IV) site. SEEK IMMEDIATE MEDICAL CARE IF:  Any of the following symptoms occur over the next 12 hours:  Shaking chills.  You have a temperature by mouth above 102 F (38.9 C), not controlled by medicine.  Chest, back, or muscle pain.  People around you feel you are not acting correctly or are confused.  Shortness of breath or difficulty breathing.  Dizziness and fainting.  You  get a rash or develop hives.  You have a decrease in urine output.  Your urine turns a dark color or changes to pink, red, or brown. Any of the following symptoms occur over the next 10 days:  You have a temperature by mouth above 102 F (38.9 C), not controlled by medicine.  Shortness of breath.  Weakness after normal activity.  The white part of the eye turns yellow (jaundice).  You have a decrease in the amount of urine or are urinating less often.  Your urine turns a dark color or changes to pink, red, or brown. Document Released: 01/21/2000 Document Revised: 04/17/2011 Document Reviewed: 09/09/2007 [facility] Patient Information 2014 [place], [place].  _______________________________________________________________________

## 2015-03-04 NOTE — Progress Notes (Signed)
Pacemaker orders per chart Clearance note per chart per Dr Kathrin Greathouse 01/28/2015 H&P per chart per [facility] 01/28/2015

## 2015-03-05 ENCOUNTER — Encounter (HOSPITAL_COMMUNITY): Payer: Self-pay

## 2015-03-05 NOTE — Progress Notes (Signed)
EKG per chart 12/122/2016  Stress echocardiography report per chart 05/06/2013  Pacemaker clinic visit info per chart  01/15/2015

## 2015-03-08 ENCOUNTER — Inpatient Hospital Stay (HOSPITAL_COMMUNITY): Payer: Federal, State, Local not specified - PPO | Admitting: Anesthesiology

## 2015-03-08 ENCOUNTER — Inpatient Hospital Stay (HOSPITAL_COMMUNITY): Payer: Federal, State, Local not specified - PPO

## 2015-03-08 ENCOUNTER — Inpatient Hospital Stay (HOSPITAL_COMMUNITY)
Admission: RE | Admit: 2015-03-08 | Discharge: 2015-03-11 | DRG: 470 | Disposition: A | Discharge status: Skilled Nursing Facility | Payer: Federal, State, Local not specified - PPO | Source: Ambulatory Visit | Attending: Orthopedic Surgery | Admitting: Orthopedic Surgery

## 2015-03-08 ENCOUNTER — Encounter (HOSPITAL_COMMUNITY): Payer: Self-pay | Admitting: *Deleted

## 2015-03-08 ENCOUNTER — Encounter (HOSPITAL_COMMUNITY)
Admission: RE | Disposition: A | Discharge status: Skilled Nursing Facility | Payer: Self-pay | Source: Ambulatory Visit | Attending: Orthopedic Surgery

## 2015-03-08 DIAGNOSIS — K219 Gastro-esophageal reflux disease without esophagitis: Secondary | ICD-10-CM | POA: Diagnosis present

## 2015-03-08 DIAGNOSIS — M1611 Unilateral primary osteoarthritis, right hip: Secondary | ICD-10-CM | POA: Diagnosis present

## 2015-03-08 DIAGNOSIS — M1612 Unilateral primary osteoarthritis, left hip: Secondary | ICD-10-CM | POA: Insufficient documentation

## 2015-03-08 DIAGNOSIS — Z96649 Presence of unspecified artificial hip joint: Secondary | ICD-10-CM

## 2015-03-08 DIAGNOSIS — Z01812 Encounter for preprocedural laboratory examination: Secondary | ICD-10-CM | POA: Diagnosis not present

## 2015-03-08 DIAGNOSIS — Z6826 Body mass index (BMI) 26.0-26.9, adult: Secondary | ICD-10-CM | POA: Diagnosis not present

## 2015-03-08 DIAGNOSIS — J45909 Unspecified asthma, uncomplicated: Secondary | ICD-10-CM | POA: Diagnosis present

## 2015-03-08 DIAGNOSIS — I480 Paroxysmal atrial fibrillation: Secondary | ICD-10-CM | POA: Diagnosis present

## 2015-03-08 DIAGNOSIS — I10 Essential (primary) hypertension: Secondary | ICD-10-CM | POA: Diagnosis present

## 2015-03-08 DIAGNOSIS — E7800 Pure hypercholesterolemia, unspecified: Secondary | ICD-10-CM | POA: Diagnosis present

## 2015-03-08 DIAGNOSIS — G4730 Sleep apnea, unspecified: Secondary | ICD-10-CM | POA: Diagnosis present

## 2015-03-08 DIAGNOSIS — I9581 Postprocedural hypotension: Secondary | ICD-10-CM | POA: Diagnosis not present

## 2015-03-08 DIAGNOSIS — Z79899 Other long term (current) drug therapy: Secondary | ICD-10-CM

## 2015-03-08 DIAGNOSIS — Z950 Presence of cardiac pacemaker: Secondary | ICD-10-CM | POA: Diagnosis not present

## 2015-03-08 DIAGNOSIS — M169 Osteoarthritis of hip, unspecified: Secondary | ICD-10-CM | POA: Diagnosis present

## 2015-03-08 DIAGNOSIS — M25551 Pain in right hip: Secondary | ICD-10-CM | POA: Diagnosis present

## 2015-03-08 HISTORY — PX: TOTAL HIP ARTHROPLASTY: SHX124

## 2015-03-08 LAB — GLUCOSE, CAPILLARY: Glucose-Capillary: 161 mg/dL — ABNORMAL HIGH (ref 65–99)

## 2015-03-08 LAB — TYPE AND SCREEN
ABO/RH(D): O POS
ANTIBODY SCREEN: NEGATIVE

## 2015-03-08 LAB — CBC
HEMATOCRIT: 36.9 % — AB (ref 39.0–52.0)
Hemoglobin: 12.6 g/dL — ABNORMAL LOW (ref 13.0–17.0)
MCH: 32.3 pg (ref 26.0–34.0)
MCHC: 34.1 g/dL (ref 30.0–36.0)
MCV: 94.6 fL (ref 78.0–100.0)
Platelets: 199 10*3/uL (ref 150–400)
RBC: 3.9 MIL/uL — AB (ref 4.22–5.81)
RDW: 12.3 % (ref 11.5–15.5)
WBC: 18.2 10*3/uL — AB (ref 4.0–10.5)

## 2015-03-08 SURGERY — ARTHROPLASTY, HIP, TOTAL, ANTERIOR APPROACH
Anesthesia: Spinal | Site: Hip | Laterality: Right

## 2015-03-08 MED ORDER — BUPIVACAINE HCL (PF) 0.25 % IJ SOLN
INTRAMUSCULAR | Status: AC
  Filled 2015-03-08: qty 30

## 2015-03-08 MED ORDER — SODIUM CHLORIDE 0.9 % IV SOLN: INTRAVENOUS | Status: DC

## 2015-03-08 MED ORDER — CEFAZOLIN SODIUM-DEXTROSE 2-3 GM-% IV SOLR
INTRAVENOUS | Status: AC
  Filled 2015-03-08: qty 50

## 2015-03-08 MED ORDER — DOCUSATE SODIUM 100 MG PO CAPS
100.0000 mg | ORAL_CAPSULE | Freq: Two times a day (BID) | ORAL | Status: DC
  Administered 2015-03-08 – 2015-03-11 (×6): 100 mg via ORAL

## 2015-03-08 MED ORDER — METOCLOPRAMIDE HCL 5 MG/ML IJ SOLN: 5.0000 mg | Freq: Three times a day (TID) | INTRAMUSCULAR | Status: DC | PRN

## 2015-03-08 MED ORDER — ALBUTEROL SULFATE (2.5 MG/3ML) 0.083% IN NEBU: 3.0000 mL | INHALATION_SOLUTION | RESPIRATORY_TRACT | Status: DC | PRN

## 2015-03-08 MED ORDER — BUPIVACAINE LIPOSOME 1.3 % IJ SUSP
20.0000 mL | Freq: Once | INTRAMUSCULAR | Status: DC
  Filled 2015-03-08: qty 20

## 2015-03-08 MED ORDER — PHENYLEPHRINE HCL 10 MG/ML IJ SOLN: 30.0000 ug/min | INTRAMUSCULAR | Status: DC

## 2015-03-08 MED ORDER — CEFAZOLIN SODIUM-DEXTROSE 2-3 GM-% IV SOLR
2.0000 g | Freq: Four times a day (QID) | INTRAVENOUS | Status: AC
  Administered 2015-03-08 – 2015-03-09 (×2): 2 g via INTRAVENOUS
  Filled 2015-03-08 (×2): qty 50

## 2015-03-08 MED ORDER — METOCLOPRAMIDE HCL 10 MG PO TABS: 5.0000 mg | ORAL_TABLET | Freq: Three times a day (TID) | ORAL | Status: DC | PRN

## 2015-03-08 MED ORDER — CEFAZOLIN SODIUM-DEXTROSE 2-3 GM-% IV SOLR
2.0000 g | INTRAVENOUS | Status: AC
  Administered 2015-03-08: 2 g via INTRAVENOUS

## 2015-03-08 MED ORDER — PROPOFOL 10 MG/ML IV BOLUS
INTRAVENOUS | Status: AC
  Filled 2015-03-08: qty 40

## 2015-03-08 MED ORDER — DEXAMETHASONE SODIUM PHOSPHATE 10 MG/ML IJ SOLN
10.0000 mg | Freq: Once | INTRAMUSCULAR | Status: AC
  Administered 2015-03-09: 10 mg via INTRAVENOUS
  Filled 2015-03-08: qty 1

## 2015-03-08 MED ORDER — PHENOL 1.4 % MT LIQD
1.0000 | OROMUCOSAL | Status: DC | PRN
Start: 2015-03-08 — End: 2015-03-11

## 2015-03-08 MED ORDER — BUDESONIDE 0.5 MG/2ML IN SUSP: 1.0000 mg | Freq: Two times a day (BID) | RESPIRATORY_TRACT | Status: DC | PRN

## 2015-03-08 MED ORDER — LACTATED RINGERS IV SOLN
INTRAVENOUS | Status: DC
  Administered 2015-03-08: 1000 mL via INTRAVENOUS

## 2015-03-08 MED ORDER — BUPIVACAINE HCL (PF) 0.5 % IJ SOLN
INTRAMUSCULAR | Status: DC | PRN
  Administered 2015-03-08: 2.5 mL via INTRATHECAL

## 2015-03-08 MED ORDER — DEXAMETHASONE SODIUM PHOSPHATE 10 MG/ML IJ SOLN
INTRAMUSCULAR | Status: AC
  Filled 2015-03-08: qty 1

## 2015-03-08 MED ORDER — MIDAZOLAM HCL 5 MG/5ML IJ SOLN
INTRAMUSCULAR | Status: DC | PRN
  Administered 2015-03-08 (×2): 1 mg via INTRAVENOUS

## 2015-03-08 MED ORDER — DIPHENHYDRAMINE HCL 12.5 MG/5ML PO ELIX: 12.5000 mg | ORAL_SOLUTION | ORAL | Status: DC | PRN

## 2015-03-08 MED ORDER — ONDANSETRON HCL 4 MG/2ML IJ SOLN
INTRAMUSCULAR | Status: AC
  Filled 2015-03-08: qty 2

## 2015-03-08 MED ORDER — MONTELUKAST SODIUM 10 MG PO TABS
10.0000 mg | ORAL_TABLET | Freq: Every day | ORAL | Status: DC
  Administered 2015-03-09 – 2015-03-10 (×2): 10 mg via ORAL
  Filled 2015-03-08 (×3): qty 1

## 2015-03-08 MED ORDER — MORPHINE SULFATE (PF) 2 MG/ML IV SOLN: 1.0000 mg | INTRAVENOUS | Status: DC | PRN

## 2015-03-08 MED ORDER — PANTOPRAZOLE SODIUM 40 MG PO TBEC
80.0000 mg | DELAYED_RELEASE_TABLET | Freq: Every day | ORAL | Status: DC
Start: 2015-03-09 — End: 2015-03-09
  Filled 2015-03-08: qty 2

## 2015-03-08 MED ORDER — FENTANYL CITRATE (PF) 100 MCG/2ML IJ SOLN: 25.0000 ug | INTRAMUSCULAR | Status: DC | PRN

## 2015-03-08 MED ORDER — ONDANSETRON HCL 4 MG/2ML IJ SOLN: 4.0000 mg | Freq: Four times a day (QID) | INTRAMUSCULAR | Status: DC | PRN

## 2015-03-08 MED ORDER — FLEET ENEMA 7-19 GM/118ML RE ENEM: 1.0000 | ENEMA | Freq: Once | RECTAL | Status: DC | PRN

## 2015-03-08 MED ORDER — ACETAMINOPHEN 500 MG PO TABS
1000.0000 mg | ORAL_TABLET | Freq: Four times a day (QID) | ORAL | Status: AC
  Administered 2015-03-08 – 2015-03-09 (×4): 1000 mg via ORAL
  Filled 2015-03-08 (×4): qty 2

## 2015-03-08 MED ORDER — ACETAMINOPHEN 325 MG PO TABS
650.0000 mg | ORAL_TABLET | Freq: Four times a day (QID) | ORAL | Status: DC | PRN
  Administered 2015-03-11: 650 mg via ORAL
  Filled 2015-03-08: qty 2

## 2015-03-08 MED ORDER — SODIUM CHLORIDE 0.9 % IV SOLN
1000.0000 mg | INTRAVENOUS | Status: AC
  Administered 2015-03-08: 1000 mg via INTRAVENOUS
  Filled 2015-03-08: qty 10

## 2015-03-08 MED ORDER — OLOPATADINE HCL 0.1 % OP SOLN
1.0000 [drp] | Freq: Two times a day (BID) | OPHTHALMIC | Status: DC
  Administered 2015-03-09 – 2015-03-11 (×5): 1 [drp] via OPHTHALMIC
  Filled 2015-03-08: qty 5

## 2015-03-08 MED ORDER — ACETAMINOPHEN 650 MG RE SUPP: 650.0000 mg | Freq: Four times a day (QID) | RECTAL | Status: DC | PRN

## 2015-03-08 MED ORDER — LIDOCAINE HCL (CARDIAC) 20 MG/ML IV SOLN
INTRAVENOUS | Status: AC
  Filled 2015-03-08: qty 5

## 2015-03-08 MED ORDER — HYDROCHLOROTHIAZIDE 25 MG PO TABS
25.0000 mg | ORAL_TABLET | Freq: Every day | ORAL | Status: DC
  Administered 2015-03-10: 25 mg via ORAL
  Filled 2015-03-08 (×4): qty 1

## 2015-03-08 MED ORDER — SODIUM CHLORIDE 0.9 % IV SOLN
INTRAVENOUS | Status: DC
  Administered 2015-03-08 – 2015-03-09 (×3): via INTRAVENOUS

## 2015-03-08 MED ORDER — AMLODIPINE BESYLATE 10 MG PO TABS
10.0000 mg | ORAL_TABLET | Freq: Every morning | ORAL | Status: DC
  Administered 2015-03-11: 10 mg via ORAL
  Filled 2015-03-08 (×3): qty 1

## 2015-03-08 MED ORDER — PROPOFOL 500 MG/50ML IV EMUL
INTRAVENOUS | Status: DC | PRN
  Administered 2015-03-08: 25 ug/kg/min via INTRAVENOUS

## 2015-03-08 MED ORDER — BISACODYL 10 MG RE SUPP: 10.0000 mg | Freq: Every day | RECTAL | Status: DC | PRN

## 2015-03-08 MED ORDER — ATORVASTATIN CALCIUM 20 MG PO TABS
20.0000 mg | ORAL_TABLET | Freq: Every day | ORAL | Status: DC
  Administered 2015-03-09 – 2015-03-10 (×2): 20 mg via ORAL
  Filled 2015-03-08 (×3): qty 1

## 2015-03-08 MED ORDER — LIDOCAINE HCL (CARDIAC) 20 MG/ML IV SOLN
INTRAVENOUS | Status: DC | PRN
  Administered 2015-03-08: 100 mg via INTRAVENOUS

## 2015-03-08 MED ORDER — PHENYLEPHRINE HCL 10 MG/ML IJ SOLN
10.0000 mg | INTRAVENOUS | Status: DC | PRN
  Administered 2015-03-08: 40 ug/min via INTRAVENOUS

## 2015-03-08 MED ORDER — ONDANSETRON HCL 4 MG/2ML IJ SOLN: 4.0000 mg | Freq: Once | INTRAMUSCULAR | Status: DC | PRN

## 2015-03-08 MED ORDER — POLYETHYLENE GLYCOL 3350 17 G PO PACK: 17.0000 g | PACK | Freq: Every day | ORAL | Status: DC | PRN

## 2015-03-08 MED ORDER — DEXAMETHASONE SODIUM PHOSPHATE 10 MG/ML IJ SOLN: 10.0000 mg | Freq: Once | INTRAMUSCULAR | Status: DC

## 2015-03-08 MED ORDER — MENTHOL 3 MG MT LOZG: 1.0000 | LOZENGE | OROMUCOSAL | Status: DC | PRN

## 2015-03-08 MED ORDER — METHOCARBAMOL 500 MG PO TABS
500.0000 mg | ORAL_TABLET | Freq: Four times a day (QID) | ORAL | Status: DC | PRN
  Administered 2015-03-09: 500 mg via ORAL
  Filled 2015-03-08: qty 1

## 2015-03-08 MED ORDER — FLUTICASONE PROPIONATE 50 MCG/ACT NA SUSP
2.0000 | Freq: Every day | NASAL | Status: DC
  Administered 2015-03-10 – 2015-03-11 (×2): 2 via NASAL
  Filled 2015-03-08: qty 16

## 2015-03-08 MED ORDER — BUPIVACAINE HCL (PF) 0.25 % IJ SOLN
INTRAMUSCULAR | Status: DC | PRN
  Administered 2015-03-08: 30 mL

## 2015-03-08 MED ORDER — ONDANSETRON HCL 4 MG PO TABS: 4.0000 mg | ORAL_TABLET | Freq: Four times a day (QID) | ORAL | Status: DC | PRN

## 2015-03-08 MED ORDER — CHLORHEXIDINE GLUCONATE 4 % EX LIQD: 60.0000 mL | Freq: Once | CUTANEOUS | Status: DC

## 2015-03-08 MED ORDER — ONDANSETRON HCL 4 MG/2ML IJ SOLN
INTRAMUSCULAR | Status: DC | PRN
  Administered 2015-03-08: 4 mg via INTRAVENOUS

## 2015-03-08 MED ORDER — TRAMADOL HCL 50 MG PO TABS
50.0000 mg | ORAL_TABLET | Freq: Four times a day (QID) | ORAL | Status: DC | PRN
  Administered 2015-03-09: 50 mg via ORAL
  Filled 2015-03-08: qty 1

## 2015-03-08 MED ORDER — MIDAZOLAM HCL 2 MG/2ML IJ SOLN
INTRAMUSCULAR | Status: AC
  Filled 2015-03-08: qty 2

## 2015-03-08 MED ORDER — FENTANYL CITRATE (PF) 100 MCG/2ML IJ SOLN
INTRAMUSCULAR | Status: AC
  Filled 2015-03-08: qty 2

## 2015-03-08 MED ORDER — PHENYLEPHRINE HCL 10 MG/ML IJ SOLN
INTRAMUSCULAR | Status: DC | PRN
  Administered 2015-03-08: 40 ug via INTRAVENOUS
  Administered 2015-03-08: 80 ug via INTRAVENOUS
  Administered 2015-03-08 (×2): 40 ug via INTRAVENOUS

## 2015-03-08 MED ORDER — ACETAMINOPHEN 10 MG/ML IV SOLN
INTRAVENOUS | Status: AC
  Filled 2015-03-08: qty 100

## 2015-03-08 MED ORDER — OXYCODONE HCL 5 MG PO TABS
5.0000 mg | ORAL_TABLET | ORAL | Status: DC | PRN
  Administered 2015-03-08 – 2015-03-09 (×2): 5 mg via ORAL
  Administered 2015-03-09 – 2015-03-11 (×6): 10 mg via ORAL
  Filled 2015-03-08: qty 2
  Filled 2015-03-08: qty 1
  Filled 2015-03-08 (×4): qty 2
  Filled 2015-03-08: qty 1
  Filled 2015-03-08: qty 2

## 2015-03-08 MED ORDER — ACETAMINOPHEN 10 MG/ML IV SOLN
1000.0000 mg | Freq: Once | INTRAVENOUS | Status: AC
  Administered 2015-03-08: 1000 mg via INTRAVENOUS
  Filled 2015-03-08: qty 100

## 2015-03-08 MED ORDER — METHOCARBAMOL 1000 MG/10ML IJ SOLN
500.0000 mg | Freq: Four times a day (QID) | INTRAVENOUS | Status: DC | PRN
  Administered 2015-03-08: 500 mg via INTRAVENOUS
  Filled 2015-03-08 (×2): qty 5

## 2015-03-08 MED ORDER — FENTANYL CITRATE (PF) 100 MCG/2ML IJ SOLN
INTRAMUSCULAR | Status: DC | PRN
  Administered 2015-03-08: 25 ug via INTRAVENOUS
  Administered 2015-03-08: 50 ug via INTRAVENOUS

## 2015-03-08 MED ORDER — APIXABAN 2.5 MG PO TABS
2.5000 mg | ORAL_TABLET | Freq: Two times a day (BID) | ORAL | Status: DC
  Administered 2015-03-09 – 2015-03-11 (×5): 2.5 mg via ORAL
  Filled 2015-03-08 (×7): qty 1

## 2015-03-08 SURGICAL SUPPLY — 35 items
BAG DECANTER FOR FLEXI CONT (MISCELLANEOUS) ×3 IMPLANT
BAG SPEC THK2 15X12 ZIP CLS (MISCELLANEOUS)
BAG ZIPLOCK 12X15 (MISCELLANEOUS) IMPLANT
BLADE SAG 18X100X1.27 (BLADE) ×3 IMPLANT
CAPT HIP TOTAL 2 ×2 IMPLANT
CLOSURE WOUND 1/2 X4 (GAUZE/BANDAGES/DRESSINGS) ×1
CLOTH BEACON ORANGE TIMEOUT ST (SAFETY) ×3 IMPLANT
COVER PERINEAL POST (MISCELLANEOUS) ×3 IMPLANT
DECANTER SPIKE VIAL GLASS SM (MISCELLANEOUS) ×3 IMPLANT
DRAPE STERI IOBAN 125X83 (DRAPES) ×3 IMPLANT
DRAPE U-SHAPE 47X51 STRL (DRAPES) ×6 IMPLANT
DRSG ADAPTIC 3X8 NADH LF (GAUZE/BANDAGES/DRESSINGS) ×3 IMPLANT
DRSG MEPILEX BORDER 4X4 (GAUZE/BANDAGES/DRESSINGS) ×3 IMPLANT
DRSG MEPILEX BORDER 4X8 (GAUZE/BANDAGES/DRESSINGS) ×3 IMPLANT
DURAPREP 26ML APPLICATOR (WOUND CARE) ×3 IMPLANT
ELECT REM PT RETURN 9FT ADLT (ELECTROSURGICAL) ×3
ELECTRODE REM PT RTRN 9FT ADLT (ELECTROSURGICAL) ×1 IMPLANT
EVACUATOR 1/8 PVC DRAIN (DRAIN) ×3 IMPLANT
GLOVE BIO SURGEON STRL SZ7.5 (GLOVE) ×3 IMPLANT
GLOVE BIO SURGEON STRL SZ8 (GLOVE) ×6 IMPLANT
GLOVE BIOGEL PI IND STRL 8 (GLOVE) ×2 IMPLANT
GLOVE BIOGEL PI INDICATOR 8 (GLOVE) ×4
GOWN STRL REUS W/TWL LRG LVL3 (GOWN DISPOSABLE) ×3 IMPLANT
GOWN STRL REUS W/TWL XL LVL3 (GOWN DISPOSABLE) ×3 IMPLANT
PACK ANTERIOR HIP CUSTOM (KITS) ×3 IMPLANT
STRIP CLOSURE SKIN 1/2X4 (GAUZE/BANDAGES/DRESSINGS) ×2 IMPLANT
SUT ETHIBOND NAB CT1 #1 30IN (SUTURE) ×3 IMPLANT
SUT MNCRL AB 4-0 PS2 18 (SUTURE) ×3 IMPLANT
SUT VIC AB 2-0 CT1 27 (SUTURE) ×6
SUT VIC AB 2-0 CT1 TAPERPNT 27 (SUTURE) ×2 IMPLANT
SUT VLOC 180 0 24IN GS25 (SUTURE) ×3 IMPLANT
SYR 50ML LL SCALE MARK (SYRINGE) IMPLANT
TRAY FOLEY W/METER SILVER 14FR (SET/KITS/TRAYS/PACK) ×1 IMPLANT
TRAY FOLEY W/METER SILVER 16FR (SET/KITS/TRAYS/PACK) ×3 IMPLANT
YANKAUER SUCT BULB TIP 10FT TU (MISCELLANEOUS) ×3 IMPLANT

## 2015-03-08 NOTE — Anesthesia Postprocedure Evaluation (Signed)
Anesthesia Post Note  Patient: Isaiah Murphy  Procedure(s) Performed: Procedure(s) (LRB): RIGHT TOTAL HIP ARTHROPLASTY ANTERIOR APPROACH (Right)  Patient location during evaluation: PACU Anesthesia Type: Spinal Level of consciousness: oriented and awake and alert Pain management: pain level controlled Vital Signs Assessment: post-procedure vital signs reviewed and stable Respiratory status: spontaneous breathing, respiratory function stable and patient connected to nasal cannula oxygen Cardiovascular status: blood pressure returned to baseline and stable Postop Assessment: no headache, no backache and spinal receding Anesthetic complications: no    Last Vitals:  Filed Vitals:   03/08/15 1900 03/08/15 1905  BP: 112/78 109/81  Pulse: 70 70  Temp:    Resp: 14 14    Last Pain:  Filed Vitals:   03/08/15 1909  PainSc: 0-No pain    LLE Motor Response: Purposeful movement ("rocks" legs back and forth ) (03/08/15 1900) LLE Sensation: No sensation (absent) (03/08/15 1900) RLE Motor Response: Purposeful movement ("rocks" legs together) (03/08/15 1900) RLE Sensation: No sensation (absent) (03/08/15 1900) L Sensory Level: L4-Anterior knee, lower leg (03/08/15 1900) R Sensory Level: L4-Anterior knee, lower leg (03/08/15 1900)  Reino Kent

## 2015-03-08 NOTE — ED Provider Notes (Signed)
[facility]  Department of Emergency Medicine   Code Blue CONSULT NOTE  Chief Complaint: Cardiac arrest/unresponsive   Level V Caveat: Unresponsive  History of present illness: I was contacted by the hospital for a CODE BLUE cardiac arrest upstairs and presented to the patient's bedside. Bedside nurse reports that the patient was on a bedpan trying to have bowel movements when he became unresponsive. She checked his blood pressure which was in the 50s systolic. She called a CODE BLUE. On my arrival, the patient had become more responsive and was breathing spontaneously. He complained of some right hip pain from his hip surgery but denies any other complaints.  ROS: Unable to obtain, Level V caveat  Scheduled Meds: Continuous Infusions: PRN Meds:. Past Medical History  Diagnosis Date  . Hypertension   . Dyslipidemia   . Asthma   . Allergic rhinitis   . Presence of permanent cardiac pacemaker   . Sleep apnea     pt states not currently using CPAP machine   . History of bronchitis   . GERD (gastroesophageal reflux disease)   . Arthritis   . Dysrhythmia     history of paroxysmal A Fib and typical A Flutter   . History of sick sinus syndrome   . History of syncope    Past Surgical History  Procedure Laterality Date  . Hemorrhoid surgery    . Tee without cardioversion N/A 04/28/2013    Procedure: TRANSESOPHAGEAL ECHOCARDIOGRAM (TEE);  Surgeon: Lars Masson, MD;  Location: [facility] ENDOSCOPY;  Service: Cardiovascular;  Laterality: N/A;  . Cardioversion N/A 04/28/2013    Procedure: CARDIOVERSION;  Surgeon: Lars Masson, MD;  Location: [facility] ENDOSCOPY;  Service: Cardiovascular;  Laterality: N/A;  . Left shoulder surgery       secondary to torn ligament / subluxation   Social History   Social History  . Marital Status: Married    Spouse Name: N/A  . Number of Children: N/A  . Years of Education: N/A   Occupational History  . Not on file.   Social History  Main Topics  . Smoking status: Never Smoker   . Smokeless tobacco: Never Used  . Alcohol Use: 0.0 oz/week    0 Standard drinks or equivalent per week     Comment: socially   . Drug Use: No  . Sexual Activity: Not on file   Other Topics Concern  . Not on file   Social History Narrative   Married, Scientist, research (medical) in [place]   Allergies  Allergen Reactions  . Penicillins     Has patient had a PCN reaction causing immediate rash, facial/tongue/throat swelling, SOB or lightheadedness with hypotension: No Has patient had a PCN reaction causing severe rash involving mucus membranes or skin necrosis: No Has patient had a PCN reaction that required hospitalization No Has patient had a PCN reaction occurring within the last 10 years: No If all of the above answers are "NO", then may proceed with Cephalosporin use. Rash at the injection site    Last set of Vital Signs (not current)  Filed Vitals:   04/29/13 0614 04/29/13 0800 04/29/13 0816 04/29/13 1043  BP: 95/69 105/70 105/70 111/76  Pulse:      Temp:   97.8 F (36.6 C)   TempSrc:   Oral   Resp:  Height:      Weight:      SpO2: 97% 98% 98% 98%     Physical Exam  Gen: eyes open, non-verbal Cardiovascular: RRR, no M/R/G Resp: CTA b/l, normal WOB Abd: nondistended  Neuro: awake, moving all 4 extremities, answering questions, GCS 14 2/2 confusion Skin: cool, no rash    CRITICAL CARE Performed by: Ambrose Finland Little Total critical care time: 30 minutes Critical care time was exclusive of separately billable procedures and treating other patients. Critical care was necessary to treat or prevent imminent or life-threatening deterioration. Critical care was time spent personally by me on the following activities: development of treatment plan with patient and/or surrogate as well as nursing, discussions with consultants, evaluation of patient's response to treatment, examination of patient,  obtaining history from patient or surrogate, ordering and performing treatments and interventions, ordering and review of laboratory studies, ordering and review of radiographic studies, pulse oximetry and re-evaluation of patient's condition.   Medical Decision making  Patient in hospital after hip surgery with episode of unresponsiveness while on bedpan attempting to have bowel movements. CODE BLUE was called for unresponsiveness and hypotension. On my arrival, the patient was awake, mildly confused but moving all 4 extremities. Breathing spontaneously with clear breath sounds bilaterally. Immediately placed patient on cardiac monitoring and obtained blood glucose which was normal. Repeat BP remained low in the 90s systolic. Placed patient in Trendelenburg and initiated IV fluid bolus. Obtained EKG which showed sinus rhythm, no acute ischemic changes. Requested repeat CBC given recent surgery. After 10-15 minutes, the patient remained with stable mental status and his blood pressure slowly improved. I discussed events with the PA on call. Given that his pressure is improving, I do not feel he requires any acute interventions at this time except fluid bolus. I suspect vagal episode given circumstances preceding event. Patient signed back to primary floor team in stable condition.  Assessment and Plan  1. Unresponsiveness, resolved 2. Hypotension  Plan: see MDM   Laurence Spates, MD 03/08/15 [phone]

## 2015-03-08 NOTE — Interval H&P Note (Signed)
History and Physical Interval Note:  03/08/2015 3:59 PM  Isaiah Murphy  has presented today for surgery, with the diagnosis of right hip osteoarthritis  The various methods of treatment have been discussed with the patient and family. After consideration of risks, benefits and other options for treatment, the patient has consented to  Procedure(s): RIGHT TOTAL HIP ARTHROPLASTY ANTERIOR APPROACH (Right) as a surgical intervention .  The patient's history has been reviewed, patient examined, no change in status, stable for surgery.  I have reviewed the patient's chart and labs.  Questions were answered to the patient's satisfaction.     Loanne Drilling

## 2015-03-08 NOTE — Interval H&P Note (Signed)
History and Physical Interval Note:  03/08/2015 4:30 PM  Isaiah Murphy  has presented today for surgery, with the diagnosis of right hip osteoarthritis  The various methods of treatment have been discussed with the patient and family. After consideration of risks, benefits and other options for treatment, the patient has consented to  Procedure(s): RIGHT TOTAL HIP ARTHROPLASTY ANTERIOR APPROACH (Right) as a surgical intervention .  The patient's history has been reviewed, patient examined, no change in status, stable for surgery.  I have reviewed the patient's chart and labs.  Questions were answered to the patient's satisfaction.     Loanne Drilling

## 2015-03-08 NOTE — Anesthesia Procedure Notes (Addendum)
Procedure Name: MAC Date/Time: 03/08/2015 4:41 PM Performed by: Jarvis Newcomer A Pre-anesthesia Checklist: Patient identified, Timeout performed, Emergency Drugs available, Suction available and Patient being monitored Patient Re-evaluated:Patient Re-evaluated prior to inductionOxygen Delivery Method: Simple face mask Dental Injury: Teeth and Oropharynx as per pre-operative assessment     Spinal Patient location during procedure: OR Start time: 03/08/2015 4:48 PM End time: 03/08/2015 4:52 PM Staffing Anesthesiologist: Diesel Lina Performed by: anesthesiologist  Preanesthetic Checklist Completed: patient identified, site marked, surgical consent, pre-op evaluation, timeout performed, IV checked, risks and benefits discussed and monitors and equipment checked Spinal Block Patient position: sitting Prep: ChloraPrep Patient monitoring: heart rate, continuous pulse ox and blood pressure Location: L3-4 Injection technique: single-shot Needle Needle type: Quincke  Needle gauge: 22 G Needle length: 9 cm Additional Notes Functioning IV was confirmed and monitors were applied. Sterile prep and drape, including hand hygiene, mask and sterile gloves were used. The patient was positioned and the spine was prepped. The skin was anesthetized with lidocaine.  Free flow of clear CSF was obtained prior to injecting local anesthetic into the CSF.  The spinal needle aspirated freely following injection.  The needle was carefully withdrawn.  The patient tolerated the procedure well. Consent was obtained prior to procedure with all questions answered and concerns addressed.  Blima Dessert, MD

## 2015-03-08 NOTE — Op Note (Signed)
OPERATIVE REPORT  PREOPERATIVE DIAGNOSIS: Osteoarthritis of the Right hip.   POSTOPERATIVE DIAGNOSIS: Osteoarthritis of the Right  hip.   PROCEDURE: Right total hip arthroplasty, anterior approach.   SURGEON: Ollen Gross, MD   ASSISTANT: Avel Peace, PA-C  ANESTHESIA:  Spinal  ESTIMATED BLOOD LOSS:-400 ml   DRAINS: Hemovac x1.   COMPLICATIONS: None   CONDITION: PACU - hemodynamically stable.   BRIEF CLINICAL NOTE: Isaiah Murphy is a 81 y.o. male who has advanced end-  stage arthritis of his Right  hip with progressively worsening pain and  dysfunction.The patient has failed nonoperative management and presents for  total hip arthroplasty.   PROCEDURE IN DETAIL: After successful administration of spinal  anesthetic, the traction boots for the [facility] bed were placed on both  feet and the patient was placed onto the [facility] bed, boots placed into the leg  holders. The Right hip was then isolated from the perineum with plastic  drapes and prepped and draped in the usual sterile fashion. ASIS and  greater trochanter were marked and a oblique incision was made, starting  at about 1 cm lateral and 2 cm distal to the ASIS and coursing towards  the anterior cortex of the femur. The skin was cut with a 10 blade  through subcutaneous tissue to the level of the fascia overlying the  tensor fascia lata muscle. The fascia was then incised in line with the  incision at the junction of the anterior third and posterior 2/3rd. The  muscle was teased off the fascia and then the interval between the TFL  and the rectus was developed. The Hohmann retractor was then placed at  the top of the femoral neck over the capsule. The vessels overlying the  capsule were cauterized and the fat on top of the capsule was removed.  A Hohmann retractor was then placed anterior underneath the rectus  femoris to give exposure to the entire anterior capsule. A T-shaped  capsulotomy was performed. The  edges were tagged and the femoral head  was identified.       Osteophytes are removed off the superior acetabulum.  The femoral neck was then cut in situ with an oscillating saw. Traction  was then applied to the left lower extremity utilizing the [facility]  traction. The femoral head was then removed. Retractors were placed  around the acetabulum and then circumferential removal of the labrum was  performed. Osteophytes were also removed. Reaming starts at 47 mm to  medialize and  Increased in 2 mm increments to 51 mm. We reamed in  approximately 40 degrees of abduction, 20 degrees anteversion. A 52 mm  pinnacle acetabular shell was then impacted in anatomic position under  fluoroscopic guidance with excellent purchase. We did not need to place  any additional dome screws. A 32 mm neutral + 4 marathon liner was then  placed into the acetabular shell.       The femoral lift was then placed along the lateral aspect of the femur  just distal to the vastus ridge. The leg was  externally rotated and capsule  was stripped off the inferior aspect of the femoral neck down to the  level of the lesser trochanter, this was done with electrocautery. The femur was lifted after this was performed. The  leg was then placed and extended in adducted position to essentially delivering the femur. We also removed the capsule superiorly and the  piriformis from the piriformis  fossa to gain excellent exposure of the  proximal femur. Rongeur was used to remove some cancellous bone to get  into the lateral portion of the proximal femur for placement of the  initial starter reamer. The starter broaches was placed  the starter broach  and was shown to go down the center of the canal. Broaching  with the  Corail system was then performed starting at size 8, coursing  Up to size 13. A size 13 had excellent torsional and rotational  and axial stability. The trial high offset neck was then placed  with a 32 + 5 trial head.  The hip was then reduced. We confirmed that  the stem was in the canal both on AP and lateral x-rays. It also has excellent sizing. The hip was reduced with outstanding stability through full extension, full external rotation,  and then flexion in adduction internal rotation. AP pelvis was taken  and the leg lengths were measured and found to be exactly equal. Hip  was then dislocated again and the femoral head and neck removed. The  femoral broach was removed. Size 13 Corail stem with a high offset  neck was then impacted into the femur following native anteversion. Has  excellent purchase in the canal. Excellent torsional and rotational and  axial stability. It is confirmed to be in the canal on AP and lateral  fluoroscopic views. The 32 + 5 ceramic head was placed and the hip  reduced with outstanding stability. Again AP pelvis was taken and it  confirmed that the leg lengths were equal. The wound was then copiously  irrigated with saline solution and the capsule reattached and repaired  with Ethibond suture. 30 ml of .25% Bupivicaine injected into the capsule and into the edge of the tensor fascia lata as well as subcutaneous tissue. The fascia overlying the tensor fascia lata was  then closed with a running #1 V-Loc. Subcu was closed with interrupted  2-0 Vicryl and subcuticular running 4-0 Monocryl. Incision was cleaned  and dried. Steri-Strips and a bulky sterile dressing applied. Hemovac  drain was hooked to suction and then he was awakened and transported to  recovery in stable condition.        Please note that a surgical assistant was a medical necessity for this procedure to perform it in a safe and expeditious manner. Assistant was necessary to provide appropriate retraction of vital neurovascular structures and to prevent femoral fracture and allow for anatomic placement of the prosthesis.  Ollen Gross, M.D.

## 2015-03-08 NOTE — Transfer of Care (Signed)
Immediate Anesthesia Transfer of Care Note  Patient: Isaiah Murphy  Procedure(s) Performed: Procedure(s): RIGHT TOTAL HIP ARTHROPLASTY ANTERIOR APPROACH (Right)  Patient Location: PACU  Anesthesia Type:General  Level of Consciousness:  sedated, patient cooperative and responds to stimulation  Airway & Oxygen Therapy:Patient Spontanous Breathing and Patient connected to face mask oxgen  Post-op Assessment:  Report given to PACU RN and Post -op Vital signs reviewed and stable  Post vital signs:  Reviewed and stable  Last Vitals: There were no vitals filed for this visit.  Complications: No apparent anesthesia complications

## 2015-03-08 NOTE — Progress Notes (Signed)
At 22:00, Low blood pressure reported 55/31. Found patient unresponsive with strong pulse. Called code team.  Found patient sitting on bedpan. Patient responsive with eyes open and spoke. Code team arrived and medical steps taken. IV fluids bolus, EKG, head lower, CBC, and CBG done. Dr. Everett Graff spoke to A. Cabery, [place] regarding the patient's condition. Will continue to monitor patient. Patient fully alert at this time and eating.  BP 106/60 and no other symptoms at this time.

## 2015-03-08 NOTE — Anesthesia Preprocedure Evaluation (Addendum)
Anesthesia Evaluation  Patient identified by MRN, date of birth, ID band Patient awake    Reviewed: Allergy & Precautions, H&P , NPO status , Patient's Chart, lab work & pertinent test results  Airway Mallampati: II  TM Distance: >3 FB Neck ROM: Full    Dental no notable dental hx.    Pulmonary asthma , sleep apnea ,    breath sounds clear to auscultation       Cardiovascular hypertension, Pt. on medications + dysrhythmias Atrial Fibrillation + pacemaker  Rhythm:Regular Rate:Normal  Paced with pacemaker for tachy-brady syndrome   Neuro/Psych    GI/Hepatic GERD  ,  Endo/Other    Renal/GU      Musculoskeletal  (+) Arthritis ,   Abdominal   Peds  Hematology   Anesthesia Other Findings   Reproductive/Obstetrics                           Anesthesia Physical  Anesthesia Plan  ASA: III  Anesthesia Plan: Spinal   Post-op Pain Management:    Induction: Intravenous  Airway Management Planned:   Additional Equipment:   Intra-op Plan:   Post-operative Plan: Extubation in OR  Informed Consent: I have reviewed the patients History and Physical, chart, labs and discussed the procedure including the risks, benefits and alternatives for the proposed anesthesia with the patient or authorized representative who has indicated his/her understanding and acceptance.   Dental advisory given  Plan Discussed with: Anesthesiologist and CRNA  Anesthesia Plan Comments: (Has been off anticoagulation for 5 days, eliquous for neuraxial protocol is 3 days so neuraxial is safe from epidural hematoma risk)        Anesthesia Quick Evaluation

## 2015-03-08 NOTE — H&P (View-Only) (Signed)
Isaiah Nose PHD DOB: Jul 01, 1934 Married / Language: English / Race: Black or African American Male  Date of Admission:  03/08/2015 CC:  Right Hip Pain History of Present Illness The patient is a 81 year old male who comes in for a preoperative History and Physical. The patient is scheduled for a right total hip arthroplasty (anterior) to be performed by Dr. Gus Rankin. Aluisio, MD at [facility] on 03-08-2015. The patient reports right hip problems including pain symptoms that have been present for month(s). The symptoms began following a specific injury. The injury occured while the patient was while playing tennis. Symptoms reported include pain with weightbearing, night pain and stiffness The patient reports symptoms radiating to the: right groin, right thigh and right knee.The patient feels as if their symptoms are does feel they are worsening. Associated symptoms include weakness of the leg. Current treatment includes nonsteroidal anti-inflammatory drugs. Prior to being seen, the patient was previously evaluated by a colleague. Previous workup for this problem has included hip x-rays and hip MRI. Previous treatment for this problem has included physical therapy. Dr. Willa Rough has had problems with his right hip for quite a while now, but getting much worse in the past year. He is now having difficulty doing anything he desires. He feels like he has loss of motion in the hip. It is hurting him with most activities. Occasionally, it will hurt at rest or at night. He has had intraarticular injection with slight benefit. It did not last for long even if it did benefit him. He would like to get back to a more pain free lifestyle and back to being able to do things he desires. He is from [place], [place] and saw an orthopedist there and was told he had significant arthritis and needed hip replacement. He does spent a lot of time in [place] and has family here. He is wanting to get the hip fixed here.  They have been treated conservatively in the past for the above stated problem and despite conservative measures, they continue to have progressive pain and severe functional limitations and dysfunction. They have failed non-operative management including home exercise, medications, and injections. It is felt that they would benefit from undergoing total joint replacement. Risks and benefits of the procedure have been discussed with the patient and they elect to proceed with surgery. There are no active contraindications to surgery such as ongoing infection or rapidly progressive neurological disease.  Problem List/Past Medical Primary osteoarthritis of right hip (M16.11)  Asthma  Heart murmur  High blood pressure  Hypercholesterolemia  Sleep Apnea  Pacemaker  April 2015 Gastroesophageal Reflux Disease  Hemorrhoids  Allergies Penicillins  Rash. Rash at injection site  Family History Cerebrovascular Accident  Father. Hypertension  Father. Osteoarthritis  Father.  Social History Children  2 Current drinker  01/08/2015: Currently drinks beer and wine only occasionally per week Current work status  working full time Exercise  Exercises weekly Living situation  live with spouse Marital status  married No history of drug/alcohol rehab  Not under pain contract  Number of flights of stairs before winded  greater than 5 Tobacco / smoke exposure  01/08/2015: no Tobacco use  Never smoker. 01/08/2015  Medication History Alvesco (160MCG/ACT Aerosol Soln, Inhalation) Active. Eliquis (2.5MG  Tablet, Oral two times daily) Active. NexIUM (  Capsule DR, Oral as needed) Active. HydroCHLOROthiazide (12.5MG  Capsule, Oral daily) Active. Lisinopril (  Tablet, Oral two times daily) Active. Nasonex (50MCG/ACT Suspension, 1-2 puffs Nasal two times daily) Active.  Montelukast Sodium (  Tablet, Oral daily) Active.  Past Surgical History Hemorrhoidectomy   Pacemaker Placement  Date: 05/2013. Colonoscopy   Review of Systems General Not Present- Chills, Fatigue, Fever, Memory Loss, Night Sweats, Weight Gain and Weight Loss. Skin Present- Itching. Not Present- Eczema, Hives, Lesions and Rash. HEENT Not Present- Dentures, Double Vision, Headache, Hearing Loss, Tinnitus and Visual Loss. Respiratory Not Present- Allergies, Chronic Cough, Coughing up blood, Shortness of breath at rest and Shortness of breath with exertion. Cardiovascular Not Present- Chest Pain, Difficulty Breathing Lying Down, Murmur, Palpitations, Racing/skipping heartbeats and Swelling. Gastrointestinal Not Present- Abdominal Pain, Bloody Stool, Constipation, Diarrhea, Difficulty Swallowing, Heartburn, Jaundice, Loss of appetitie, Nausea and Vomiting. Male Genitourinary Not Present- Blood in Urine, Discharge, Flank Pain, Incontinence, Painful Urination, Urgency, Urinary frequency, Urinary Retention, Urinating at Night and Weak urinary stream. Musculoskeletal Present- Joint Pain. Not Present- Back Pain, Joint Swelling, Morning Stiffness, Muscle Pain, Muscle Weakness and Spasms. Neurological Not Present- Blackout spells, Difficulty with balance, Dizziness, Paralysis, Tremor and Weakness. Psychiatric Not Present- Insomnia.  Vitals Weight: 180 lb Height: 69in Body Surface Area: 1.98 m Body Mass Index: 26.58 kg/m  BP: 112/68 (Sitting, Right Arm, Standard)  Physical Exam General Mental Status -Alert, cooperative and good historian. General Appearance-pleasant, Not in acute distress. Orientation-Oriented X3. Build & Nutrition-Well nourished and Well developed.  Head and Neck Head-normocephalic, atraumatic . Neck Global Assessment - supple, no bruit auscultated on the right, no bruit auscultated on the left.  Eye Vision-Wears corrective lenses(wears readers). Pupil - Bilateral-Regular and Round. Motion - Bilateral-EOMI.  Chest and Lung  Exam Auscultation Breath sounds - clear at anterior chest wall and clear at posterior chest wall. Adventitious sounds - No Adventitious sounds.  Cardiovascular Auscultation Rhythm - Regular rate and rhythm. Heart Sounds - S1 WNL and S2 WNL. Murmurs & Other Heart Sounds - Auscultation of the heart reveals - No Murmurs.  Abdomen Palpation/Percussion Tenderness - Abdomen is non-tender to palpation. Rigidity (guarding) - Abdomen is soft. Auscultation Auscultation of the abdomen reveals - Bowel sounds normal.  Male Genitourinary Note: Not done, not pertinent to present illness   Musculoskeletal Note: He is alert and oriented, in no apparent distress. His left hip has normal range of motion with no discomfort. His right hip can be flexed to 100, minimal internal rotation, about 10 to 20 of external rotation, 20 degrees of abduction. There is no tenderness about the greater trochanter. His knee exam is normal bilaterally. Pulse, sensation and motor intact. He does have a slightly antalgic gait pattern on the right.  RADIOGRAPHS AP pelvis and lateral of the right hip shows significant bone-on-bone arthritis in the right hip with subchondral cystic formation. The left hip looks normal.   Assessment & Plan Primary osteoarthritis of right hip (M16.11)  Note:Surgical Plans: Right Total Hip Replacement - Anterior Approach  Disposition: Home versus Rehab  PCP: Dr. Alden Hipp - Patient has been seen preoperatively and felt to be stable for surgery.  IV TXA  Anesthesia Issues: None  Comment: Patient instructed to top the Eliquis three days before surgery.  Signed electronically by Lauraine Rinne, III PA-C

## 2015-03-09 ENCOUNTER — Encounter (HOSPITAL_COMMUNITY): Payer: Self-pay | Admitting: Orthopedic Surgery

## 2015-03-09 LAB — BASIC METABOLIC PANEL
ANION GAP: 8 (ref 5–15)
BUN: 19 mg/dL (ref 6–20)
CALCIUM: 8.2 mg/dL — AB (ref 8.9–10.3)
CO2: 22 mmol/L (ref 22–32)
Chloride: 103 mmol/L (ref 101–111)
Creatinine, Ser: 1.14 mg/dL (ref 0.61–1.24)
GFR, EST NON AFRICAN AMERICAN: 60 mL/min — AB (ref >60)
Glucose, Bld: 173 mg/dL — ABNORMAL HIGH (ref 65–99)
POTASSIUM: 4.4 mmol/L (ref 3.5–5.1)
SODIUM: 133 mmol/L — AB (ref 135–145)

## 2015-03-09 LAB — CBC
HCT: 34.6 % — ABNORMAL LOW (ref 39.0–52.0)
Hemoglobin: 12.1 g/dL — ABNORMAL LOW (ref 13.0–17.0)
MCH: 33.1 pg (ref 26.0–34.0)
MCHC: 35 g/dL (ref 30.0–36.0)
MCV: 94.5 fL (ref 78.0–100.0)
PLATELETS: 191 10*3/uL (ref 150–400)
RBC: 3.66 MIL/uL — AB (ref 4.22–5.81)
RDW: 12.3 % (ref 11.5–15.5)
WBC: 13.9 10*3/uL — AB (ref 4.0–10.5)

## 2015-03-09 MED ORDER — ESOMEPRAZOLE MAGNESIUM 40 MG PO CPDR
40.0000 mg | DELAYED_RELEASE_CAPSULE | Freq: Every day | ORAL | Status: DC
  Administered 2015-03-09 – 2015-03-11 (×3): 40 mg via ORAL
  Filled 2015-03-09 (×4): qty 1

## 2015-03-09 MED ORDER — NON FORMULARY: 40.0000 mg | Freq: Every day | Status: DC

## 2015-03-09 MED ORDER — SODIUM CHLORIDE 0.9 % IV BOLUS (SEPSIS)
250.0000 mL | Freq: Once | INTRAVENOUS | Status: AC
  Administered 2015-03-09: 250 mL via INTRAVENOUS

## 2015-03-09 NOTE — Clinical Social Work Note (Signed)
Clinical Social Work Assessment  Patient Details  Name: Isaiah Murphy MRN: [id number] Date of Birth: [birth year]  Date of referral:  03/09/15               Reason for consult:  Facility Placement, Discharge Planning                Permission sought to share information with:  Chartered certified accountant granted to share information::  Yes, Verbal Permission Granted  Name::        Agency::     Relationship::     Contact Information:     Housing/Transportation Living arrangements for the past 2 months:  Single Family Home Source of Information:  Patient, Adult Children, Spouse Patient Interpreter Needed:  None Criminal Activity/Legal Involvement Pertinent to Current Situation/Hospitalization:  No - Comment as needed Significant Relationships:  Adult Children, Spouse Lives with:  Spouse Do you feel safe going back to the place where you live?  No (ST Rehab needed.) Need for family participation in patient care:  Yes (Comment)  Care giving concerns:  Pt's care cannot be managed at home following hospital d/c.   Social Worker assessment / plan:  Pt hospitalized on 03/08/15 for pre planned right total hip arthroplasty. CSW met with pt / family to assist with d/c planning. ST Rehab is needed at d/c. Pt / family are in agreement with this plan and have requested [facility]. CSW has contacted SNF and d/c plan has been confirmed. Pt has [facility] insurance which does not have a SNF benefit. [facility] will use pt's Medicare Part A for placement. CSW will continue to follow to assist with d/c planning needs.  Employment status:  Full Time Insurance information:  Medicare, Managed Care PT Recommendations:  [place] / Referral to community resources:  [place]  Patient/Family's Response to care: Pt / family feel ST Rehab is needed.  Patient/Family's Understanding of and Emotional Response to Diagnosis, Current Treatment, and  Prognosis: Pt / family are aware of pt's medical status. Pt is motivated to work with therapy and is pleased that [facility] [facility] has an opening for him at d/c.  Emotional Assessment Appearance:  Appears stated age Attitude/Demeanor/Rapport:  Other (cooperative) Affect (typically observed):  Calm, Appropriate, Pleasant Orientation:  Oriented to Self, Oriented to Place, Oriented to  Time, Oriented to Situation Alcohol / Substance use:    Psych involvement (Current and /or in the community):  No (Comment)  Discharge Needs  Concerns to be addressed:  Discharge Planning Concerns Readmission within the last 30 days:  No Current discharge risk:  None Barriers to Discharge:  No Barriers Identified   Luretha Rued, [place] 03/09/2015, 12:10 PM

## 2015-03-09 NOTE — Progress Notes (Signed)
   Subjective: 1 Day Post-Op Procedure(s) (LRB): RIGHT TOTAL HIP ARTHROPLASTY ANTERIOR APPROACH (Right) Patient reports pain as mild.   Patient seen in rounds with Dr. Lequita Halt.  Family in room. Events of last night reviewd which appears to be a vagal epsiode. Denies any N/V/SOB/CP/dizziness this morning. Patient is well, but has had some minor complaints of pain in the hip, requiring pain medications We will start therapy today.  Plan is to go Skilled nursing facility after hospital stay.  Objective: Vital signs in last 24 hours: Temp:  [97.1 F (36.2 C)-98.3 F (36.8 C)] 98.3 F (36.8 C) (01/31 0436) Pulse Rate:  [69-86] 70 (01/31 0436) Resp:  [12-16] 16 (01/31 0436) BP: (55-139)/(31-96) 91/51 mmHg (01/31 0436) SpO2:  [96 %-100 %] 100 % (01/31 0436) Weight:  [82.101 kg (181 lb)] 82.101 kg (181 lb) (01/30 2000)  Intake/Output from previous day:  Intake/Output Summary (Last 24 hours) at 03/09/15 0714 Last data filed at 03/09/15 0659  Gross per 24 hour  Intake 4375.66 ml  Output   1290 ml  Net 3085.66 ml    Intake/Output this shift: UOP 300  Labs:  Recent Labs  03/08/15 2230 03/09/15 0500  HGB 12.6* 12.1*    Recent Labs  03/08/15 2230 03/09/15 0500  WBC 18.2* 13.9*  RBC 3.90* 3.66*  HCT 36.9* 34.6*  PLT 199 191    Recent Labs  03/09/15 0500  NA 133*  K 4.4  CL 103  CO2 22  BUN 19  CREATININE 1.14  GLUCOSE 173*  CALCIUM 8.2*   No results for input(s): LABPT, INR in the last 72 hours.  EXAM General - Patient is Alert, Appropriate and Oriented Extremity - Neurovascular intact Sensation intact distally Dorsiflexion/Plantar flexion intact Dressing - dressing C/D/I Motor Function - intact, moving foot and toes well on exam.  Hemovac pulled without difficulty.  Past Medical History  Diagnosis Date  . Hypertension   . Dyslipidemia   . Asthma   . Allergic rhinitis   . Presence of permanent cardiac pacemaker   . Sleep apnea     pt states not  currently using CPAP machine   . History of bronchitis   . GERD (gastroesophageal reflux disease)   . Arthritis   . Dysrhythmia     history of paroxysmal A Fib and typical A Flutter   . History of sick sinus syndrome   . History of syncope     Assessment/Plan: 1 Day Post-Op Procedure(s) (LRB): RIGHT TOTAL HIP ARTHROPLASTY ANTERIOR APPROACH (Right) Principal Problem:   OA (osteoarthritis) of hip  Estimated body mass index is 26.35 kg/(m^2) as calculated from the following:   Height as of this encounter: 5' 9.5" (1.765 m).   Weight as of this encounter: 82.101 kg (181 lb). Up with therapy Discharge to SNF  Fluids this morning.  DVT Prophylaxis - Eliquis Weight Bearing As Tolerated right Leg Hemovac Pulled Begin Therapy  Avel Peace, PA-C Orthopaedic Surgery 03/09/2015, 7:14 AM

## 2015-03-09 NOTE — Discharge Summary (Signed)
Physician Discharge Summary   Patient ID: Isaiah Murphy MRN: [id number] DOB/AGE: 09-02-1934 81 y.o.  Admit date: 03/08/2015 Discharge date: 03-11-2015  Primary Diagnosis:  Osteoarthritis of the Right hip Admission Diagnoses:  Past Medical History  Diagnosis Date  . Hypertension   . Dyslipidemia   . Asthma   . Allergic rhinitis   . Presence of permanent cardiac pacemaker   . Sleep apnea     pt states not currently using CPAP machine   . History of bronchitis   . GERD (gastroesophageal reflux disease)   . Arthritis   . Dysrhythmia     history of paroxysmal A Fib and typical A Flutter   . History of sick sinus syndrome   . History of syncope    Discharge Diagnoses:   Principal Problem:   OA (osteoarthritis) of hip  Estimated body mass index is 26.35 kg/(m^2) as calculated from the following:   Height as of this encounter: 5' 9.5" (1.765 m).   Weight as of this encounter: 82.101 kg (181 lb).  Procedure(s) (LRB): RIGHT TOTAL HIP ARTHROPLASTY ANTERIOR APPROACH (Right)   Consults: None  HPI: Isaiah Murphy is a 81 y.o. male who has advanced end-  stage arthritis of his Right hip with progressively worsening pain and  dysfunction.The patient has failed nonoperative management and presents for  total hip arthroplasty.  Laboratory Data: Admission on 03/08/2015  Component Date Value Ref Range Status  . WBC 03/09/2015 13.9* 4.0 - 10.5 K/uL Final  . RBC 03/09/2015 3.66* 4.22 - 5.81 MIL/uL Final  . Hemoglobin 03/09/2015 12.1* 13.0 - 17.0 g/dL Final  . HCT 03/09/2015 34.6* 39.0 - 52.0 % Final  . MCV 03/09/2015 94.5  78.0 - 100.0 fL Final  . MCH 03/09/2015 33.1  26.0 - 34.0 pg Final  . MCHC 03/09/2015 35.0  30.0 - 36.0 g/dL Final  . RDW 03/09/2015 12.3  11.5 - 15.5 % Final  . Platelets 03/09/2015 191  150 - 400 K/uL Final  . Sodium 03/09/2015 133* 135 - 145 mmol/L Final  . Potassium 03/09/2015 4.4  3.5 - 5.1 mmol/L Final  . Chloride 03/09/2015 103  101 - 111 mmol/L Final    . CO2 03/09/2015 22  22 - 32 mmol/L Final  . Glucose, Bld 03/09/2015 173* 65 - 99 mg/dL Final  . BUN 03/09/2015 19  6 - 20 mg/dL Final  . Creatinine, Ser 03/09/2015 1.14  0.61 - 1.24 mg/dL Final  . Calcium 03/09/2015 8.2* 8.9 - 10.3 mg/dL Final  . GFR calc non Af Amer 03/09/2015 60* >60 mL/min Final  . GFR calc Af Amer 03/09/2015 >60  >60 mL/min Final   Comment: (NOTE) The eGFR has been calculated using the CKD EPI equation. This calculation has not been validated in all clinical situations. eGFR's persistently <60 mL/min signify possible Chronic Kidney Disease.   . Anion gap 03/09/2015 8  5 - 15 Final  . Glucose-Capillary 03/08/2015 161* 65 - 99 mg/dL Final  . WBC 03/08/2015 18.2* 4.0 - 10.5 K/uL Final  . RBC 03/08/2015 3.90* 4.22 - 5.81 MIL/uL Final  . Hemoglobin 03/08/2015 12.6* 13.0 - 17.0 g/dL Final  . HCT 03/08/2015 36.9* 39.0 - 52.0 % Final  . MCV 03/08/2015 94.6  78.0 - 100.0 fL Final  . MCH 03/08/2015 32.3  26.0 - 34.0 pg Final  . MCHC 03/08/2015 34.1  30.0 - 36.0 g/dL Final  . RDW 03/08/2015 12.3  11.5 - 15.5 % Final  . Platelets 03/08/2015 199  150 -  400 K/uL Final  . WBC 03/10/2015 21.8* 4.0 - 10.5 K/uL Final  . RBC 03/10/2015 3.38* 4.22 - 5.81 MIL/uL Final  . Hemoglobin 03/10/2015 10.9* 13.0 - 17.0 g/dL Final  . HCT 03/10/2015 32.0* 39.0 - 52.0 % Final  . MCV 03/10/2015 94.7  78.0 - 100.0 fL Final  . MCH 03/10/2015 32.2  26.0 - 34.0 pg Final  . MCHC 03/10/2015 34.1  30.0 - 36.0 g/dL Final  . RDW 03/10/2015 12.5  11.5 - 15.5 % Final  . Platelets 03/10/2015 195  150 - 400 K/uL Final  . Sodium 03/10/2015 132* 135 - 145 mmol/L Final  . Potassium 03/10/2015 4.1  3.5 - 5.1 mmol/L Final  . Chloride 03/10/2015 102  101 - 111 mmol/L Final  . CO2 03/10/2015 23  22 - 32 mmol/L Final  . Glucose, Bld 03/10/2015 142* 65 - 99 mg/dL Final  . BUN 03/10/2015 17  6 - 20 mg/dL Final  . Creatinine, Ser 03/10/2015 0.99  0.61 - 1.24 mg/dL Final  . Calcium 03/10/2015 8.0* 8.9 - 10.3  mg/dL Final  . GFR calc non Af Amer 03/10/2015 >60  >60 mL/min Final  . GFR calc Af Amer 03/10/2015 >60  >60 mL/min Final   Comment: (NOTE) The eGFR has been calculated using the CKD EPI equation. This calculation has not been validated in all clinical situations. eGFR's persistently <60 mL/min signify possible Chronic Kidney Disease.   . Anion gap 03/10/2015 7  5 - 15 Final  . WBC 03/11/2015 16.1* 4.0 - 10.5 K/uL Final  . RBC 03/11/2015 3.76* 4.22 - 5.81 MIL/uL Final  . Hemoglobin 03/11/2015 12.1* 13.0 - 17.0 g/dL Final  . HCT 03/11/2015 35.3* 39.0 - 52.0 % Final  . MCV 03/11/2015 93.9  78.0 - 100.0 fL Final  . MCH 03/11/2015 32.2  26.0 - 34.0 pg Final  . MCHC 03/11/2015 34.3  30.0 - 36.0 g/dL Final  . RDW 03/11/2015 12.7  11.5 - 15.5 % Final  . Platelets 03/11/2015 202  150 - 400 K/uL Final  Hospital Outpatient Visit on 03/04/2015  Component Date Value Ref Range Status  . MRSA, PCR 03/04/2015 NEGATIVE  NEGATIVE Final  . Staphylococcus aureus 03/04/2015 NEGATIVE  NEGATIVE Final   Comment:        The Xpert SA Assay (FDA approved for NASAL specimens in patients over 79 years of age), is one component of a comprehensive surveillance program.  Test performance has been validated by [facility] for patients greater than or equal to 81 year old. It is not intended to diagnose infection nor to guide or monitor treatment.   Marland Kitchen aPTT 03/04/2015 37  24 - 37 seconds Final   Comment:        IF BASELINE aPTT IS ELEVATED, SUGGEST PATIENT RISK ASSESSMENT BE USED TO DETERMINE APPROPRIATE ANTICOAGULANT THERAPY.   . WBC 03/04/2015 9.2  4.0 - 10.5 K/uL Final  . RBC 03/04/2015 4.64  4.22 - 5.81 MIL/uL Final  . Hemoglobin 03/04/2015 15.1  13.0 - 17.0 g/dL Final  . HCT 03/04/2015 43.3  39.0 - 52.0 % Final  . MCV 03/04/2015 93.3  78.0 - 100.0 fL Final  . MCH 03/04/2015 32.5  26.0 - 34.0 pg Final  . MCHC 03/04/2015 34.9  30.0 - 36.0 g/dL Final  . RDW 03/04/2015 12.5  11.5 - 15.5 % Final  .  Platelets 03/04/2015 255  150 - 400 K/uL Final  . Sodium 03/04/2015 132* 135 - 145 mmol/L Final  . Potassium  03/04/2015 4.3  3.5 - 5.1 mmol/L Final  . Chloride 03/04/2015 97* 101 - 111 mmol/L Final  . CO2 03/04/2015 27  22 - 32 mmol/L Final  . Glucose, Bld 03/04/2015 99  65 - 99 mg/dL Final  . BUN 03/04/2015 19  6 - 20 mg/dL Final  . Creatinine, Ser 03/04/2015 1.02  0.61 - 1.24 mg/dL Final  . Calcium 03/04/2015 9.1  8.9 - 10.3 mg/dL Final  . Total Protein 03/04/2015 8.2* 6.5 - 8.1 g/dL Final  . Albumin 03/04/2015 4.3  3.5 - 5.0 g/dL Final  . AST 03/04/2015 30  15 - 41 U/L Final  . ALT 03/04/2015 21  17 - 63 U/L Final  . Alkaline Phosphatase 03/04/2015 116  38 - 126 U/L Final  . Total Bilirubin 03/04/2015 1.2  0.3 - 1.2 mg/dL Final  . GFR calc non Af Amer 03/04/2015 >60  >60 mL/min Final  . GFR calc Af Amer 03/04/2015 >60  >60 mL/min Final   Comment: (NOTE) The eGFR has been calculated using the CKD EPI equation. This calculation has not been validated in all clinical situations. eGFR's persistently <60 mL/min signify possible Chronic Kidney Disease.   . Anion gap 03/04/2015 8  5 - 15 Final  . Prothrombin Time 03/04/2015 15.0  11.6 - 15.2 seconds Final  . INR 03/04/2015 1.21  0.00 - 1.49 Final  . ABO/RH(D) 03/04/2015 O POS   Final  . Antibody Screen 03/04/2015 NEG   Final  . Sample Expiration 03/04/2015 03/11/2015   Final  . Extend sample reason 03/04/2015 NO TRANSFUSIONS OR PREGNANCY IN THE PAST 3 MONTHS   Final  . Color, Urine 03/04/2015 YELLOW  YELLOW Final  . APPearance 03/04/2015 CLEAR  CLEAR Final  . Specific Gravity, Urine 03/04/2015 1.024  1.005 - 1.030 Final  . pH 03/04/2015 5.0  5.0 - 8.0 Final  . Glucose, UA 03/04/2015 NEGATIVE  NEGATIVE mg/dL Final  . Hgb urine dipstick 03/04/2015 SMALL* NEGATIVE Final  . Bilirubin Urine 03/04/2015 NEGATIVE  NEGATIVE Final  . Ketones, ur 03/04/2015 NEGATIVE  NEGATIVE mg/dL Final  . Protein, ur 03/04/2015 NEGATIVE  NEGATIVE mg/dL Final   . Nitrite 03/04/2015 NEGATIVE  NEGATIVE Final  . Leukocytes, UA 03/04/2015 NEGATIVE  NEGATIVE Final  . ABO/RH(D) 03/04/2015 O POS   Final  . Squamous Epithelial / LPF 03/04/2015 0-5* NONE SEEN Final  . WBC, UA 03/04/2015 0-5  0 - 5 WBC/hpf Final  . RBC / HPF 03/04/2015 0-5  0 - 5 RBC/hpf Final  . Bacteria, UA 03/04/2015 FEW* NONE SEEN Final  . Urine-Other 03/04/2015 MUCOUS PRESENT   Final     X-Rays:Dg Pelvis Portable  03/08/2015  CLINICAL DATA:  Postop right anterior hip arthroplasty. EXAM: PORTABLE PELVIS 1-2 VIEWS COMPARISON:  None. FINDINGS: Single AP view demonstrates recent right total hip arthroplasty. A surgical drain is in place. The hardware is well positioned. There is no evidence of acute fracture or dislocation. There is some gas within the soft tissues surrounding the right hip. Vascular calcifications are noted. IMPRESSION: No demonstrated complication following right total hip arthroplasty. Electronically Signed   By: Richardean Sale M.D.   On: 03/08/2015 19:33   Dg C-arm 1-60 Min-no Report  03/08/2015  CLINICAL DATA: surgery C-ARM 1-60 MINUTES Fluoroscopy was utilized by the requesting physician.  No radiographic interpretation.    EKG: Orders placed or performed during the hospital encounter of 03/08/15  . EKG 12-Lead  . EKG 12-Lead     Hospital Course: Patient was  admitted to [facility] and taken to the OR and underwent the above state procedure without complications.  Patient tolerated the procedure well and was later transferred to the recovery room and then to the orthopaedic floor for postoperative care.  They were given PO and IV analgesics for pain control following their surgery.  They were given 24 hours of postoperative antibiotics of  Anti-infectives    Start     Dose/Rate Route Frequency Ordered Stop   03/09/15 0600  ceFAZolin (ANCEF) IVPB 2 g/50 mL premix     2 g 100 mL/hr over 30 Minutes Intravenous On call to O.R. 03/08/15 1254 03/08/15 1653    03/08/15 2230  ceFAZolin (ANCEF) IVPB 2 g/50 mL premix     2 g 100 mL/hr over 30 Minutes Intravenous Every 6 hours 03/08/15 2013 03/09/15 0416     and started on DVT prophylaxis in the form of Eliquis.   PT and OT were ordered for total hip protocol.  The patient was allowed to be WBAT with therapy. Discharge planning was consulted to help with postop disposition and equipment needs.  Patient had a tough night on the evening of surgery.  At 22:00, Low blood pressure reported 55/31. The evening staff found the patient unresponsive but with a strong pulse. Called code team. Found patient sitting on bedpan. Patient responsive with eyes open and spoke. Code team arrived and medical steps taken. IV fluids bolus, EKG, head lower, CBC, and CBG done. Dr. Maryruth Bun spoke to the PA on call regarding the patient's condition. Patient improved and became alert.BP improved. They started to get up OOB with therapy on day one and walked about 50 feet.  Hemovac drain was pulled without difficulty.  Continued to work with therapy into day two.  Dressing was changed on day two and the incision was healing well.  By day three, the patient had progressed with therapy and meeting their goals.  Incision was healing well. No complaints.  Patient was seen in rounds by Dr. Wynelle Link  and was ready to go to the SNF.  Discharge to SNF Diet - Cardiac diet Follow up - in 2 weeks on Tuesday 2.14.2017 Activity - WBAT Disposition - Skilled nursing facility Condition Upon Discharge - Good D/C Meds - See DC Summary DVT Prophylaxis - Eliquis      Discharge Instructions    Call MD / Call 911    Complete by:  As directed   If you experience chest pain or shortness of breath, CALL 911 and be transported to the hospital emergency room.  If you develope a fever above 101 F, pus (white drainage) or increased drainage or redness at the wound, or calf pain, call your surgeon's office.     Change dressing    Complete by:  As directed    You may change your dressing dressing daily with sterile 4 x 4 inch gauze dressing and paper tape.  Do not submerge the incision under water.     Constipation Prevention    Complete by:  As directed   Drink plenty of fluids.  Prune juice may be helpful.  You may use a stool softener, such as Colace (over the counter) 100 mg twice a day.  Use MiraLax (over the counter) for constipation as needed.     Diet - low sodium heart healthy    Complete by:  As directed      Discharge instructions    Complete by:  As directed  Pick up stool softner and laxative for home use following surgery while on pain medications. Do not submerge incision under water. Please use good hand washing techniques while changing dressing each day. May shower starting three days after surgery. Please use a clean towel to pat the incision dry following showers. Continue to use ice for pain and swelling after surgery. Do not use any lotions or creams on the incision until instructed by your surgeon.  Resume the full dose home dosing of Eliquis at time of transfer to [facility].  Postoperative Constipation Protocol  Constipation - defined medically as fewer than three stools per week and severe constipation as less than one stool per week.  One of the most common issues patients have following surgery is constipation.  Even if you have a regular bowel pattern at home, your normal regimen is likely to be disrupted due to multiple reasons following surgery.  Combination of anesthesia, postoperative narcotics, change in appetite and fluid intake all can affect your bowels.  In order to avoid complications following surgery, here are some recommendations in order to help you during your recovery period.  Colace (docusate) - Pick up an over-the-counter form of Colace or another stool softener and take twice a day as long as you are requiring postoperative pain medications.  Take with a full glass of water daily.  If you  experience loose stools or diarrhea, hold the colace until you stool forms back up.  If your symptoms do not get better within 1 week or if they get worse, check with your doctor.  Dulcolax (bisacodyl) - Pick up over-the-counter and take as directed by the product packaging as needed to assist with the movement of your bowels.  Take with a full glass of water.  Use this product as needed if not relieved by Colace only.   MiraLax (polyethylene glycol) - Pick up over-the-counter to have on hand.  MiraLax is a solution that will increase the amount of water in your bowels to assist with bowel movements.  Take as directed and can mix with a glass of water, juice, soda, coffee, or tea.  Take if you go more than two days without a movement. Do not use MiraLax more than once per day. Call your doctor if you are still constipated or irregular after using this medication for 7 days in a row.  If you continue to have problems with postoperative constipation, please contact the office for further assistance and recommendations.  If you experience "the worst abdominal pain ever" or develop nausea or vomiting, please contact the office immediatly for further recommendations for treatment.  When discharged from the skilled rehab facility, please have the facility set up the patient's [place] prior to being released.  Please make sure this gets set up prior to release in order to avoid any lapse of therapy following the rehab stay.  Also provide the patient with their medications at time of release from the facility to include their pain medication, the muscle relaxants, and their blood thinner medication.  If the patient is still at the rehab facility at time of follow up appointment, please also assist the patient in arranging follow up appointment in our office and any transportation needs. ICE to the affected knee or hip every three hours for 30 minutes at a time and then as needed for pain and  swelling.      Do not sit on low chairs, stoools or toilet seats, as it may  be difficult to get up from low surfaces    Complete by:  As directed      Driving restrictions    Complete by:  As directed   No driving until released by the physician.     Increase activity slowly as tolerated    Complete by:  As directed      Lifting restrictions    Complete by:  As directed   No lifting until released by the physician.     Patient may shower    Complete by:  As directed   You may shower without a dressing once there is no drainage.  Do not wash over the wound.  If drainage remains, do not shower until drainage stops.     TED hose    Complete by:  As directed   Use stockings (TED hose) for 3 weeks on both leg(s).  You may remove them at night for sleeping.     Weight bearing as tolerated    Complete by:  As directed   Laterality:  right  Extremity:  Lower            Medication List    STOP taking these medications        azithromycin 250 MG tablet  Commonly known as:  ZITHROMAX     MEGA MULTI MEN PO     Vitamin D (Ergocalciferol) 50000 units Caps capsule  Commonly known as:  DRISDOL      TAKE these medications        acetaminophen 325 MG tablet  Commonly known as:  TYLENOL  Take 2 tablets (650 mg total) by mouth every 6 (six) hours as needed for mild pain (or Fever >/= 101).     albuterol 108 (90 Base) MCG/ACT inhaler  Commonly known as:  PROAIR HFA  Inhale 2 puffs into the lungs every 4 (four) hours as needed for wheezing or shortness of breath (cough).     amLODipine 10 MG tablet  Commonly known as:  NORVASC  Take 10 mg by mouth every morning.     apixaban 5 MG Tabs tablet  Commonly known as:  ELIQUIS  Take 1 tablet (5 mg total) by mouth 2 (two) times daily.     aspirin 81 MG EC tablet  Take 1 tablet (81 mg total) by mouth daily.     atorvastatin 20 MG tablet  Commonly known as:  LIPITOR  Take 20 mg by mouth at bedtime.     bisacodyl 10 MG suppository    Commonly known as:  DULCOLAX  Place 1 suppository (10 mg total) rectally daily as needed for moderate constipation.     chlorpheniramine-HYDROcodone 10-8 MG/5ML Suer  Commonly known as:  TUSSIONEX PENNKINETIC ER  Take 5 mLs by mouth every 12 (twelve) hours as needed for cough.     ciclesonide 160 MCG/ACT inhaler  Commonly known as:  ALVESCO  Use one puff twice daily to prevent cough or wheeze.  Increase to 2 puffs twice daily with asthma flare. Rinse, gargle and spit after use     diphenhydrAMINE 12.5 MG/5ML elixir  Commonly known as:  BENADRYL  Take 5-10 mLs (12.5-25 mg total) by mouth every 4 (four) hours as needed for itching.     docusate sodium 100 MG capsule  Commonly known as:  COLACE  Take 1 capsule (100 mg total) by mouth 2 (two) times daily.     esomeprazole 40 MG capsule  Commonly known as:  NEXIUM  Take 40  mg by mouth daily. Reported on 02/04/2015     hydrochlorothiazide 25 MG tablet  Commonly known as:  HYDRODIURIL  Take 25 mg by mouth at bedtime.     lisinopril 40 MG tablet  Commonly known as:  PRINIVIL,ZESTRIL  Take 40 mg by mouth 2 (two) times daily.     methocarbamol 500 MG tablet  Commonly known as:  ROBAXIN  Take 1 tablet (500 mg total) by mouth every 6 (six) hours as needed for muscle spasms.     metoCLOPramide 5 MG tablet  Commonly known as:  REGLAN  Take 1 tablet (5 mg total) by mouth every 8 (eight) hours as needed for nausea (if ondansetron (ZOFRAN) ineffective.).     mometasone 50 MCG/ACT nasal spray  Commonly known as:  NASONEX  Place 2 sprays into the nose daily.     montelukast 10 MG tablet  Commonly known as:  SINGULAIR  Take 10 mg by mouth at bedtime.     olopatadine 0.1 % ophthalmic solution  Commonly known as:  PATANOL  Place 1 drop into both eyes 2 (two) times daily.     ondansetron 4 MG tablet  Commonly known as:  ZOFRAN  Take 1 tablet (4 mg total) by mouth every 6 (six) hours as needed for nausea.     oxyCODONE 5 MG immediate  release tablet  Commonly known as:  Oxy IR/ROXICODONE  Take 1-2 tablets (5-10 mg total) by mouth every 3 (three) hours as needed for moderate pain or severe pain.     polyethylene glycol packet  Commonly known as:  MIRALAX / GLYCOLAX  Take 17 g by mouth daily as needed for mild constipation.     sodium phosphate 7-19 GM/118ML Enem  Place 133 mLs (1 enema total) rectally once as needed for severe constipation.     traMADol 50 MG tablet  Commonly known as:  ULTRAM  Take 1-2 tablets (50-100 mg total) by mouth every 6 (six) hours as needed (mild pain).     triamcinolone cream 0.1 %  Commonly known as:  KENALOG  Apply 1 application topically 2 (two) times daily as needed (itching). Reported on 02/04/2015       Follow-up Information    Follow up with [facility] .   Specialty:  Skilled Nursing Facility   Contact information:   [place] [phone]      Follow up with Gearlean Alf, MD. Schedule an appointment as soon as possible for a visit on 03/23/2015.   Specialty:  Orthopedic Surgery   Why:  Call office ASAP at [phone] to setup appointment on Tuesday 03/23/2015 with Dr. Wynelle Link.   Contact information:   [address] [place] [address] [phone]       Signed: Arlee Muslim, PA-C Orthopaedic Surgery 03/11/2015, 10:01 AM

## 2015-03-09 NOTE — Care Management Note (Signed)
Case Management Note  Patient Details  Name: Isaiah Murphy MRN: [id number] Date of Birth: 1935/01/12  Subjective/Objective:                  RIGHT TOTAL HIP ARTHROPLASTY ANTERIOR APPROACH (Right) Action/Plan: Discharge planning Expected Discharge Date:                 Expected Discharge Plan:  Skilled Nursing Facility  In-House Referral:     Discharge planning Services  CM Consult  Post Acute Care Choice:    Choice offered to:     DME Arranged:    DME Agency:     HH Arranged:    HH Agency:     Status of Service:  Completed, signed off  Medicare Important Message Given:    Date Medicare IM Given:    Medicare IM give by:    Date Additional Medicare IM Given:    Additional Medicare Important Message give by:     If discussed at Long Length of Stay Meetings, dates discussed:    Additional Comments: CM notes pt to go to SNF; CSW arranging.  No other CM needs were communicated. Yves Dill, RN 03/09/2015, 3:38 PM

## 2015-03-09 NOTE — Progress Notes (Signed)
Physical Therapy Treatment Note    03/09/15 1400  PT Visit Information  Last PT Received On 03/09/15  Assistance Needed +1  History of Present Illness Pt is a 81 year old male s/p R direct anterior THA with hx of syncope, permanent cardiac pacemaker.  PT Time Calculation  PT Start Time (ACUTE ONLY) 1400  PT Stop Time (ACUTE ONLY) 1420  PT Time Calculation (min) (ACUTE ONLY) 20 min  Subjective Data  Subjective Pt ambulated in hallway and tolerated well with no c/o dizziness.  Precautions  Precautions Fall  Restrictions  Other Position/Activity Restrictions WBAT  Pain Assessment  Pain Assessment 0-10  Pain Score 3  Pain Location R hip  Pain Descriptors / Indicators Aching;Sore  Pain Intervention(s) Limited activity within patient's tolerance;Monitored during session;Repositioned  Cognition  Arousal/Alertness Awake/alert  Behavior During Therapy WFL for tasks assessed/performed  Overall Cognitive Status Within Functional Limits for tasks assessed  Bed Mobility  Overal bed mobility Needs Assistance  Bed Mobility Supine to Sit;Sit to Supine  Supine to sit Min assist;HOB elevated  Sit to supine Min assist  General bed mobility comments assist for R LE, verbal cues for technique, increased time to perform  Transfers  Overall transfer level Needs assistance  Equipment used Rolling walker (2 wheeled)  Transfers Sit to/from Stand  Sit to Stand Min assist  General transfer comment verbal cues for UE and LE positioning  Ambulation/Gait  Ambulation/Gait assistance Min assist  Ambulation Distance (Feet) 50 Feet  Assistive device Rolling walker (2 wheeled)  Gait Pattern/deviations Step-to pattern;Antalgic;Trunk flexed  General Gait Details verbal cues for sequence, step length, RW positinoing, difficulty with R hip flexion so lead with L LE   PT - End of Session  Equipment Utilized During Treatment Gait belt  Activity Tolerance Patient tolerated treatment well  Patient left in  bed;with call bell/phone within reach;with family/visitor present;with bed alarm set  PT - Assessment/Plan  PT Plan Current plan remains appropriate  PT Frequency (ACUTE ONLY) 7X/week  Follow Up Recommendations SNF  PT equipment None recommended by PT  PT Goal Progression  Progress towards PT goals Progressing toward goals  PT General Charges  $$ ACUTE PT VISIT 1 Procedure  PT Treatments  $Gait Training 8-22 mins   Zenovia Jarred, PT, DPT 03/09/2015 Pager: [phone]

## 2015-03-09 NOTE — [place] FL2 (Signed)
[place] MEDICAID FL2 LEVEL OF CARE SCREENING TOOL     IDENTIFICATION  Patient Name: Isaiah Murphy Birthdate: [birth year] Sex: male Admission Date (Current Location): 03/08/2015  [facility] and [place] Number:  Producer, television/film/video and Address:  [facility],  [address], [place] [address]      Provider Number: [id number]  Attending Physician Name and Address:  Ollen Gross, MD  Relative Name and Phone Number:       Current Level of Care: Hospital Recommended Level of Care: Skilled Nursing Facility Prior Approval Number:    Date Approved/Denied:   PASRR Number: [id number]  Discharge Plan: SNF    Current Diagnoses: Patient Active Problem List   Diagnosis Date Noted  . OA (osteoarthritis) of hip 03/08/2015  . Asthma 02/04/2015  . Atrial flutter (HCC) 04/25/2013  . Near syncope 04/25/2013  . HTN (hypertension) 04/25/2013  . Dyslipidemia 04/25/2013    Orientation RESPIRATION BLADDER Height & Weight     Self, Time, Situation, Place  Normal Continent Weight: 82.101 kg (181 lb) Height:  5' 9.5" (176.5 cm)  BEHAVIORAL SYMPTOMS/MOOD NEUROLOGICAL BOWEL NUTRITION STATUS  Other (Comment) (No Behaviors)   Continent Diet  AMBULATORY STATUS COMMUNICATION OF NEEDS Skin   Extensive Assist Verbally Surgical wounds                       Personal Care Assistance Level of Assistance  Bathing, Feeding, Dressing Bathing Assistance: Limited assistance Feeding assistance: Independent Dressing Assistance: Limited assistance     Functional Limitations Info  Sight, Hearing, Speech Sight Info: Adequate Hearing Info: Adequate Speech Info: Adequate    SPECIAL CARE FACTORS FREQUENCY  PT (By licensed PT), OT (By licensed OT)     PT Frequency: 5 x wk OT Frequency: 5 x wk            Contractures Contractures Info: Not present    Additional Factors Info  Code Status Code Status Info: Full Code             Current Medications (03/09/2015):  This  is the current hospital active medication list Current Facility-Administered Medications  Medication Dose Route Frequency Provider Last Rate Last Dose  . 0.9 %  sodium chloride infusion   Intravenous Continuous Avel Peace, PA-C 100 mL/hr at 03/08/15 2310    . acetaminophen (TYLENOL) tablet 650 mg  650 mg Oral Q6H PRN Ollen Gross, MD       Or  . acetaminophen (TYLENOL) suppository 650 mg  650 mg Rectal Q6H PRN Ollen Gross, MD      . acetaminophen (TYLENOL) tablet 1,000 mg  1,000 mg Oral 4 times per day Ollen Gross, MD   1,000 mg at 03/09/15 0706  . albuterol (PROVENTIL) (2.5 MG/3ML) 0.083% nebulizer solution 3 mL  3 mL Inhalation Q4H PRN Ollen Gross, MD      . amLODipine (NORVASC) tablet 10 mg  10 mg Oral q morning - 10a Avel Peace, PA-C      . apixaban (ELIQUIS) tablet 2.5 mg  2.5 mg Oral Q12H Ollen Gross, MD   2.5 mg at 03/09/15 0759  . atorvastatin (LIPITOR) tablet 20 mg  20 mg Oral QHS Ollen Gross, MD      . bisacodyl (DULCOLAX) suppository 10 mg  10 mg Rectal Daily PRN Ollen Gross, MD      . budesonide (PULMICORT) nebulizer solution 1 mg  1 mg Nebulization BID PRN Ollen Gross, MD      . dexamethasone (DECADRON)  injection 10 mg  10 mg Intravenous Once Ollen Gross, MD      . diphenhydrAMINE (BENADRYL) 12.5 MG/5ML elixir 12.5-25 mg  12.5-25 mg Oral Q4H PRN Ollen Gross, MD      . docusate sodium (COLACE) capsule 100 mg  100 mg Oral BID Ollen Gross, MD   100 mg at 03/08/15 2323  . esomeprazole (NEXIUM) capsule 40 mg  40 mg Oral Q1200 Ollen Gross, MD      . fluticasone [facility]) 50 MCG/ACT nasal spray 2 spray  2 spray Each Nare Daily Ollen Gross, MD      . hydrochlorothiazide (HYDRODIURIL) tablet 25 mg  25 mg Oral QHS Avel Peace, PA-C   25 mg at 03/08/15 2326  . menthol-cetylpyridinium (CEPACOL) lozenge 3 mg  1 lozenge Oral PRN Ollen Gross, MD       Or  . phenol (CHLORASEPTIC) mouth spray 1 spray  1 spray Mouth/Throat PRN Ollen Gross, MD      . methocarbamol  (ROBAXIN) tablet 500 mg  500 mg Oral Q6H PRN Ollen Gross, MD       Or  . methocarbamol (ROBAXIN) 500 mg in dextrose 5 % 50 mL IVPB  500 mg Intravenous Q6H PRN Ollen Gross, MD   500 mg at 03/08/15 1943  . metoCLOPramide (REGLAN) tablet 5-10 mg  5-10 mg Oral Q8H PRN Ollen Gross, MD       Or  . metoCLOPramide (REGLAN) injection 5-10 mg  5-10 mg Intravenous Q8H PRN Ollen Gross, MD      . montelukast (SINGULAIR) tablet 10 mg  10 mg Oral QHS Ollen Gross, MD      . morphine 2 MG/ML injection 1 mg  1 mg Intravenous Q2H PRN Ollen Gross, MD      . olopatadine (PATANOL) 0.1 % ophthalmic solution 1 drop  1 drop Both Eyes BID Ollen Gross, MD   1 drop at 03/08/15 2323  . ondansetron (ZOFRAN) tablet 4 mg  4 mg Oral Q6H PRN Ollen Gross, MD       Or  . ondansetron [facility]) injection 4 mg  4 mg Intravenous Q6H PRN Ollen Gross, MD      . oxyCODONE (Oxy IR/ROXICODONE) immediate release tablet 5-10 mg  5-10 mg Oral Q3H PRN Ollen Gross, MD   5 mg at 03/09/15 0707  . polyethylene glycol (MIRALAX / GLYCOLAX) packet 17 g  17 g Oral Daily PRN Ollen Gross, MD      . sodium phosphate (FLEET) 7-19 GM/118ML enema 1 enema  1 enema Rectal Once PRN Ollen Gross, MD      . traMADol Janean Sark) tablet 50-100 mg  50-100 mg Oral Q6H PRN Ollen Gross, MD   50 mg at 03/09/15 0205     Discharge Medications: Please see discharge summary for a list of discharge medications.  Relevant Imaging Results:  Relevant Lab Results:   Additional Information SS # [id number].  04/26/13 + MRSA PCR ( no encounter )  Taylour Lietzke, Dickey Gave, LCSW

## 2015-03-09 NOTE — Evaluation (Signed)
Occupational Therapy Evaluation Patient Details Name: Isaiah Murphy MRN: [id number] DOB: 06/10/1934 Today's Date: 03/09/2015    History of Present Illness s/p R DA THA   Clinical Impression   This 81 year old man was admitted for the above surgery.  He will benefit from continued OT in acute setting as well as SNF.  Goals in acute are for min guard to min A; LB dressing is deferred to next venue. He needs up to max A for ADLs at this time    Follow Up Recommendations  SNF    Equipment Recommendations  3 in 1 bedside comode (likely)    Recommendations for Other Services       Precautions / Restrictions Precautions Precautions: Fall Restrictions Weight Bearing Restrictions: No      Mobility Bed Mobility Overal bed mobility: Needs Assistance           General bed mobility comments: oob  Transfers Overall transfer level: Needs assistance Equipment used: Rolling walker (2 wheeled) Transfers: Sit to/from Stand Sit to Stand: Min assist Stand pivot transfers: Min assist       General transfer comment: cues for UE/LE placement    Balance                                            ADL Overall ADL's : Needs assistance/impaired     Grooming: Set up;Sitting   Upper Body Bathing: Set up;Sitting   Lower Body Bathing: Minimal assistance;Sit to/from stand   Upper Body Dressing : Minimal assistance;Sitting (lines)   Lower Body Dressing: Maximal assistance;Sit to/from stand                 General ADL Comments: did not perform transfer this session; stood only.  Pt with decreased BP today. Educated on AE and pt tried sock aide, only on LLE.  Pt needs assistance to advance and lift LLE He has a suction cup reacher at home     Vision     Perception     Praxis      Pertinent Vitals/Pain Pain Assessment: 0-10 Pain Score: 3  Pain Location: R hip Pain Descriptors / Indicators: Sore Pain Intervention(s): Limited activity within  patient's tolerance;Monitored during session;Premedicated before session;Repositioned     Hand Dominance     Extremity/Trunk Assessment Upper Extremity Assessment Upper Extremity Assessment: Overall WFL for tasks assessed (IV in L hand in bad position to push through when standing)          Communication Communication Communication: No difficulties   Cognition Arousal/Alertness: Awake/alert Behavior During Therapy: WFL for tasks assessed/performed Overall Cognitive Status: Within Functional Limits for tasks assessed                     General Comments       Exercises       Shoulder Instructions      Home Living Family/patient expects to be discharged to:: Skilled nursing facility Living Arrangements: Spouse/significant other                                      Prior Functioning/Environment Level of Independence: Independent        Comments: likes to play tennis    OT Diagnosis: Generalized weakness   OT Problem List: Decreased strength;Decreased  knowledge of use of DME or AE;Pain (decreased BP)   OT Treatment/Interventions: Self-care/ADL training;DME and/or AE instruction;Patient/family education    OT Goals(Current goals can be found in the care plan section) Acute Rehab OT Goals Patient Stated Goal: get back to playing tennis OT Goal Formulation: With patient Time For Goal Achievement: 03/16/15 Potential to Achieve Goals: Good ADL Goals Pt Will Perform Grooming: with supervision;standing Pt Will Perform Lower Body Bathing: with min guard assist;with adaptive equipment;sit to/from stand Pt Will Transfer to Toilet: with min guard assist;ambulating;bedside commode Pt Will Perform Toileting - Clothing Manipulation and hygiene: with min guard assist;sit to/from stand  OT Frequency: Min 2X/week   Barriers to D/C:            Co-evaluation              End of Session    Activity Tolerance: Patient tolerated treatment  well Patient left: in chair;with call bell/phone within reach   Time: 1117-1131 OT Time Calculation (min): 14 min Charges:  OT General Charges $OT Visit: 1 Procedure OT Evaluation $OT Eval Low Complexity: 1 Procedure G-Codes:    Keiyon Plack 03/11/2015, 11:40 AM   Marica Otter, OTR/L [phone] Mar 11, 2015

## 2015-03-09 NOTE — Evaluation (Signed)
Physical Therapy Evaluation Patient Details Name: Isaiah Murphy MRN: [id number] DOB: 1934/03/15 Today's Date: 03/09/2015   History of Present Illness  Pt is a 81 year old male s/p R direct anterior THA with hx of syncope, permanent cardiac pacemaker.  Clinical Impression  Pt is s/p R THA resulting in the deficits listed below (see PT Problem List).  Pt will benefit from skilled PT to increase their independence and safety with mobility to allow discharge to the venue listed below.  Pt's orthostatics obtained and documented in flowsheets.  Pt denies dizziness with mobility however only assisted to recliner this morning for safety.  Will attempt to progress to ambulation next visit.  Pt plans to d/c to SNF for further rehab prior to home.                  Follow Up Recommendations SNF    Equipment Recommendations  None recommended by PT    Recommendations for Other Services       Precautions / Restrictions Precautions Precautions: Fall Restrictions Weight Bearing Restrictions: No Other Position/Activity Restrictions: WBAT      Mobility  Bed Mobility Overal bed mobility: Needs Assistance Bed Mobility: Supine to Sit     Supine to sit: Min assist;HOB elevated     General bed mobility comments: assist for R LE, verbal cues for technique, increased time to perform  Transfers Overall transfer level: Needs assistance Equipment used: Rolling walker (2 wheeled) Transfers: Sit to/from [employer] Sit to Stand: Min assist Stand pivot transfers: Min assist       General transfer comment: verbal cues for UE and LE positioning, orthostatics obtained and documented in flowsheets, pt denies dizziness, for safety only up to recliner this morning.  Ambulation/Gait                Stairs            Wheelchair Mobility    Modified Rankin (Stroke Patients Only)       Balance                                             Pertinent  Vitals/Pain Pain Assessment: 0-10 Pain Score: 3  Pain Location: R hip Pain Descriptors / Indicators: Sore Pain Intervention(s): Limited activity within patient's tolerance;Monitored during session;Premedicated before session;Repositioned    Home Living Family/patient expects to be discharged to:: Skilled nursing facility Living Arrangements: Spouse/significant other                    Prior Function Level of Independence: Independent         Comments: likes to play tennis     Hand Dominance        Extremity/Trunk Assessment   Upper Extremity Assessment: Overall WFL for tasks assessed (IV in L hand in bad position to push through when standing)           Lower Extremity Assessment: RLE deficits/detail RLE Deficits / Details: decreased functional hip strength observed       Communication   Communication: No difficulties  Cognition Arousal/Alertness: Awake/alert Behavior During Therapy: WFL for tasks assessed/performed Overall Cognitive Status: Within Functional Limits for tasks assessed                      General Comments      Exercises Total Joint Exercises  Ankle Circles/Pumps: AROM;Both;10 reps Quad Sets: AROM;Both;10 reps Short Arc Quad: AROM;Right;10 reps Heel Slides: AAROM;Right;10 reps Hip ABduction/ADduction: AAROM;Right;10 reps      Assessment/Plan    PT Assessment Patient needs continued PT services  PT Diagnosis Difficulty walking;Acute pain   PT Problem List Decreased strength;Decreased range of motion;Decreased mobility;Decreased activity tolerance;Pain;Decreased knowledge of use of DME  PT Treatment Interventions Functional mobility training;Gait training;DME instruction;Patient/family education;Therapeutic activities;Therapeutic exercise   PT Goals (Current goals can be found in the Care Plan section) Acute Rehab PT Goals Patient Stated Goal: get back to playing tennis PT Goal Formulation: With patient Time For Goal  Achievement: 03/13/15 Potential to Achieve Goals: Good    Frequency 7X/week   Barriers to discharge        Co-evaluation               End of Session Equipment Utilized During Treatment: Gait belt Activity Tolerance: Patient tolerated treatment well Patient left: in chair;with call bell/phone within reach           Time: 0951-1018 PT Time Calculation (min) (ACUTE ONLY): 27 min   Charges:   PT Evaluation $PT Eval Moderate Complexity: 1 Procedure PT Treatments $Therapeutic Exercise: 8-22 mins   PT G Codes:        Janis Sol,KATHrine E 03/09/2015, 12:15 PM  Zenovia Jarred, PT, DPT 03/09/2015 Pager: [phone]

## 2015-03-09 NOTE — Progress Notes (Signed)
Utilization review completed.  

## 2015-03-10 LAB — CBC
HEMATOCRIT: 32 % — AB (ref 39.0–52.0)
Hemoglobin: 10.9 g/dL — ABNORMAL LOW (ref 13.0–17.0)
MCH: 32.2 pg (ref 26.0–34.0)
MCHC: 34.1 g/dL (ref 30.0–36.0)
MCV: 94.7 fL (ref 78.0–100.0)
Platelets: 195 10*3/uL (ref 150–400)
RBC: 3.38 MIL/uL — ABNORMAL LOW (ref 4.22–5.81)
RDW: 12.5 % (ref 11.5–15.5)
WBC: 21.8 10*3/uL — ABNORMAL HIGH (ref 4.0–10.5)

## 2015-03-10 LAB — BASIC METABOLIC PANEL
Anion gap: 7 (ref 5–15)
BUN: 17 mg/dL (ref 6–20)
CALCIUM: 8 mg/dL — AB (ref 8.9–10.3)
CO2: 23 mmol/L (ref 22–32)
CREATININE: 0.99 mg/dL (ref 0.61–1.24)
Chloride: 102 mmol/L (ref 101–111)
GFR calc Af Amer: >60 mL/min (ref >60)
GFR calc non Af Amer: >60 mL/min (ref >60)
GLUCOSE: 142 mg/dL — AB (ref 65–99)
Potassium: 4.1 mmol/L (ref 3.5–5.1)
Sodium: 132 mmol/L — ABNORMAL LOW (ref 135–145)

## 2015-03-10 NOTE — Progress Notes (Signed)
   Subjective: 2 Days Post-Op Procedure(s) (LRB): RIGHT TOTAL HIP ARTHROPLASTY ANTERIOR APPROACH (Right) Patient reports pain as mild.   Patient seen in rounds for Dr. Lequita Halt. He is better today but needs mor therapy Patient is well, but has had some minor complaints of pain in the thigh, requiring pain medications We will resume therapy today.  Plan is to go Skilled nursing facility after hospital stay.  Objective: Vital signs in last 24 hours: Temp:  [97.8 F (36.6 C)-99.3 F (37.4 C)] 98.5 F (36.9 C) (02/01 0449) Pulse Rate:  [69-71] 70 (02/01 0449) Resp:  [15-16] 16 (02/01 0449) BP: (97-119)/(58-73) 118/73 mmHg (02/01 0449) SpO2:  [98 %-100 %] 99 % (02/01 0449)  Intake/Output from previous day:  Intake/Output Summary (Last 24 hours) at 03/10/15 0708 Last data filed at 03/10/15 0110  Gross per 24 hour  Intake 1300.84 ml  Output   1275 ml  Net  25.84 ml    Labs:  Recent Labs  03/08/15 2230 03/09/15 0500 03/10/15 0442  HGB 12.6* 12.1* 10.9*    Recent Labs  03/09/15 0500 03/10/15 0442  WBC 13.9* 21.8*  RBC 3.66* 3.38*  HCT 34.6* 32.0*  PLT 191 195    Recent Labs  03/09/15 0500 03/10/15 0442  NA 133* 132*  K 4.4 4.1  CL 103 102  CO2 22 23  BUN 19 17  CREATININE 1.14 0.99  GLUCOSE 173* 142*  CALCIUM 8.2* 8.0*   No results for input(s): LABPT, INR in the last 72 hours.  EXAM General - Patient is Alert, Appropriate and Oriented Extremity - Neurovascular intact Sensation intact distally Dressing - dressing C/D/I Motor Function - intact, moving foot and toes well on exam.   Past Medical History  Diagnosis Date  . Hypertension   . Dyslipidemia   . Asthma   . Allergic rhinitis   . Presence of permanent cardiac pacemaker   . Sleep apnea     pt states not currently using CPAP machine   . History of bronchitis   . GERD (gastroesophageal reflux disease)   . Arthritis   . Dysrhythmia     history of paroxysmal A Fib and typical A Flutter   .  History of sick sinus syndrome   . History of syncope     Assessment/Plan: 2 Days Post-Op Procedure(s) (LRB): RIGHT TOTAL HIP ARTHROPLASTY ANTERIOR APPROACH (Right) Principal Problem:   OA (osteoarthritis) of hip  Estimated body mass index is 26.35 kg/(m^2) as calculated from the following:   Height as of this encounter: 5' 9.5" (1.765 m).   Weight as of this encounter: 82.101 kg (181 lb). Advance diet Up with therapy Plan for discharge tomorrow Discharge to SNF  DVT Prophylaxis - Eliquis Weight Bearing As Tolerated right Leg   Avel Peace, PA-C Orthopaedic Surgery 03/10/2015, 7:08 AM

## 2015-03-10 NOTE — Progress Notes (Signed)
Physical Therapy Treatment Patient Details Name: MARQUIST BINSTOCK MRN: [id number] DOB: 02/21/1934 Today's Date: Mar 27, 2015    History of Present Illness Pt is a 81 year old male s/p R direct anterior THA with hx of syncope, permanent cardiac pacemaker.    PT Comments    Steady progress with mobility.  Follow Up Recommendations  SNF     Equipment Recommendations  None recommended by PT    Recommendations for Other Services       Precautions / Restrictions Precautions Precautions: Fall Restrictions Weight Bearing Restrictions: No Other Position/Activity Restrictions: WBAT    Mobility  Bed Mobility               General bed mobility comments: Pt OOB and declines back to bed  Transfers Overall transfer level: Needs assistance Equipment used: Rolling walker (2 wheeled) Transfers: Sit to/from Stand Sit to Stand: Min assist         General transfer comment: verbal cues for UE and LE positioning  Ambulation/Gait Ambulation/Gait assistance: Min assist;Min guard Ambulation Distance (Feet): 180 Feet Assistive device: Rolling walker (2 wheeled) Gait Pattern/deviations: Step-to pattern;Step-through pattern;Decreased step length - right;Decreased step length - left;Shuffle;Trunk flexed Gait velocity: decr   General Gait Details: cues for sequence, posture and position from [employer]            Wheelchair Mobility    Modified Rankin (Stroke Patients Only)       Balance                                    Cognition Arousal/Alertness: Awake/alert Behavior During Therapy: WFL for tasks assessed/performed Overall Cognitive Status: Within Functional Limits for tasks assessed                      Exercises Total Joint Exercises Ankle Circles/Pumps: AROM;Both;20 reps;Supine Quad Sets: AROM;Both;10 reps;Supine Heel Slides: AAROM;Right;20 reps;Supine Hip ABduction/ADduction: AAROM;Right;15 reps;Supine    General Comments         Pertinent Vitals/Pain Pain Assessment: 0-10 Pain Score: 3  Pain Location: R hip Pain Descriptors / Indicators: Aching;Sore Pain Intervention(s): Limited activity within patient's tolerance;Monitored during session;Premedicated before session;Ice applied    Home Living                      Prior Function            PT Goals (current goals can now be found in the care plan section) Acute Rehab PT Goals Patient Stated Goal: get back to playing tennis PT Goal Formulation: With patient Time For Goal Achievement: 03/13/15 Potential to Achieve Goals: Good Progress towards PT goals: Progressing toward goals    Frequency  7X/week    PT Plan Current plan remains appropriate    Co-evaluation             End of Session Equipment Utilized During Treatment: Gait belt Activity Tolerance: Patient tolerated treatment well Patient left: in chair;with call bell/phone within reach;with family/visitor present     Time: 7829-5621 PT Time Calculation (min) (ACUTE ONLY): 27 min  Charges:  $Gait Training: 8-22 mins $Therapeutic Exercise: 8-22 mins                    G Codes:      Davine Sweney Mar 27, 2015, 12:52 PM

## 2015-03-10 NOTE — Progress Notes (Signed)
Physical Therapy Treatment Patient Details Name: Isaiah Murphy MRN: [id number] DOB: 06-27-1934 Today's Date: 03/10/2015    History of Present Illness Pt is a 81 year old male s/p R direct anterior THA with hx of syncope, permanent cardiac pacemaker.    PT Comments    Pt very cooperative and progressing steadily with mobility  Follow Up Recommendations  SNF     Equipment Recommendations  None recommended by PT    Recommendations for Other Services       Precautions / Restrictions Precautions Precautions: Fall Restrictions Weight Bearing Restrictions: No Other Position/Activity Restrictions: WBAT    Mobility  Bed Mobility Overal bed mobility: Needs Assistance Bed Mobility: Supine to Sit     Supine to sit: Min assist     General bed mobility comments: Cues for sequence and use of L LE to self assist  Transfers Overall transfer level: Needs assistance Equipment used: Rolling walker (2 wheeled) Transfers: Sit to/from Stand Sit to Stand: Min guard         General transfer comment: cues for LE management and use of UEs to self assist  Ambulation/Gait Ambulation/Gait assistance: Min assist;Min guard Ambulation Distance (Feet): 100 Feet (twice) Assistive device: Rolling walker (2 wheeled) Gait Pattern/deviations: Step-to pattern;Step-through pattern;Decreased step length - right;Decreased step length - left;Shuffle;Trunk flexed Gait velocity: decr   General Gait Details: cues for sequence, posture, ER on R and position from [employer] Mobility    Modified Rankin (Stroke Patients Only)       Balance                                    Cognition Arousal/Alertness: Awake/alert Behavior During Therapy: WFL for tasks assessed/performed Overall Cognitive Status: Within Functional Limits for tasks assessed                      Exercises Total Joint Exercises Ankle Circles/Pumps: AROM;Both;20  reps;Supine Quad Sets: AROM;Both;10 reps;Supine Heel Slides: AAROM;Right;20 reps;Supine Hip ABduction/ADduction: AAROM;Right;15 reps;Supine    General Comments        Pertinent Vitals/Pain Pain Assessment: 0-10 Pain Score: 3  Pain Location: R hip Pain Descriptors / Indicators: Aching;Sore Pain Intervention(s): Monitored during session;Limited activity within patient's tolerance;Premedicated before session;Ice applied    Home Living                      Prior Function            PT Goals (current goals can now be found in the care plan section) Acute Rehab PT Goals Patient Stated Goal: get back to playing tennis PT Goal Formulation: With patient Time For Goal Achievement: 03/13/15 Potential to Achieve Goals: Good Progress towards PT goals: Progressing toward goals    Frequency  7X/week    PT Plan Current plan remains appropriate    Co-evaluation             End of Session Equipment Utilized During Treatment: Gait belt Activity Tolerance: Patient tolerated treatment well Patient left: in chair;with call bell/phone within reach     Time: 4401-0272 PT Time Calculation (min) (ACUTE ONLY): 26 min  Charges:  $Gait Training: 23-37 mins                    G Codes:  Roselynn Whitacre 03/10/2015, 5:08 PM

## 2015-03-11 LAB — CBC
HEMATOCRIT: 35.3 % — AB (ref 39.0–52.0)
Hemoglobin: 12.1 g/dL — ABNORMAL LOW (ref 13.0–17.0)
MCH: 32.2 pg (ref 26.0–34.0)
MCHC: 34.3 g/dL (ref 30.0–36.0)
MCV: 93.9 fL (ref 78.0–100.0)
PLATELETS: 202 10*3/uL (ref 150–400)
RBC: 3.76 MIL/uL — AB (ref 4.22–5.81)
RDW: 12.7 % (ref 11.5–15.5)
WBC: 16.1 10*3/uL — AB (ref 4.0–10.5)

## 2015-03-11 MED ORDER — BISACODYL 10 MG RE SUPP: 10.0000 mg | Freq: Every day | RECTAL | Status: DC | PRN

## 2015-03-11 MED ORDER — FLEET ENEMA 7-19 GM/118ML RE ENEM: 1.0000 | ENEMA | Freq: Once | RECTAL | Status: DC | PRN

## 2015-03-11 MED ORDER — METHOCARBAMOL 500 MG PO TABS: 500.0000 mg | ORAL_TABLET | Freq: Four times a day (QID) | ORAL | Status: DC | PRN

## 2015-03-11 MED ORDER — DIPHENHYDRAMINE HCL 12.5 MG/5ML PO ELIX: 12.5000 mg | ORAL_SOLUTION | ORAL | Status: DC | PRN

## 2015-03-11 MED ORDER — TRAMADOL HCL 50 MG PO TABS: 50.0000 mg | ORAL_TABLET | Freq: Four times a day (QID) | ORAL | Status: DC | PRN

## 2015-03-11 MED ORDER — OXYCODONE HCL 5 MG PO TABS: 5.0000 mg | ORAL_TABLET | ORAL | Status: DC | PRN

## 2015-03-11 MED ORDER — METOCLOPRAMIDE HCL 5 MG PO TABS: 5.0000 mg | ORAL_TABLET | Freq: Three times a day (TID) | ORAL | Status: DC | PRN

## 2015-03-11 MED ORDER — ONDANSETRON HCL 4 MG PO TABS: 4.0000 mg | ORAL_TABLET | Freq: Four times a day (QID) | ORAL | Status: DC | PRN

## 2015-03-11 MED ORDER — POLYETHYLENE GLYCOL 3350 17 G PO PACK: 17.0000 g | PACK | Freq: Every day | ORAL | Status: DC | PRN

## 2015-03-11 MED ORDER — DOCUSATE SODIUM 100 MG PO CAPS: 100.0000 mg | ORAL_CAPSULE | Freq: Two times a day (BID) | ORAL | Status: DC

## 2015-03-11 MED ORDER — ACETAMINOPHEN 325 MG PO TABS: 650.0000 mg | ORAL_TABLET | Freq: Four times a day (QID) | ORAL | Status: DC | PRN

## 2015-03-11 NOTE — Progress Notes (Signed)
   Subjective: 3 Days Post-Op Procedure(s) (LRB): RIGHT TOTAL HIP ARTHROPLASTY ANTERIOR APPROACH (Right) Patient reports pain as mild.   Patient seen in rounds with Dr. Lequita Halt. Patient is well, and has had no acute complaints or problems Patient is ready to go to the SNF  Objective: Vital signs in last 24 hours: Temp:  [98.4 F (36.9 C)-99.8 F (37.7 C)] 98.4 F (36.9 C) (02/02 0636) Pulse Rate:  [68-80] 80 (02/02 0844) Resp:  [16-18] 16 (02/02 0844) BP: (121-153)/(74-80) 121/74 mmHg (02/02 0844) SpO2:  [98 %-100 %] 100 % (02/02 0844)  Intake/Output from previous day:  Intake/Output Summary (Last 24 hours) at 03/11/15 0951 Last data filed at 03/11/15 0500  Gross per 24 hour  Intake    960 ml  Output    450 ml  Net    510 ml    Labs:  Recent Labs  03/08/15 2230 03/09/15 0500 03/10/15 0442 03/11/15 0419  HGB 12.6* 12.1* 10.9* 12.1*    Recent Labs  03/10/15 0442 03/11/15 0419  WBC 21.8* 16.1*  RBC 3.38* 3.76*  HCT 32.0* 35.3*  PLT 195 202    Recent Labs  03/09/15 0500 03/10/15 0442  NA 133* 132*  K 4.4 4.1  CL 103 102  CO2 22 23  BUN 19 17  CREATININE 1.14 0.99  GLUCOSE 173* 142*  CALCIUM 8.2* 8.0*   No results for input(s): LABPT, INR in the last 72 hours.  EXAM: General - Patient is Alert, Appropriate and Oriented Extremity - Neurovascular intact Sensation intact distally Dorsiflexion/Plantar flexion intact Incision - clean, dry, no drainage Motor Function - intact, moving foot and toes well on exam.   Assessment/Plan: 3 Days Post-Op Procedure(s) (LRB): RIGHT TOTAL HIP ARTHROPLASTY ANTERIOR APPROACH (Right) Procedure(s) (LRB): RIGHT TOTAL HIP ARTHROPLASTY ANTERIOR APPROACH (Right) Past Medical History  Diagnosis Date  . Hypertension   . Dyslipidemia   . Asthma   . Allergic rhinitis   . Presence of permanent cardiac pacemaker   . Sleep apnea     pt states not currently using CPAP machine   . History of bronchitis   . GERD  (gastroesophageal reflux disease)   . Arthritis   . Dysrhythmia     history of paroxysmal A Fib and typical A Flutter   . History of sick sinus syndrome   . History of syncope    Principal Problem:   OA (osteoarthritis) of hip  Estimated body mass index is 26.35 kg/(m^2) as calculated from the following:   Height as of this encounter: 5' 9.5" (1.765 m).   Weight as of this encounter: 82.101 kg (181 lb). Up with therapy Discharge to SNF Diet - Cardiac diet Follow up - in 2 weeks Activity - WBAT Disposition - Skilled nursing facility Condition Upon Discharge - Good D/C Meds - See DC Summary DVT Prophylaxis - Eliquis  Avel Peace, PA-C Orthopaedic Surgery 03/11/2015, 9:51 AM

## 2015-03-11 NOTE — Discharge Instructions (Signed)
° °Dr. Frank Aluisio °Total Joint Specialist °[facility] °[address] °[place], [place] [address] °[phone] ° °ANTERIOR APPROACH TOTAL HIP REPLACEMENT POSTOPERATIVE DIRECTIONS ° ° °Hip Rehabilitation, Guidelines Following Surgery  °The results of a hip operation are greatly improved after range of motion and muscle strengthening exercises. Follow all safety measures which are given to protect your hip. If any of these exercises cause increased pain or swelling in your joint, decrease the amount until you are comfortable again. Then slowly increase the exercises. Call your caregiver if you have problems or questions.  ° °HOME CARE INSTRUCTIONS  °Remove items at home which could result in a fall. This includes throw rugs or furniture in walking pathways.  °· ICE to the affected hip every three hours for 30 minutes at a time and then as needed for pain and swelling.  Continue to use ice on the hip for pain and swelling from surgery. You may notice swelling that will progress down to the foot and ankle.  This is normal after surgery.  Elevate the leg when you are not up walking on it.   °· Continue to use the breathing machine which will help keep your temperature down.  It is common for your temperature to cycle up and down following surgery, especially at night when you are not up moving around and exerting yourself.  The breathing machine keeps your lungs expanded and your temperature down. ° ° °DIET °You may resume your previous home diet once your are discharged from the hospital. ° °DRESSING / WOUND CARE / SHOWERING °You may shower 3 days after surgery, but keep the wounds dry during showering.  You may use an occlusive plastic wrap (Press'n Seal for example), NO SOAKING/SUBMERGING IN THE BATHTUB.  If the bandage gets wet, change with a clean dry gauze.  If the incision gets wet, pat the wound dry with a clean towel. °You may start showering once you are discharged home but do not  submerge the incision under water. Just pat the incision dry and apply a dry gauze dressing on daily. °Change the surgical dressing daily and reapply a dry dressing each time. ° °ACTIVITY °Walk with your walker as instructed. °Use walker as long as suggested by your caregivers. °Avoid periods of inactivity such as sitting longer than an hour when not asleep. This helps prevent blood clots.  °You may resume a sexual relationship in one month or when given the OK by your doctor.  °You may return to work once you are cleared by your doctor.  °Do not drive a car for 6 weeks or until released by you surgeon.  °Do not drive while taking narcotics. ° °WEIGHT BEARING °Weight bearing as tolerated with assist device (walker, cane, etc) as directed, use it as long as suggested by your surgeon or therapist, typically at least 4-6 weeks. ° °POSTOPERATIVE CONSTIPATION PROTOCOL °Constipation - defined medically as fewer than three stools per week and severe constipation as less than one stool per week. ° °One of the most common issues patients have following surgery is constipation.  Even if you have a regular bowel pattern at home, your normal regimen is likely to be disrupted due to multiple reasons following surgery.  Combination of anesthesia, postoperative narcotics, change in appetite and fluid intake all can affect your bowels.  In order to avoid complications following surgery, here are some recommendations in order to help you during your recovery period. ° °Colace (docusate) - Pick up an over-the-counter   form of Colace or another stool softener and take twice a day as long as you are requiring postoperative pain medications.  Take with a full glass of water daily.  If you experience loose stools or diarrhea, hold the colace until you stool forms back up.  If your symptoms do not get better within 1 week or if they get worse, check with your doctor. ° °Dulcolax (bisacodyl) - Pick up over-the-counter and take as directed  by the product packaging as needed to assist with the movement of your bowels.  Take with a full glass of water.  Use this product as needed if not relieved by Colace only.  ° °MiraLax (polyethylene glycol) - Pick up over-the-counter to have on hand.  MiraLax is a solution that will increase the amount of water in your bowels to assist with bowel movements.  Take as directed and can mix with a glass of water, juice, soda, coffee, or tea.  Take if you go more than two days without a movement. °Do not use MiraLax more than once per day. Call your doctor if you are still constipated or irregular after using this medication for 7 days in a row. ° °If you continue to have problems with postoperative constipation, please contact the office for further assistance and recommendations.  If you experience "the worst abdominal pain ever" or develop nausea or vomiting, please contact the office immediatly for further recommendations for treatment. ° °ITCHING ° If you experience itching with your medications, try taking only a single pain pill, or even half a pain pill at a time.  You can also use Benadryl over the counter for itching or also to help with sleep.  ° °TED HOSE STOCKINGS °Wear the elastic stockings on both legs for three weeks following surgery during the day but you may remove then at night for sleeping. ° °MEDICATIONS °See your medication summary on the “After Visit Summary” that the nursing staff will review with you prior to discharge.  You may have some home medications which will be placed on hold until you complete the course of blood thinner medication.  It is important for you to complete the blood thinner medication as prescribed by your surgeon.  Continue your approved medications as instructed at time of discharge. ° °PRECAUTIONS °If you experience chest pain or shortness of breath - call 911 immediately for transfer to the hospital emergency department.  °If you develop a fever greater that 101 F,  purulent drainage from wound, increased redness or drainage from wound, foul odor from the wound/dressing, or calf pain - CONTACT YOUR SURGEON.   °                                                °FOLLOW-UP APPOINTMENTS °Make sure you keep all of your appointments after your operation with your surgeon and caregivers. You should call the office at the above phone number and make an appointment for approximately two weeks after the date of your surgery or on the date instructed by your surgeon outlined in the "After Visit Summary". ° °RANGE OF MOTION AND STRENGTHENING EXERCISES  °These exercises are designed to help you keep full movement of your hip joint. Follow your caregiver's or physical therapist's instructions. Perform all exercises about fifteen times, three times per day or as directed. Exercise both hips, even if you   have had only one joint replacement. These exercises can be done on a training (exercise) mat, on the floor, on a table or on a bed. Use whatever works the best and is most comfortable for you. Use music or television while you are exercising so that the exercises are a pleasant break in your day. This will make your life better with the exercises acting as a break in routine you can look forward to.  Lying on your back, slowly slide your foot toward your buttocks, raising your knee up off the floor. Then slowly slide your foot back down until your leg is straight again.  Lying on your back spread your legs as far apart as you can without causing discomfort.  Lying on your side, raise your upper leg and foot straight up from the floor as far as is comfortable. Slowly lower the leg and repeat.  Lying on your back, tighten up the muscle in the front of your thigh (quadriceps muscles). You can do this by keeping your leg straight and trying to raise your heel off the floor. This helps strengthen the largest muscle supporting your knee.  Lying on your back, tighten up the muscles of your  buttocks both with the legs straight and with the knee bent at a comfortable angle while keeping your heel on the floor.   IF YOU ARE TRANSFERRED TO A SKILLED REHAB FACILITY If the patient is transferred to a skilled rehab facility following release from the hospital, a list of the current medications will be sent to the facility for the patient to continue.  When discharged from the skilled rehab facility, please have the facility set up the patient's Home Health Physical Therapy prior to being released. Also, the skilled facility will be responsible for providing the patient with their medications at time of release from the facility to include their pain medication, the muscle relaxants, and their blood thinner medication. If the patient is still at the rehab facility at time of the two week follow up appointment, the skilled rehab facility will also need to assist the patient in arranging follow up appointment in our office and any transportation needs.  MAKE SURE YOU:  Understand these instructions.  Get help right away if you are not doing well or get worse.    Pick up stool softner and laxative for home use following surgery while on pain medications. Do not submerge incision under water. Please use good hand washing techniques while changing dressing each day. May shower starting three days after surgery. Please use a clean towel to pat the incision dry following showers. Continue to use ice for pain and swelling after surgery. Do not use any lotions or creams on the incision until instructed by your surgeon.  Resume the full dose home dosing of Eliquis at time of transfer to SNF.

## 2015-03-11 NOTE — Clinical Social Work Placement (Signed)
   CLINICAL SOCIAL WORK PLACEMENT  NOTE  Date:  03/11/2015  Patient Details  Name: LENZIE SANDLER MRN: [id number] Date of Birth: 27-Oct-1934  Clinical Social Work is seeking post-discharge placement for this patient at the Skilled  Nursing Facility level of care (*CSW will initial, date and re-position this form in  chart as items are completed):  Yes   Patient/family provided with [facility] Clinical Social Work Department's list of facilities offering this level of care within the geographic area requested by the patient (or if unable, by the patient's family).  Yes   Patient/family informed of their freedom to choose among providers that offer the needed level of care, that participate in Medicare, Medicaid or managed care program needed by the patient, have an available bed and are willing to accept the patient.  No   Patient/family informed of [facility]'s ownership interest in [facility] and [facility], as well as of the fact that they are under no obligation to receive care at these facilities.  PASRR submitted to EDS on 03/10/15     PASRR number received on 03/10/15     Existing PASRR number confirmed on       FL2 transmitted to all facilities in geographic area requested by pt/family on 03/11/15     FL2 transmitted to all facilities within larger geographic area on       Patient informed that his/her managed care company has contracts with or will negotiate with certain facilities, including the following:        Yes   Patient/family informed of bed offers received.  Patient chooses bed at [facility]     Physician recommends and patient chooses bed at      Patient to be transferred to [facility] on 03/11/15.  Patient to be transferred to facility by CAR     Patient family notified on 03/11/15 of transfer.  Name of family member notified:  SPOUSE     PHYSICIAN       Additional Comment: Pt / family are in agreement with d/c to [facility] today. PT  approved transport by car. D/C Summary sent to SNF for review prior to d/c. Scripts included in d/c packet. D/C packet provided to Spouse prior to d/c. # for report provided to nsg.   _______________________________________________ Royetta Asal, LCSW 03/11/2015, 1:12 PM

## 2015-03-11 NOTE — Progress Notes (Signed)
Pt d/c to [facility]. Report given to nurse at facility prior to d/c. Patient to be transport by car via daughter. Patient's pain assessed and medicated prior to transport. Pt in no distress and ready for d/c.

## 2015-03-11 NOTE — Progress Notes (Signed)
Physical Therapy Treatment Patient Details Name: JLON BETKER MRN: [id number] DOB: 1934/12/17 Today's Date: 03/11/2015    History of Present Illness Pt is a 81 year old male s/p R direct anterior THA with hx of syncope, permanent cardiac pacemaker.    PT Comments    Pt dressed and eager to D/C to SNF.  Assisted with amb a greater distance in hallway.  Required increased time but progressing.    Follow Up Recommendations  SNF     Equipment Recommendations       Recommendations for Other Services       Precautions / Restrictions Precautions Precautions: Fall Restrictions Weight Bearing Restrictions: No    Mobility  Bed Mobility               General bed mobility comments: Pt OOB in recliner  Transfers Overall transfer level: Needs assistance Equipment used: Rolling walker (2 wheeled) Transfers: Sit to/from Stand Sit to Stand: Min guard         General transfer comment: cues for LE management and use of UEs to self assist  Ambulation/Gait Ambulation/Gait assistance: Min guard Ambulation Distance (Feet): 105 Feet Assistive device: Rolling walker (2 wheeled) Gait Pattern/deviations: Step-to pattern;Decreased stance time - right;Decreased step length - right;Decreased step length - left Gait velocity: decr   General Gait Details: cues for sequence, posture, ER on R and position from [employer] Mobility    Modified Rankin (Stroke Patients Only)       Balance                                    Cognition Arousal/Alertness: Awake/alert Behavior During Therapy: WFL for tasks assessed/performed Overall Cognitive Status: Within Functional Limits for tasks assessed                      Exercises      General Comments        Pertinent Vitals/Pain Pain Assessment: 0-10 Pain Score: 2  Pain Location: R hip Pain Descriptors / Indicators: Aching;Sore Pain Intervention(s): Monitored during  session;Repositioned;Ice applied    Home Living                      Prior Function            PT Goals (current goals can now be found in the care plan section) Progress towards PT goals: Progressing toward goals    Frequency  7X/week    PT Plan Current plan remains appropriate    Co-evaluation             End of Session Equipment Utilized During Treatment: Gait belt Activity Tolerance: Patient tolerated treatment well Patient left: in chair;with call bell/phone within reach     Time: 1040-1100 PT Time Calculation (min) (ACUTE ONLY): 20 min  Charges:  $Gait Training: 8-22 mins                    G Codes:      Felecia Shelling  PTA [facility]  Acute  Rehab Pager      [phone]

## 2015-03-11 NOTE — Progress Notes (Signed)
   Subjective: 3 Days Post-Op Procedure(s) (LRB): RIGHT TOTAL HIP ARTHROPLASTY ANTERIOR APPROACH (Right) Patient reports pain as mild.   Patient seen in rounds with Dr. Lequita Halt.  Wife and daughter in room. Patient is well, and has had no acute complaints or problems Patient is ready to go to the SNF, [facility], today.  Objective: Vital signs in last 24 hours: Temp:  [98.4 F (36.9 C)-99.8 F (37.7 C)] 98.4 F (36.9 C) (02/02 0636) Pulse Rate:  [68-74] 74 (02/02 0459) Resp:  [16-18] 16 (02/02 0459) BP: (124-153)/(76-80) 124/80 mmHg (02/02 0459) SpO2:  [98 %-100 %] 100 % (02/02 0459)  Intake/Output from previous day:  Intake/Output Summary (Last 24 hours) at 03/11/15 0817 Last data filed at 03/11/15 0500  Gross per 24 hour  Intake   1080 ml  Output    450 ml  Net    630 ml    Labs:  Recent Labs  03/08/15 2230 03/09/15 0500 03/10/15 0442 03/11/15 0419  HGB 12.6* 12.1* 10.9* 12.1*    Recent Labs  03/10/15 0442 03/11/15 0419  WBC 21.8* 16.1*  RBC 3.38* 3.76*  HCT 32.0* 35.3*  PLT 195 202    Recent Labs  03/09/15 0500 03/10/15 0442  NA 133* 132*  K 4.4 4.1  CL 103 102  CO2 22 23  BUN 19 17  CREATININE 1.14 0.99  GLUCOSE 173* 142*  CALCIUM 8.2* 8.0*   No results for input(s): LABPT, INR in the last 72 hours.  EXAM: General - Patient is Alert, Appropriate and Oriented Extremity - Neurovascular intact Sensation intact distally Dorsiflexion/Plantar flexion intact Incision - clean, dry, no drainage Motor Function - intact, moving foot and toes well on exam.   Assessment/Plan: 3 Days Post-Op Procedure(s) (LRB): RIGHT TOTAL HIP ARTHROPLASTY ANTERIOR APPROACH (Right) Procedure(s) (LRB): RIGHT TOTAL HIP ARTHROPLASTY ANTERIOR APPROACH (Right) Past Medical History  Diagnosis Date  . Hypertension   . Dyslipidemia   . Asthma   . Allergic rhinitis   . Presence of permanent cardiac pacemaker   . Sleep apnea     pt states not currently using CPAP  machine   . History of bronchitis   . GERD (gastroesophageal reflux disease)   . Arthritis   . Dysrhythmia     history of paroxysmal A Fib and typical A Flutter   . History of sick sinus syndrome   . History of syncope    Principal Problem:   OA (osteoarthritis) of hip  Estimated body mass index is 26.35 kg/(m^2) as calculated from the following:   Height as of this encounter: 5' 9.5" (1.765 m).   Weight as of this encounter: 82.101 kg (181 lb). Up with therapy Discharge to SNF Diet - Cardiac diet Follow up - in 2 weeks on Tuesday 2.14.2017 Activity - WBAT Disposition - Skilled nursing facility Condition Upon Discharge - Good D/C Meds - See DC Summary DVT Prophylaxis - Eliquis  Avel Peace, PA-C Orthopaedic Surgery 03/11/2015, 8:17 AM

## 2015-03-12 ENCOUNTER — Non-Acute Institutional Stay (SKILLED_NURSING_FACILITY): Payer: Federal, State, Local not specified - PPO | Admitting: Internal Medicine

## 2015-03-12 ENCOUNTER — Encounter: Payer: Self-pay | Admitting: Internal Medicine

## 2015-03-12 DIAGNOSIS — R2681 Unsteadiness on feet: Secondary | ICD-10-CM | POA: Diagnosis not present

## 2015-03-12 DIAGNOSIS — D72829 Elevated white blood cell count, unspecified: Secondary | ICD-10-CM | POA: Diagnosis not present

## 2015-03-12 DIAGNOSIS — I10 Essential (primary) hypertension: Secondary | ICD-10-CM

## 2015-03-12 DIAGNOSIS — J4541 Moderate persistent asthma with (acute) exacerbation: Secondary | ICD-10-CM

## 2015-03-12 DIAGNOSIS — K5900 Constipation, unspecified: Secondary | ICD-10-CM | POA: Diagnosis not present

## 2015-03-12 DIAGNOSIS — D62 Acute posthemorrhagic anemia: Secondary | ICD-10-CM | POA: Diagnosis not present

## 2015-03-12 DIAGNOSIS — E785 Hyperlipidemia, unspecified: Secondary | ICD-10-CM

## 2015-03-12 DIAGNOSIS — E871 Hypo-osmolality and hyponatremia: Secondary | ICD-10-CM | POA: Diagnosis not present

## 2015-03-12 DIAGNOSIS — M1611 Unilateral primary osteoarthritis, right hip: Secondary | ICD-10-CM

## 2015-03-12 NOTE — Progress Notes (Signed)
Patient ID: Isaiah Murphy, male   DOB: 28-Apr-1934, 81 y.o.   MRN: [id number]    LOCATION: [facility]  PCP: No PCP Per Patient   Code Status: Full Code  Goals of care: Advanced Directive information Advanced Directives 03/08/2015  Does patient have an advance directive? No  Would patient like information on creating an advanced directive? -  Pre-existing out of facility DNR order (yellow form or pink MOST form) -     Extended Emergency Contact Information Primary Emergency Contact: Buda,Pearl Address: [address]          [place], [place] [address] [place] Home Phone: [phone] Mobile Phone: [phone] Relation: Spouse Secondary Emergency Contact: Agustin,Renee Address: [address]          [place], [place] [address] [place] Home Phone: [phone] Work Phone: [phone] Mobile Phone: [phone] Relation: Daughter   Allergies  Allergen Reactions  . Penicillins     Has patient had a PCN reaction causing immediate rash, facial/tongue/throat swelling, SOB or lightheadedness with hypotension: No Has patient had a PCN reaction causing severe rash involving mucus membranes or skin necrosis: No Has patient had a PCN reaction that required hospitalization No Has patient had a PCN reaction occurring within the last 10 years: No If all of the above answers are "NO", then may proceed with Cephalosporin use. Rash at the injection site    Chief Complaint  Patient presents with  . New Admit To SNF    New Admission     HPI:  Patient is a 81 y.o. male seen today for short term rehabilitation post hospital admission from 03/08/15-03/11/15 with right hip OA. He underwent right total hip arthroplasty. He is seen in his room today. His pain is under control. He has been constipated. No other concerns this am.   Review of Systems:  Constitutional: Negative for fever, chills, malaise HENT: Negative for headache, congestion, nasal discharge,  difficulty swallowing.   Eyes: Negative for blurred vision, double vision and discharge.  Respiratory: Negative for cough, shortness of breath and wheezing.   Cardiovascular: Negative for chest pain, palpitations, leg swelling.  Gastrointestinal: Negative for heartburn, nausea, vomiting, abdominal pain. Genitourinary: Negative for dysuria Musculoskeletal: Negative for back pain, falls in the facility  Skin: Negative for itching, rash.  Neurological: Negative for dizziness, tingling. Psychiatric/Behavioral: Negative for depression   Past Medical History  Diagnosis Date  . Hypertension   . Dyslipidemia   . Asthma   . Allergic rhinitis   . Presence of permanent cardiac pacemaker   . Sleep apnea     pt states not currently using CPAP machine   . History of bronchitis   . GERD (gastroesophageal reflux disease)   . Arthritis   . Dysrhythmia     history of paroxysmal A Fib and typical A Flutter   . History of sick sinus syndrome   . History of syncope    Past Surgical History  Procedure Laterality Date  . Hemorrhoid surgery    . Tee without cardioversion N/A 04/28/2013    Procedure: TRANSESOPHAGEAL ECHOCARDIOGRAM (TEE);  Surgeon: Lars Masson, MD;  Location: [facility] ENDOSCOPY;  Service: Cardiovascular;  Laterality: N/A;  . Cardioversion N/A 04/28/2013    Procedure: CARDIOVERSION;  Surgeon: Lars Masson, MD;  Location: [facility] ENDOSCOPY;  Service: Cardiovascular;  Laterality: N/A;  . Left shoulder surgery       secondary to torn ligament / subluxation  . Total hip arthroplasty Right 03/08/2015  Procedure: RIGHT TOTAL HIP ARTHROPLASTY ANTERIOR APPROACH;  Surgeon: Ollen Gross, MD;  Location: [facility] ORS;  Service: Orthopedics;  Laterality: Right;   Social History:   reports that he has never smoked. He has never used smokeless tobacco. He reports that he drinks alcohol. He reports that he does not use illicit drugs.  Family History  Problem Relation Age of Onset  . Hypertension Father    . Allergic rhinitis Neg Hx   . Asthma Neg Hx   . Eczema Neg Hx   . Immunodeficiency Neg Hx   . Urticaria Neg Hx     Medications:   Medication List       This list is accurate as of: 03/12/15 10:35 AM.  Always use your most recent med list.               acetaminophen 325 MG tablet  Commonly known as:  TYLENOL  Take 2 tablets (650 mg total) by mouth every 6 (six) hours as needed for mild pain (or Fever >/= 101).     albuterol 108 (90 Base) MCG/ACT inhaler  Commonly known as:  PROAIR HFA  Inhale 2 puffs into the lungs every 4 (four) hours as needed for wheezing or shortness of breath (cough).     amLODipine 10 MG tablet  Commonly known as:  NORVASC  Take 10 mg by mouth every morning.     apixaban 5 MG Tabs tablet  Commonly known as:  ELIQUIS  Take 1 tablet (5 mg total) by mouth 2 (two) times daily.     aspirin 81 MG EC tablet  Take 1 tablet (81 mg total) by mouth daily.     atorvastatin 20 MG tablet  Commonly known as:  LIPITOR  Take 20 mg by mouth at bedtime.     bisacodyl 10 MG suppository  Commonly known as:  DULCOLAX  Place 1 suppository (10 mg total) rectally daily as needed for moderate constipation.     chlorpheniramine-HYDROcodone 10-8 MG/5ML Suer  Commonly known as:  TUSSIONEX PENNKINETIC ER  Take 5 mLs by mouth every 12 (twelve) hours as needed for cough.     ciclesonide 160 MCG/ACT inhaler  Commonly known as:  ALVESCO  Use one puff twice daily to prevent cough or wheeze.  Increase to 2 puffs twice daily with asthma flare. Rinse, gargle and spit after use     diphenhydrAMINE 12.5 MG/5ML elixir  Commonly known as:  BENADRYL  Take 5-10 mLs (12.5-25 mg total) by mouth every 4 (four) hours as needed for itching.     docusate sodium 100 MG capsule  Commonly known as:  COLACE  Take 1 capsule (100 mg total) by mouth 2 (two) times daily.     esomeprazole 40 MG capsule  Commonly known as:  NEXIUM  Take 40 mg by mouth daily. Reported on 02/04/2015      hydrochlorothiazide 25 MG tablet  Commonly known as:  HYDRODIURIL  Take 25 mg by mouth at bedtime.     lisinopril 40 MG tablet  Commonly known as:  PRINIVIL,ZESTRIL  Take 40 mg by mouth 2 (two) times daily.     methocarbamol 500 MG tablet  Commonly known as:  ROBAXIN  Take 1 tablet (500 mg total) by mouth every 6 (six) hours as needed for muscle spasms.     metoCLOPramide 5 MG tablet  Commonly known as:  REGLAN  Take 1 tablet (5 mg total) by mouth every 8 (eight) hours as needed for nausea (if ondansetron (  ZOFRAN) ineffective.).     mometasone 50 MCG/ACT nasal spray  Commonly known as:  NASONEX  Place 2 sprays into the nose daily.     montelukast 10 MG tablet  Commonly known as:  SINGULAIR  Take 10 mg by mouth at bedtime.     olopatadine 0.1 % ophthalmic solution  Commonly known as:  PATANOL  Place 1 drop into both eyes 2 (two) times daily.     ondansetron 4 MG tablet  Commonly known as:  ZOFRAN  Take 1 tablet (4 mg total) by mouth every 6 (six) hours as needed for nausea.     oxyCODONE 5 MG immediate release tablet  Commonly known as:  Oxy IR/ROXICODONE  Take 1-2 tablets (5-10 mg total) by mouth every 3 (three) hours as needed for moderate pain or severe pain.     polyethylene glycol packet  Commonly known as:  MIRALAX / GLYCOLAX  Take 17 g by mouth daily as needed for mild constipation.     sodium phosphate 7-19 GM/118ML Enem  Place 133 mLs (1 enema total) rectally once as needed for severe constipation.     traMADol 50 MG tablet  Commonly known as:  ULTRAM  Take 1-2 tablets (50-100 mg total) by mouth every 6 (six) hours as needed (mild pain).     triamcinolone cream 0.1 %  Commonly known as:  KENALOG  Apply 1 application topically 2 (two) times daily as needed (itching). Reported on 02/04/2015        Immunizations: Immunization History  Administered Date(s) Administered  . PPD Test 03/11/2015     Physical Exam: Filed Vitals:   03/12/15 0959  BP:  124/75  Pulse: 74  Temp: 98 F (36.7 C)  TempSrc: Oral  Resp: 18  Height: 5\' 9"  (1.753 m)  Weight: 181 lb (82.101 kg)  SpO2: 97%   Body mass index is 26.72 kg/(m^2).  General- elderly male, well built, in no acute distress Head- normocephalic, atraumatic Nose- no maxillary or frontal sinus tenderness, no nasal discharge Throat- moist mucus membrane Eyes- PERRLA, EOMI, no pallor, no icterus, no discharge, normal conjunctiva, normal sclera Neck- no cervical lymphadenopathy Cardiovascular- normal s1,s2, no murmur, palpable dorsalis pedis and radial pulses, no leg edema Respiratory- bilateral clear to auscultation, no wheeze, no rhonchi, no crackles, no use of accessory muscles Abdomen- bowel sounds present, soft, non tender Musculoskeletal- able to move all 4 extremities, limited right leg range of motion at the hip Neurological- no focal deficit, alert and oriented to person, place and time Skin- warm and dry, right hip surgical incision with steristrip and a dressing clean and dry Psychiatry- normal mood and affect    Labs reviewed: Basic Metabolic Panel:  Recent Labs  40/98/11 1430 03/09/15 0500 03/10/15 0442  NA 132* 133* 132*  K 4.3 4.4 4.1  CL 97* 103 102  CO2 27 22 23   GLUCOSE 99 173* 142*  BUN 19 19 17   CREATININE 1.02 1.14 0.99  CALCIUM 9.1 8.2* 8.0*   Liver Function Tests:  Recent Labs  03/04/15 1430  AST 30  ALT 21  ALKPHOS 116  BILITOT 1.2  PROT 8.2*  ALBUMIN 4.3   No results for input(s): LIPASE, AMYLASE in the last 8760 hours. No results for input(s): AMMONIA in the last 8760 hours. CBC:  Recent Labs  03/09/15 0500 03/10/15 0442 03/11/15 0419  WBC 13.9* 21.8* 16.1*  HGB 12.1* 10.9* 12.1*  HCT 34.6* 32.0* 35.3*  MCV 94.5 94.7 93.9  PLT 191 195  202   Cardiac Enzymes: No results for input(s): CKTOTAL, CKMB, CKMBINDEX, TROPONINI in the last 8760 hours. BNP: Invalid input(s): POCBNP CBG:  Recent Labs  03/08/15 2218  GLUCAP 161*     Radiological Exams: Dg Pelvis Portable  03/08/2015  CLINICAL DATA:  Postop right anterior hip arthroplasty. EXAM: PORTABLE PELVIS 1-2 VIEWS COMPARISON:  None. FINDINGS: Single AP view demonstrates recent right total hip arthroplasty. A surgical drain is in place. The hardware is well positioned. There is no evidence of acute fracture or dislocation. There is some gas within the soft tissues surrounding the right hip. Vascular calcifications are noted. IMPRESSION: No demonstrated complication following right total hip arthroplasty. Electronically Signed   By: Carey Bullocks M.D.   On: 03/08/2015 19:33   Dg C-arm 1-60 Min-no Report  03/08/2015  CLINICAL DATA: surgery C-ARM 1-60 MINUTES Fluoroscopy was utilized by the requesting physician.  No radiographic interpretation.    Assessment/Plan  Unsteady gait S/p right hip arthroplasty. Will have him work with physical therapy and occupational therapy team to help with gait training and muscle strengthening exercises.fall precautions. Skin care. Encourage to be out of bed.   Right hip OA S/p right total hip arthroplasty. Continue WBAT. Will have patient work with PT/OT as tolerated to regain strength and restore function.  Fall precautions are in place. Has follow up with orthopedics. To apply ted hose to the legs. Continue eliquis for dvt prophylaxis. Continue oxyIR 5 mg 1-2 tab q3h prn pain and tramadol 50 mg 1-2 tab q6h prn pain with robaxin 500 mg q6h prn muscle spasm  Blood loss anemia Post op, check cbc  Hyponatremia Alert and oriented, on hctz which could contribute some. check bmp  Leukocytosis Afebrile, surgical site does not appear infected. Check cbc with diff  Constipation On colace 100 mg bid, prn miralax and prn dulcolax suppository. Has not had a bowel movement for 4 days. Discontinue colace and start senna s 2 tab qhs and change miralax to daily for now. Maintain hydration  HTN Stable bp, continue norvasc 10 mg daily,  hctz 25 mg daily, lisinopril 40 mg bid. Check bp and bmp  Asthma Stable. Continue alvesco, singulair and prn proair  HLD Continue lipitor    Goals of care: short term rehabilitation   Labs/tests ordered: cbc with diff, cmp 03/15/15  Family/ staff Communication: reviewed care plan with patient and nursing supervisor    Oneal Grout, MD Internal Medicine [facility] [address] [place], [place] [address] Cell Phone (Monday-Friday 8 am - 5 pm): [phone] On Call: [phone] and follow prompts after 5 pm and on weekends Office Phone: [phone] Office Fax: [phone]

## 2015-03-15 LAB — HEPATIC FUNCTION PANEL
ALT: 21 U/L (ref 10–40)
AST: 24 U/L (ref 14–40)
Alkaline Phosphatase: 96 U/L (ref 25–125)
Bilirubin, Total: 1 mg/dL

## 2015-03-15 LAB — BASIC METABOLIC PANEL
BUN: 17 mg/dL (ref 4–21)
CREATININE: 1 mg/dL (ref 0.6–1.3)
Glucose: 98 mg/dL
POTASSIUM: 4.2 mmol/L (ref 3.4–5.3)
Sodium: 133 mmol/L — AB (ref 137–147)

## 2015-03-15 LAB — CBC AND DIFFERENTIAL
HCT: 37 % — AB (ref 41–53)
HEMOGLOBIN: 12.4 g/dL — AB (ref 13.5–17.5)
Neutrophils Absolute: 6 /uL
Platelets: 290 10*3/uL (ref 150–399)
WBC: 10.9 10*3/mL

## 2015-03-19 ENCOUNTER — Encounter: Payer: Self-pay | Admitting: Adult Health

## 2015-03-19 ENCOUNTER — Non-Acute Institutional Stay (SKILLED_NURSING_FACILITY): Payer: Federal, State, Local not specified - PPO | Admitting: Adult Health

## 2015-03-19 DIAGNOSIS — D62 Acute posthemorrhagic anemia: Secondary | ICD-10-CM | POA: Diagnosis not present

## 2015-03-19 DIAGNOSIS — K5900 Constipation, unspecified: Secondary | ICD-10-CM

## 2015-03-19 DIAGNOSIS — E785 Hyperlipidemia, unspecified: Secondary | ICD-10-CM

## 2015-03-19 DIAGNOSIS — I10 Essential (primary) hypertension: Secondary | ICD-10-CM

## 2015-03-19 DIAGNOSIS — E871 Hypo-osmolality and hyponatremia: Secondary | ICD-10-CM

## 2015-03-19 DIAGNOSIS — J4541 Moderate persistent asthma with (acute) exacerbation: Secondary | ICD-10-CM

## 2015-03-19 DIAGNOSIS — M1611 Unilateral primary osteoarthritis, right hip: Secondary | ICD-10-CM | POA: Diagnosis not present

## 2015-03-19 DIAGNOSIS — D72829 Elevated white blood cell count, unspecified: Secondary | ICD-10-CM

## 2015-03-19 NOTE — Progress Notes (Signed)
Patient ID: Isaiah Murphy, male   DOB: 04/12/1934, 81 y.o.   MRN: [id number]    DATE:  03/19/2015   MRN:  [id number]  BIRTHDAY: 04-Oct-1934  Facility:  Nursing Home Location:  [facility]  Nursing Home Room Number: [id number]  LEVEL OF CARE:  SNF [phone])  Contact Information    Name Relation Home Work Mobile   Bour,Pearl Spouse [phone]  [phone]   Driggers,Renee Daughter [phone]       Code Status History    Date Active Date Inactive Code Status Order ID Comments User Context   03/08/2015  8:13 PM 03/11/2015  3:21 PM Full Code [id number]  Ollen Gross, MD Inpatient   04/25/2013  4:15 PM 04/29/2013  2:13 PM Full Code [id number]  Abelino Derrick, PA-C ED       Chief Complaint  Patient presents with  . Discharge Note    HISTORY OF PRESENT ILLNESS:  This is a 81 year old male who is for discharge home with home health PT for endurance, OT for ADLs and CNA for showers.  DME:  Rolling walker and 3 in 1 bedside commode. He has been admitted to [facility] on 03/11/15 from [facility] with osteoarthritis of right hip for which she had right total hip arthroplasty on 03/08/15.  Patient was admitted to this facility for short-term rehabilitation after the patient's recent hospitalization.  Patient has completed SNF rehabilitation and therapy has cleared the patient for discharge.   PAST MEDICAL HISTORY:  Past Medical History  Diagnosis Date  . Hypertension   . Dyslipidemia   . Moderate persistent asthma with acute exacerbation in adult   . Allergic rhinitis   . Presence of permanent cardiac pacemaker   . Sleep apnea     pt states not currently using CPAP machine   . History of bronchitis   . GERD (gastroesophageal reflux disease)   . Arthritis   . Dysrhythmia     history of paroxysmal A Fib and typical A Flutter   . History of sick sinus syndrome   . History of syncope   . Unsteady gait   . Primary osteoarthritis of right hip    . Acute blood loss anemia   . Leukocytosis   . Constipation   . Hyponatremia   . HLD (hyperlipidemia)      CURRENT MEDICATIONS: Reviewed  Patient's Medications  New Prescriptions   No medications on file  Previous Medications   ACETAMINOPHEN (TYLENOL) 325 MG TABLET    Take 2 tablets (650 mg total) by mouth every 6 (six) hours as needed for mild pain (or Fever >/= 101).   ALBUTEROL (PROAIR HFA) 108 (90 BASE) MCG/ACT INHALER    Inhale 2 puffs into the lungs every 4 (four) hours as needed for wheezing or shortness of breath (cough).   AMLODIPINE (NORVASC) 10 MG TABLET    Take 10 mg by mouth every morning.    APIXABAN (ELIQUIS) 5 MG TABS TABLET    Take 1 tablet (5 mg total) by mouth 2 (two) times daily.   ASPIRIN EC 81 MG EC TABLET    Take 1 tablet (81 mg total) by mouth daily.   ATORVASTATIN (LIPITOR) 20 MG TABLET    Take 20 mg by mouth at bedtime.    BISACODYL (DULCOLAX) 10 MG SUPPOSITORY    Place 1 suppository (10 mg total) rectally daily as needed for moderate constipation.   CHLORPHENIRAMINE-HYDROCODONE (TUSSIONEX PENNKINETIC ER) 10-8 MG/5ML SUER  Take 5 mLs by mouth every 12 (twelve) hours as needed for cough.   CICLESONIDE (ALVESCO) 160 MCG/ACT INHALER    Use one puff twice daily to prevent cough or wheeze.  Increase to 2 puffs twice daily with asthma flare. Rinse, gargle and spit after use   DIPHENHYDRAMINE (BENADRYL) 12.5 MG/5ML ELIXIR    Take 5-10 mLs (12.5-25 mg total) by mouth every 4 (four) hours as needed for itching.   ESOMEPRAZOLE (NEXIUM) 40 MG CAPSULE    Take 40 mg by mouth daily. Reported on 02/04/2015   HYDROCHLOROTHIAZIDE (HYDRODIURIL) 25 MG TABLET    Take 25 mg by mouth at bedtime.    LISINOPRIL (PRINIVIL,ZESTRIL) 40 MG TABLET    Take 40 mg by mouth 2 (two) times daily.   METHOCARBAMOL (ROBAXIN) 500 MG TABLET    Take 1 tablet (500 mg total) by mouth every 6 (six) hours as needed for muscle spasms.   METOCLOPRAMIDE (REGLAN) 5 MG TABLET    Take 1 tablet (5 mg total) by  mouth every 8 (eight) hours as needed for nausea (if ondansetron (ZOFRAN) ineffective.).   MOMETASONE (NASONEX) 50 MCG/ACT NASAL SPRAY    Place 2 sprays into the nose daily.    MONTELUKAST (SINGULAIR) 10 MG TABLET    Take 10 mg by mouth at bedtime.   OLOPATADINE (PATANOL) 0.1 % OPHTHALMIC SOLUTION    Place 1 drop into both eyes 2 (two) times daily.   ONDANSETRON (ZOFRAN) 4 MG TABLET    Take 1 tablet (4 mg total) by mouth every 6 (six) hours as needed for nausea.   OXYCODONE (OXY IR/ROXICODONE) 5 MG IMMEDIATE RELEASE TABLET    Take 1-2 tablets (5-10 mg total) by mouth every 3 (three) hours as needed for moderate pain or severe pain.   POLYETHYLENE GLYCOL (MIRALAX / GLYCOLAX) PACKET    Take 17 g by mouth daily as needed for mild constipation.   SENNOSIDES-DOCUSATE SODIUM (SENOKOT-S) 8.6-50 MG TABLET    Take 2 tablets by mouth at bedtime.   SODIUM PHOSPHATE (FLEET) 7-19 GM/118ML ENEM    Place 133 mLs (1 enema total) rectally once as needed for severe constipation.   TRAMADOL (ULTRAM) 50 MG TABLET    Take 1-2 tablets (50-100 mg total) by mouth every 6 (six) hours as needed (mild pain).   TRIAMCINOLONE CREAM (KENALOG) 0.1 %    Apply 1 application topically 2 (two) times daily as needed (itching). Reported on 02/04/2015  Modified Medications   No medications on file  Discontinued Medications   DOCUSATE SODIUM (COLACE) 100 MG CAPSULE    Take 1 capsule (100 mg total) by mouth 2 (two) times daily.     Allergies  Allergen Reactions  . Penicillins     Has patient had a PCN reaction causing immediate rash, facial/tongue/throat swelling, SOB or lightheadedness with hypotension: No Has patient had a PCN reaction causing severe rash involving mucus membranes or skin necrosis: No Has patient had a PCN reaction that required hospitalization No Has patient had a PCN reaction occurring within the last 10 years: No If all of the above answers are "NO", then may proceed with Cephalosporin use. Rash at the  injection site     REVIEW OF SYSTEMS:  GENERAL: no change in appetite, no fatigue, no weight changes, no fever, chills or weakness EYES: Denies change in vision, dry eyes, eye pain, itching or discharge EARS: Denies change in hearing, ringing in ears, or earache NOSE: Denies nasal congestion or epistaxis MOUTH and THROAT: Denies  oral discomfort, gingival pain or bleeding, pain from teeth or hoarseness   RESPIRATORY: no cough, SOB, DOE, wheezing, hemoptysis CARDIAC: no chest pain, edema or palpitations GI: no abdominal pain, diarrhea, constipation, heart burn, nausea or vomiting GU: Denies dysuria, frequency, hematuria, incontinence, or discharge PSYCHIATRIC: Denies feeling of depression or anxiety. No report of hallucinations, insomnia, paranoia, or agitation   PHYSICAL EXAMINATION  GENERAL APPEARANCE: Well nourished. In no acute distress. Normal body habitus SKIN:  Left hip surgical incision is covered with steri-strip and dry dressing, no erythema HEAD: Normal in size and contour. No evidence of trauma EYES: Lids open and close normally. No blepharitis, entropion or ectropion. PERRL. Conjunctivae are clear and sclerae are white. Lenses are without opacity EARS: Pinnae are normal. Patient hears normal voice tunes of the examiner MOUTH and THROAT: Lips are without lesions. Oral mucosa is moist and without lesions. Tongue is normal in shape, size, and color and without lesions NECK: supple, trachea midline, no neck masses, no thyroid tenderness, no thyromegaly LYMPHATICS: no LAN in the neck, no supraclavicular LAN RESPIRATORY: breathing is even & unlabored, BS CTAB CARDIAC: RRR, no murmur,no extra heart sounds, RLE edema 1+; has left chest pacemaker GI: abdomen soft, normal BS, no masses, no tenderness, no hepatomegaly, no splenomegaly EXTREMITIES:  Able to move X 4 extremities PSYCHIATRIC: Alert and oriented X 3. Affect and behavior are appropriate  LABS/RADIOLOGY: Labs  reviewed: Basic Metabolic Panel:  Recent Labs  14/78/29 1430 03/09/15 0500 03/10/15 0442 03/15/15  NA 132* 133* 132* 133*  K 4.3 4.4 4.1 4.2  CL 97* 103 102  --   CO2 --   GLUCOSE 99 173* 142*  --   BUN CREATININE 1.02 1.14 0.99 1.0  CALCIUM 9.1 8.2* 8.0*  --    Liver Function Tests:  Recent Labs  03/04/15 1430 03/15/15  AST 30 24  ALT 21 21  ALKPHOS 116 96  BILITOT 1.2  --   PROT 8.2*  --   ALBUMIN 4.3  --    CBC:  Recent Labs  03/09/15 0500 03/10/15 0442 03/11/15 0419 03/15/15  WBC 13.9* 21.8* 16.1* 10.9  NEUTROABS  --   --   --  6  HGB 12.1* 10.9* 12.1* 12.4*  HCT 34.6* 32.0* 35.3* 37*  MCV 94.5 94.7 93.9  --   PLT 191 195 202 290   CBG:  Recent Labs  03/08/15 2218  GLUCAP 161*     Dg Pelvis Portable  03/08/2015  CLINICAL DATA:  Postop right anterior hip arthroplasty. EXAM: PORTABLE PELVIS 1-2 VIEWS COMPARISON:  None. FINDINGS: Single AP view demonstrates recent right total hip arthroplasty. A surgical drain is in place. The hardware is well positioned. There is no evidence of acute fracture or dislocation. There is some gas within the soft tissues surrounding the right hip. Vascular calcifications are noted. IMPRESSION: No demonstrated complication following right total hip arthroplasty. Electronically Signed   By: Carey Bullocks M.D.   On: 03/08/2015 19:33   Dg C-arm 1-60 Min-no Report  03/08/2015  CLINICAL DATA: surgery C-ARM 1-60 MINUTES Fluoroscopy was utilized by the requesting physician.  No radiographic interpretation.    ASSESSMENT/PLAN:  Right hip osteoarthritis S/P right total hip arthroplasty - for home health PT, OT and CNA; RLE WBAT; continue Eliquis 5 mg twice a day for DVT prophylaxis; OxyIR 5 mg 1-2 tabs by mouth every 3 hours when necessary and tramadol 50 mg 1-2 tabs by mouth every  6 hours when necessary for pain; Robaxin 500 mg 1 tab by mouth every 6 hours when necessary for muscle spasm; follow-up with Dr.  Lequita Halt, orthopedic surgeon, on 03/23/15  Anemia, acute blood loss - hgb 12.4; recheck hgb 12.4, stable  Hyponatremia - NA 133; recheck NA 133, stable   Leukocytosis - wbc 12.4; recheck WBC 10.9, improved  Constipation - stable; continue senna S2 tabs by mouth daily at bedtime, MiraLAX 17 g daily when necessary, Dulcolax 10 mg suppository daily when necessary and Fleets enema when necessary  Asthma - stable; continue Alvesco inhaler 1 puff twice a day, Singulair 10 mg daily, pro-air when necessary and Nasonex 50 g 2 sprays into nose daily  Hypertension - well controlled; continue Norvasc 10 mg 1 tab daily, HCTZ 25 mg 1 tab daily and lisinopril 40 mg twice a day  Hyperlipidemia - continue atorvastatin 20 mg 1 tab by mouth daily at bedtime      I have filled out patient's discharge paperwork and written prescriptions.  Patient will receive home health PT, OT and CNA.  DME provided:  Rolling walker and 3 in 1 bedside commode  Total discharge time: Greater than 30 minutes  Discharge time involved coordination of the discharge process with Child psychotherapist, nursing staff and therapy department. Medical justification for home health services/DME verified.    [facility], NP [employer] [phone]

## 2015-03-20 IMAGING — CR DG CHEST 2V
2 series · 2 of 2 positions shown · non-contrast
Comparison: None.

CLINICAL DATA: Atrial flutter

EXAM:
CHEST  2 VIEW

[w chest pa]
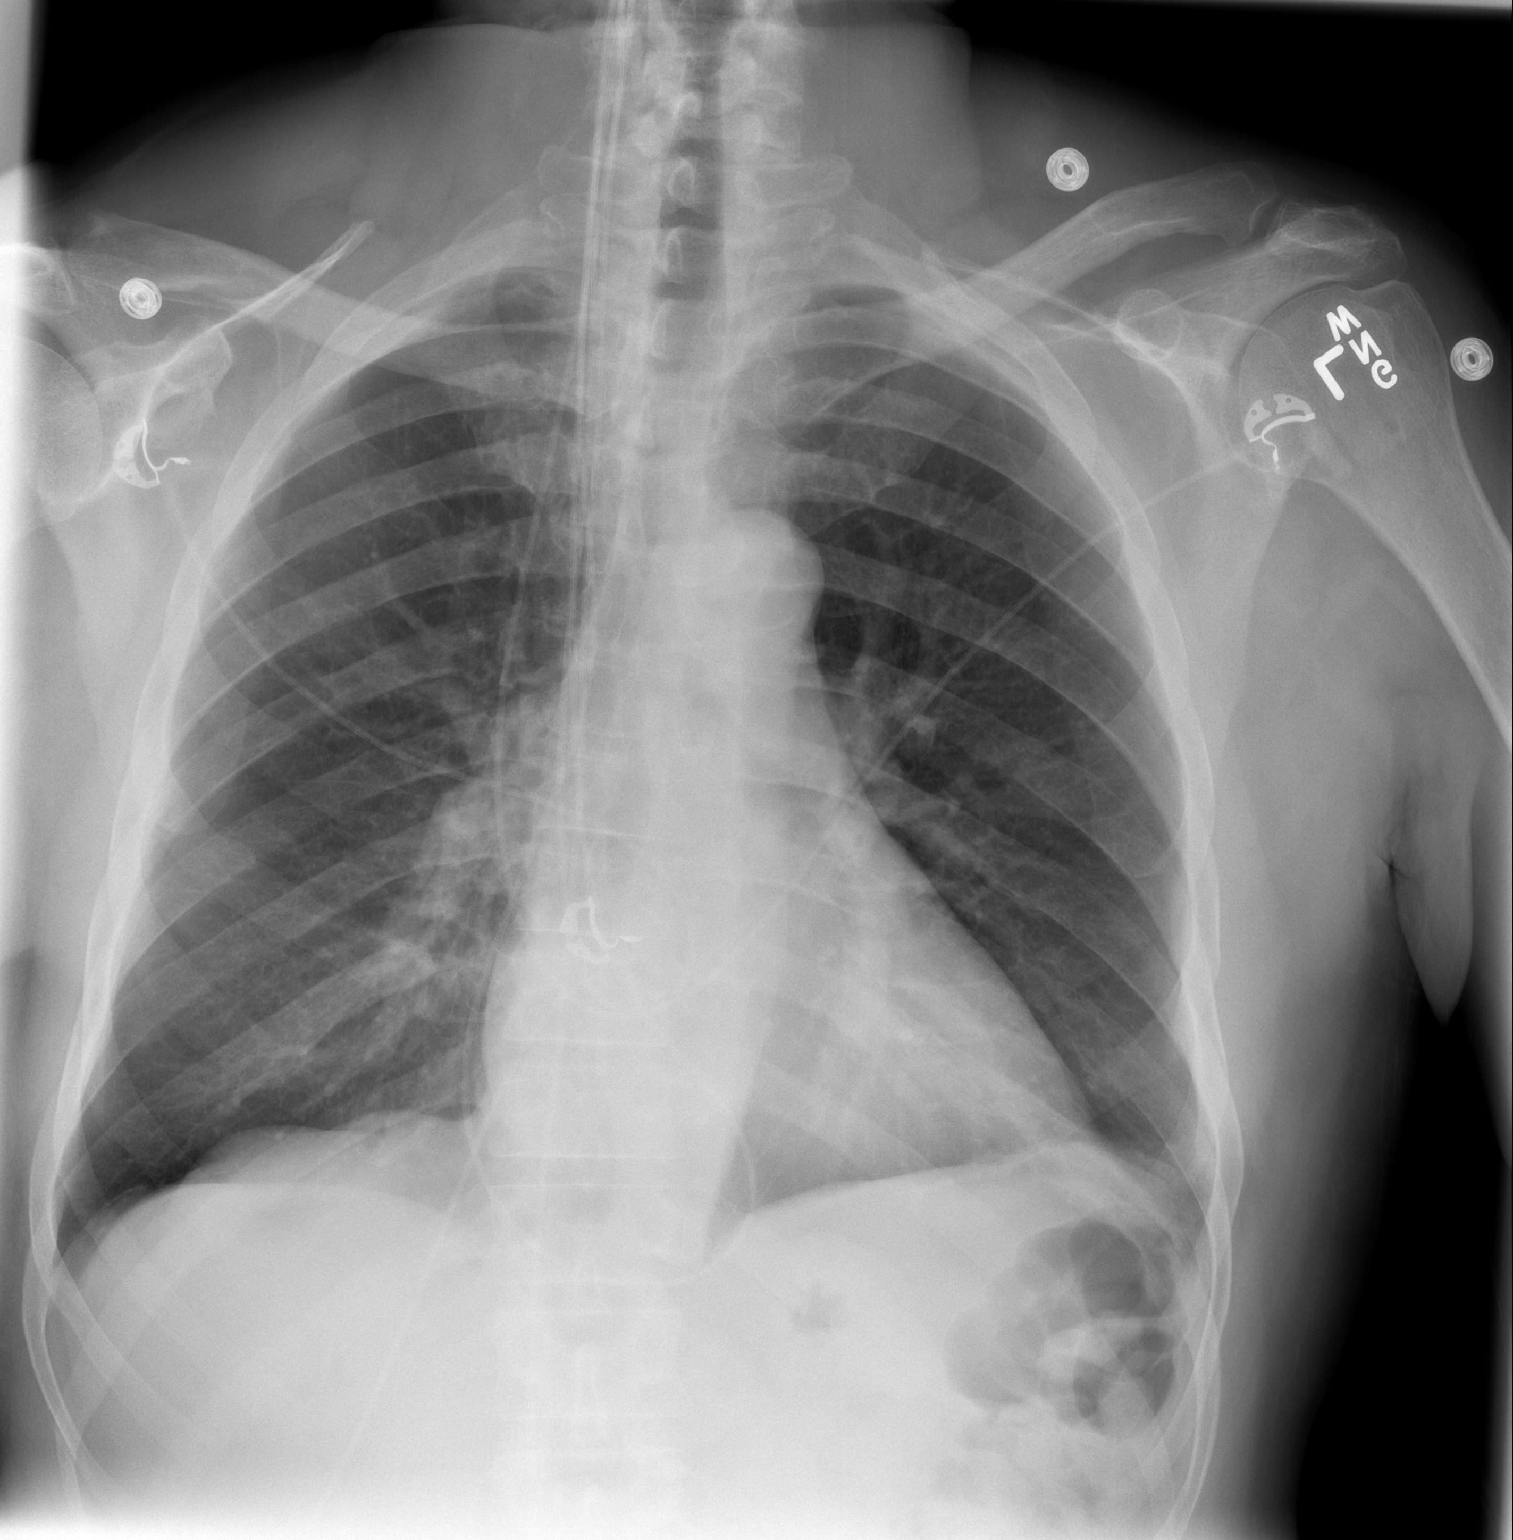

[w chest lat]
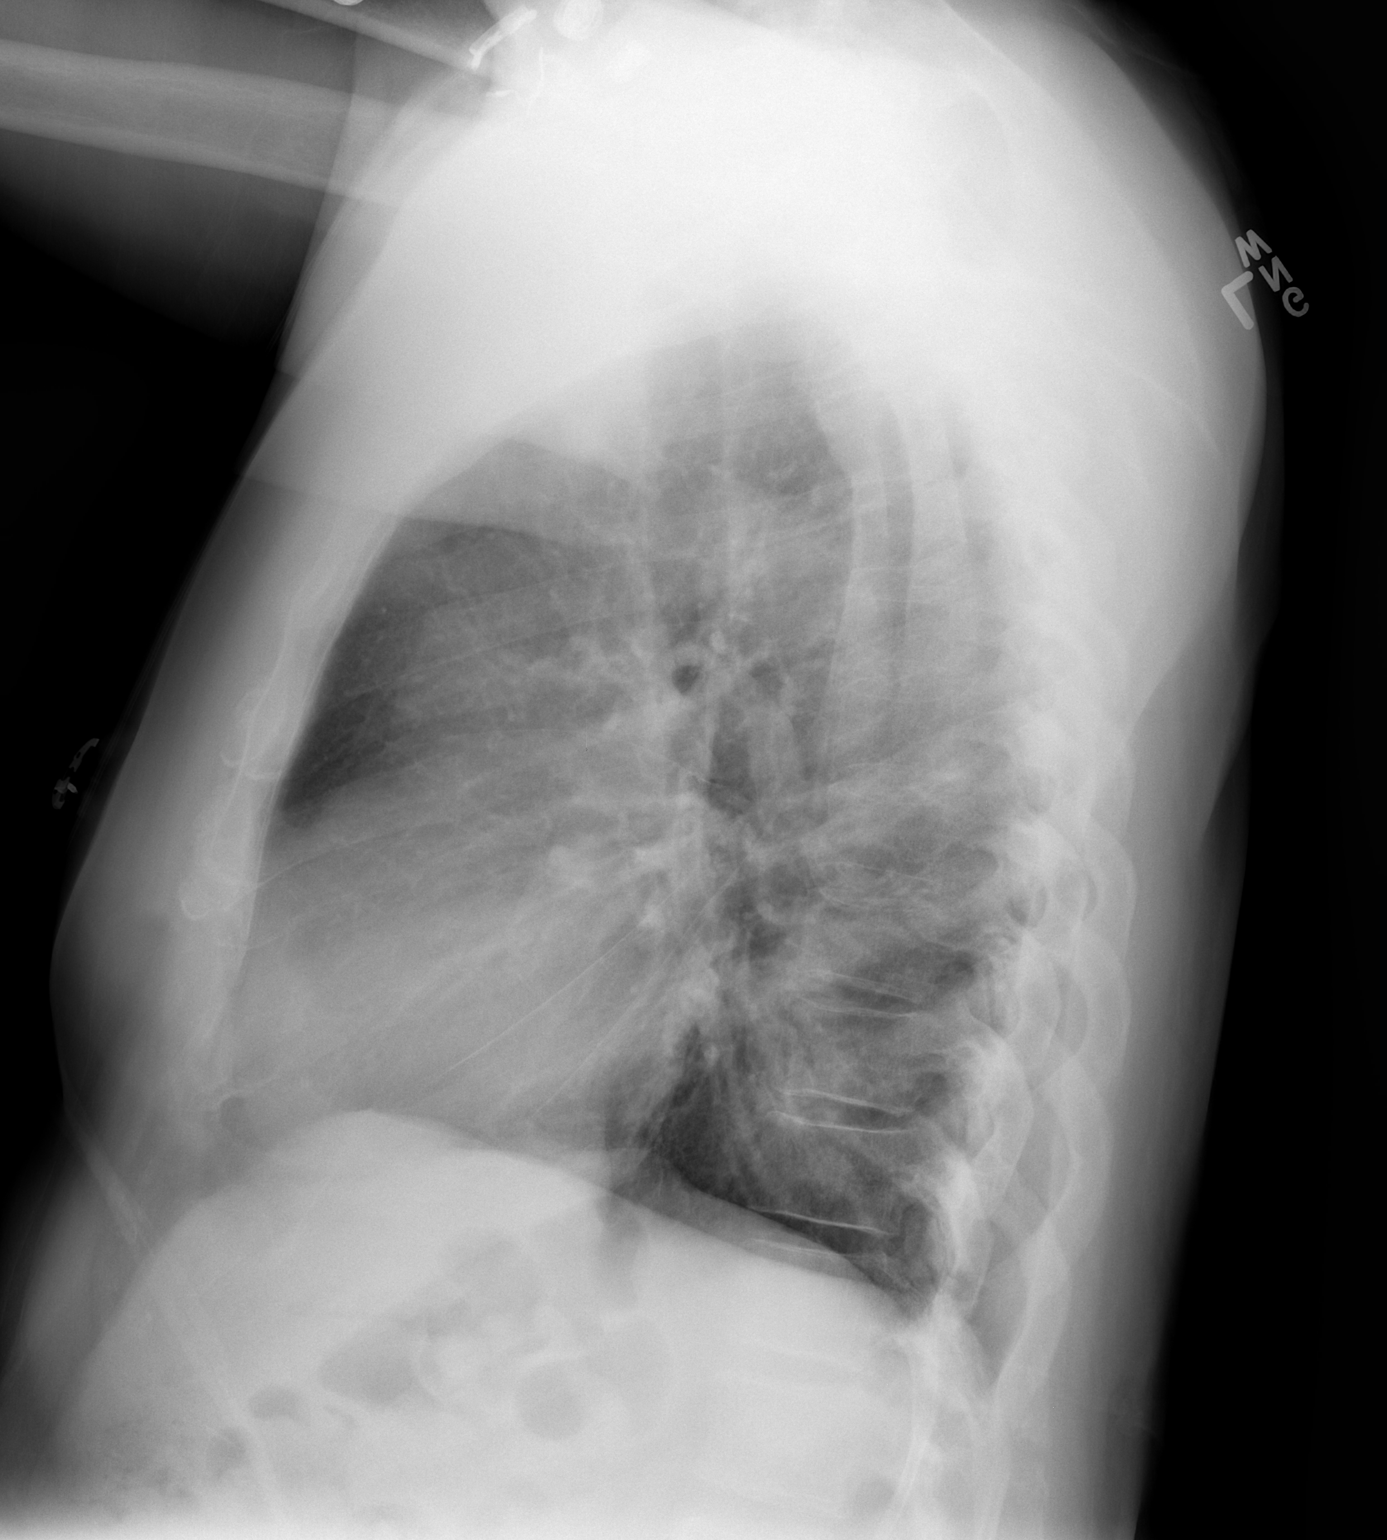

[2 of 2 positions shown; findings below may reference images not displayed]

FINDINGS: Numerous external wires overlie the thorax. Normal cardiac
silhouette. No effusion, infiltrate, pneumothorax. Lungs are mildly
hyperinflated.
IMPRESSION: Mildly hyperinflated lungs.  No acute findings.

## 2015-04-12 ENCOUNTER — Encounter (HOSPITAL_COMMUNITY): Payer: Self-pay | Admitting: Emergency Medicine

## 2015-04-12 ENCOUNTER — Telehealth: Payer: Self-pay | Admitting: Interventional Cardiology

## 2015-04-12 ENCOUNTER — Emergency Department (HOSPITAL_COMMUNITY)
Admission: EM | Admit: 2015-04-12 | Discharge: 2015-04-12 | Disposition: A | Discharge status: Home / Self Care | Payer: Federal, State, Local not specified - PPO | Attending: Emergency Medicine | Admitting: Emergency Medicine

## 2015-04-12 DIAGNOSIS — K5900 Constipation, unspecified: Secondary | ICD-10-CM | POA: Insufficient documentation

## 2015-04-12 DIAGNOSIS — E871 Hypo-osmolality and hyponatremia: Secondary | ICD-10-CM | POA: Insufficient documentation

## 2015-04-12 DIAGNOSIS — Z79899 Other long term (current) drug therapy: Secondary | ICD-10-CM | POA: Insufficient documentation

## 2015-04-12 DIAGNOSIS — J4541 Moderate persistent asthma with (acute) exacerbation: Secondary | ICD-10-CM | POA: Diagnosis not present

## 2015-04-12 DIAGNOSIS — Z7982 Long term (current) use of aspirin: Secondary | ICD-10-CM | POA: Diagnosis not present

## 2015-04-12 DIAGNOSIS — R55 Syncope and collapse: Secondary | ICD-10-CM | POA: Diagnosis not present

## 2015-04-12 DIAGNOSIS — Z7951 Long term (current) use of inhaled steroids: Secondary | ICD-10-CM | POA: Insufficient documentation

## 2015-04-12 DIAGNOSIS — M1611 Unilateral primary osteoarthritis, right hip: Secondary | ICD-10-CM | POA: Diagnosis not present

## 2015-04-12 DIAGNOSIS — Z862 Personal history of diseases of the blood and blood-forming organs and certain disorders involving the immune mechanism: Secondary | ICD-10-CM | POA: Insufficient documentation

## 2015-04-12 DIAGNOSIS — R21 Rash and other nonspecific skin eruption: Secondary | ICD-10-CM | POA: Insufficient documentation

## 2015-04-12 DIAGNOSIS — I10 Essential (primary) hypertension: Secondary | ICD-10-CM | POA: Insufficient documentation

## 2015-04-12 DIAGNOSIS — G4730 Sleep apnea, unspecified: Secondary | ICD-10-CM | POA: Diagnosis not present

## 2015-04-12 DIAGNOSIS — Z950 Presence of cardiac pacemaker: Secondary | ICD-10-CM | POA: Diagnosis not present

## 2015-04-12 DIAGNOSIS — K219 Gastro-esophageal reflux disease without esophagitis: Secondary | ICD-10-CM | POA: Insufficient documentation

## 2015-04-12 DIAGNOSIS — M2550 Pain in unspecified joint: Secondary | ICD-10-CM | POA: Diagnosis not present

## 2015-04-12 DIAGNOSIS — R42 Dizziness and giddiness: Secondary | ICD-10-CM | POA: Diagnosis present

## 2015-04-12 DIAGNOSIS — E785 Hyperlipidemia, unspecified: Secondary | ICD-10-CM | POA: Diagnosis not present

## 2015-04-12 DIAGNOSIS — Z880 Allergy status to penicillin: Secondary | ICD-10-CM | POA: Insufficient documentation

## 2015-04-12 LAB — BASIC METABOLIC PANEL
Anion gap: 7 (ref 5–15)
BUN: 14 mg/dL (ref 6–20)
CHLORIDE: 103 mmol/L (ref 101–111)
CO2: 28 mmol/L (ref 22–32)
CREATININE: 1.04 mg/dL (ref 0.61–1.24)
Calcium: 9 mg/dL (ref 8.9–10.3)
GFR calc Af Amer: >60 mL/min (ref >60)
GFR calc non Af Amer: >60 mL/min (ref >60)
Glucose, Bld: 110 mg/dL — ABNORMAL HIGH (ref 65–99)
Potassium: 4.4 mmol/L (ref 3.5–5.1)
Sodium: 138 mmol/L (ref 135–145)

## 2015-04-12 LAB — CBC WITH DIFFERENTIAL/PLATELET
BASOS ABS: 0.1 10*3/uL (ref 0.0–0.1)
Basophils Relative: 1 %
EOS ABS: 0.2 10*3/uL (ref 0.0–0.7)
EOS PCT: 2 %
HCT: 40.4 % (ref 39.0–52.0)
Hemoglobin: 13.6 g/dL (ref 13.0–17.0)
LYMPHS ABS: 1.7 10*3/uL (ref 0.7–4.0)
Lymphocytes Relative: 17 %
MCH: 31.3 pg (ref 26.0–34.0)
MCHC: 33.7 g/dL (ref 30.0–36.0)
MCV: 92.9 fL (ref 78.0–100.0)
MONO ABS: 0.6 10*3/uL (ref 0.1–1.0)
MONOS PCT: 6 %
Neutro Abs: 7.5 10*3/uL (ref 1.7–7.7)
Neutrophils Relative %: 74 %
PLATELETS: 227 10*3/uL (ref 150–400)
RBC: 4.35 MIL/uL (ref 4.22–5.81)
RDW: 12.8 % (ref 11.5–15.5)
WBC: 10.1 10*3/uL (ref 4.0–10.5)

## 2015-04-12 LAB — CBG MONITORING, ED: GLUCOSE-CAPILLARY: 81 mg/dL (ref 65–99)

## 2015-04-12 NOTE — Telephone Encounter (Signed)
Will fwd message to Dr.Smith to review and advise on f/u

## 2015-04-12 NOTE — ED Provider Notes (Signed)
CSN: [id number]     Arrival date & time 04/12/15  1034 History   First MD Initiated Contact with Patient 04/12/15 1106     Chief Complaint  Patient presents with  . Dizziness     (Consider location/radiation/quality/duration/timing/severity/associated sxs/prior Treatment) HPI  Isaiah Murphy is a 81 year old gentleman with a PMH of symptomatic sick sinus syndrome s/p [employer] pacemaker placement, HTN, asthma, and recent right hip arthroplasty (5 weeks ago, on apixipan) who presents with loss of consciousness. The patient was at a rehab session at [facility] on a stationary bicycle. 7 minutes into it, he started to feel light-headed, diaphoretic. By 8 minutes, he felt himself starting to fall over. This next memory is being surrounded by people trying to help him. He did not feel confused after the event, and reports that it was similar to episodes he's had in the past due to his sick sinus syndrome. He was reportedly unconscious for no longer than a couple minutes. He denies any chest pain, leg swelling, or calf pain, and he only experiences dyspnea when he was on the bike.  Reviewing his medication list, he indicates the he is NOT taking Tussionex, oxycodone, diphenhydramine, zofran, or reglan. He IS taking tramadol, which he took prior to exercising. His only other complaint is a mild chest rash that has responded well to steroid cream. He is adherent with his antihypertensives and apixiban, and there have been no recent changes to his antihypertensives.   Past Medical History  Diagnosis Date  . Hypertension   . Dyslipidemia   . Moderate persistent asthma with acute exacerbation in adult   . Allergic rhinitis   . Presence of permanent cardiac pacemaker   . Sleep apnea     pt states not currently using CPAP machine   . History of bronchitis   . GERD (gastroesophageal reflux disease)   . Arthritis   . Dysrhythmia     history of paroxysmal A Fib and typical A Flutter   . History of  sick sinus syndrome   . History of syncope   . Unsteady gait   . Primary osteoarthritis of right hip   . Acute blood loss anemia   . Leukocytosis   . Constipation   . Hyponatremia   . HLD (hyperlipidemia)    Past Surgical History  Procedure Laterality Date  . Hemorrhoid surgery    . Tee without cardioversion N/A 04/28/2013    Procedure: TRANSESOPHAGEAL ECHOCARDIOGRAM (TEE);  Surgeon: Lars Masson, MD;  Location: [facility] ENDOSCOPY;  Service: Cardiovascular;  Laterality: N/A;  . Cardioversion N/A 04/28/2013    Procedure: CARDIOVERSION;  Surgeon: Lars Masson, MD;  Location: [facility] ENDOSCOPY;  Service: Cardiovascular;  Laterality: N/A;  . Left shoulder surgery       secondary to torn ligament / subluxation  . Total hip arthroplasty Right 03/08/2015    Procedure: RIGHT TOTAL HIP ARTHROPLASTY ANTERIOR APPROACH;  Surgeon: Ollen Gross, MD;  Location: [facility] ORS;  Service: Orthopedics;  Laterality: Right;   Family History  Problem Relation Age of Onset  . Hypertension Father   . Allergic rhinitis Neg Hx   . Asthma Neg Hx   . Eczema Neg Hx   . Immunodeficiency Neg Hx   . Urticaria Neg Hx    Social History  Substance Use Topics  . Smoking status: Never Smoker   . Smokeless tobacco: Never Used  . Alcohol Use: 0.0 oz/week    0 Standard drinks or equivalent per week  Comment: socially     Review of Systems  Constitutional: Negative for fever and chills.  HENT: Negative for congestion and sore throat.   Eyes: Negative for photophobia and visual disturbance.  Respiratory: Negative for cough, shortness of breath and wheezing.   Cardiovascular: Negative for chest pain and leg swelling.  Gastrointestinal: Negative for nausea and abdominal pain.  Endocrine: Negative for polydipsia and polyuria.  Genitourinary: Negative for urgency and difficulty urinating.  Musculoskeletal: Positive for arthralgias. Negative for back pain.  Skin: Positive for rash. Negative for pallor.  Neurological:  Positive for dizziness and light-headedness.       Symptoms have resolved.  Psychiatric/Behavioral: Negative for dysphoric mood. The patient is not nervous/anxious.   All other systems reviewed and are negative.     Allergies  Penicillins  Home Medications   Prior to Admission medications   Medication Sig Start Date End Date Taking? Authorizing Provider  amLODipine (NORVASC) 10 MG tablet Take 10 mg by mouth every morning.    Yes Historical Provider, MD  apixaban (ELIQUIS) 5 MG TABS tablet Take 1 tablet (5 mg total) by mouth 2 (two) times daily. 04/29/13  Yes Janetta Hora, PA-C  atorvastatin (LIPITOR) 20 MG tablet Take 20 mg by mouth at bedtime.    Yes Historical Provider, MD  ciclesonide (ALVESCO) 160 MCG/ACT inhaler Use one puff twice daily to prevent cough or wheeze.  Increase to 2 puffs twice daily with asthma flare. Rinse, gargle and spit after use 02/04/15  Yes Mikki Santee, MD  esomeprazole (NEXIUM) 40 MG capsule Take 40 mg by mouth daily. Reported on 02/04/2015   Yes Historical Provider, MD  hydrochlorothiazide (HYDRODIURIL) 25 MG tablet Take 25 mg by mouth at bedtime.    Yes Historical Provider, MD  lisinopril (PRINIVIL,ZESTRIL) 40 MG tablet Take 40 mg by mouth 2 (two) times daily.   Yes Historical Provider, MD  mometasone (NASONEX) 50 MCG/ACT nasal spray Place 2 sprays into the nose daily.    Yes Historical Provider, MD  montelukast (SINGULAIR) 10 MG tablet Take 10 mg by mouth at bedtime.   Yes Historical Provider, MD  olopatadine (PATANOL) 0.1 % ophthalmic solution Place 1 drop into both eyes 2 (two) times daily.   Yes Historical Provider, MD  sennosides-docusate sodium (SENOKOT-S) 8.6-50 MG tablet Take 2 tablets by mouth as needed.    Yes Historical Provider, MD  traMADol (ULTRAM) 50 MG tablet Take 1-2 tablets (50-100 mg total) by mouth every 6 (six) hours as needed (mild pain). 03/11/15  Yes Avel Peace, PA-C  triamcinolone cream (KENALOG) 0.1 % Apply 1 application  topically 2 (two) times daily as needed (itching). Reported on 02/04/2015   Yes Historical Provider, MD  acetaminophen (TYLENOL) 325 MG tablet Take 2 tablets (650 mg total) by mouth every 6 (six) hours as needed for mild pain (or Fever >/= 101). Patient not taking: Reported on 04/12/2015 03/11/15   Avel Peace, PA-C  albuterol [facility] HFA) [phone] Base) MCG/ACT inhaler Inhale 2 puffs into the lungs every 4 (four) hours as needed for wheezing or shortness of breath (cough). 02/04/15   Mikki Santee, MD  aspirin EC 81 MG EC tablet Take 1 tablet (81 mg total) by mouth daily. Patient not taking: Reported on 04/12/2015 04/29/13   Janetta Hora, PA-C  bisacodyl (DULCOLAX) 10 MG suppository Place 1 suppository (10 mg total) rectally daily as needed for moderate constipation. Patient not taking: Reported on 04/12/2015 03/11/15   Avel Peace, PA-C  chlorpheniramine-HYDROcodone [facility] ER)  10-8 MG/5ML SUER Take 5 mLs by mouth every 12 (twelve) hours as needed for cough. Patient not taking: Reported on 04/12/2015 02/04/15   Mikki Santee, MD  diphenhydrAMINE (BENADRYL) 12.5 MG/5ML elixir Take 5-10 mLs (12.5-25 mg total) by mouth every 4 (four) hours as needed for itching. Patient not taking: Reported on 04/12/2015 03/11/15   Avel Peace, PA-C  methocarbamol (ROBAXIN) 500 MG tablet Take 1 tablet (500 mg total) by mouth every 6 (six) hours as needed for muscle spasms. Patient not taking: Reported on 04/12/2015 03/11/15   Avel Peace, PA-C  metoCLOPramide (REGLAN) 5 MG tablet Take 1 tablet (5 mg total) by mouth every 8 (eight) hours as needed for nausea (if ondansetron (ZOFRAN) ineffective.). Patient not taking: Reported on 04/12/2015 03/11/15   Avel Peace, PA-C  ondansetron (ZOFRAN) 4 MG tablet Take 1 tablet (4 mg total) by mouth every 6 (six) hours as needed for nausea. Patient not taking: Reported on 04/12/2015 03/11/15   Avel Peace, PA-C  oxyCODONE (OXY IR/ROXICODONE) 5 MG immediate release tablet Take 1-2  tablets (5-10 mg total) by mouth every 3 (three) hours as needed for moderate pain or severe pain. Patient not taking: Reported on 04/12/2015 03/11/15   Avel Peace, PA-C  polyethylene glycol [facility]) packet Take 17 g by mouth daily as needed for mild constipation. Patient not taking: Reported on 04/12/2015 03/11/15   Avel Peace, PA-C  sodium phosphate (FLEET) 7-19 GM/118ML ENEM Place 133 mLs (1 enema total) rectally once as needed for severe constipation. Patient not taking: Reported on 04/12/2015 03/11/15   Avel Peace, PA-C   BP 130/92 mmHg  Pulse 70  Temp(Src) 97.9 F (36.6 C) (Oral)  Resp 18  SpO2 95% Physical Exam  Constitutional: He is oriented to person, place, and time. He appears well-developed and well-nourished. No distress.  HENT:  Head: Normocephalic and atraumatic.  Mouth/Throat: Oropharynx is clear and moist. No oropharyngeal exudate.  Eyes: EOM are normal. Pupils are equal, round, and reactive to light. No scleral icterus.  Neck: Normal range of motion. Neck supple. No tracheal deviation present. No thyromegaly present.  Cardiovascular: Normal rate, regular rhythm and normal heart sounds.   No murmur heard. Pulmonary/Chest: Effort normal and breath sounds normal. No respiratory distress. He has no wheezes.  Abdominal: Soft. Bowel sounds are normal. He exhibits no distension. There is no tenderness.  Musculoskeletal: He exhibits no edema or tenderness.  Neurological: He is alert and oriented to person, place, and time.  Skin: Skin is warm and dry. He is not diaphoretic.  Faint diffuse rash on chest. No petechiae  Psychiatric: He has a normal mood and affect. His behavior is normal.  Nursing note and vitals reviewed.   ED Course  Procedures (including critical care time) Labs Review Labs Reviewed  BASIC METABOLIC PANEL - Abnormal; Notable for the following:    Glucose, Bld 110 (*)    All other components within normal limits  CBC WITH DIFFERENTIAL/PLATELET   CBG MONITORING, ED    No data found.  CBC Latest Ref Rng 04/12/2015 03/15/2015 03/11/2015  WBC 4.0 - 10.5 K/uL 10.1 10.9 16.1(H)  Hemoglobin 13.0 - 17.0 g/dL 16.1 12.4(A) 12.1(L)  Hematocrit 39.0 - 52.0 % 40.4 37(A) 35.3(L)  Platelets 150 - 400 K/uL 227 290 202   CMP Latest Ref Rng 04/12/2015 03/15/2015 03/10/2015  Glucose 65 - 99 mg/dL 096(E) - 454(U)  BUN 6 - 20 mg/dL 14 17 17   Creatinine 0.61 - 1.24 mg/dL 9.81 1.0 1.91  Sodium  135 - 145 mmol/L 138 133(A) 132(L)  Potassium 3.5 - 5.1 mmol/L 4.4 4.2 4.1  Chloride 101 - 111 mmol/L 103 - 102  CO2 22 - 32 mmol/L 28 - 23  Calcium 8.9 - 10.3 mg/dL 9.0 - 8.0(L)  Total Protein 6.5 - 8.1 g/dL - - -  Total Bilirubin 0.3 - 1.2 mg/dL - - -  Alkaline Phos 25 - 125 U/L - 96 -  AST 14 - 40 U/L - 24 -  ALT 10 - 40 U/L - 21 -       Imaging Review No results found. I have personally reviewed and evaluated these images and lab results as part of my medical decision-making.   EKG Interpretation   Date/Time:  Monday April 12 2015 10:39:48 EST Ventricular Rate:  74 PR Interval:  212 QRS Duration: 94 QT Interval:  369 QTC Calculation: 409 R Axis:   3 Text Interpretation:  Sinus rhythm Atrial premature complex Borderline T  abnormalities, lateral leads Confirmed by [facility] MD, Barbara CowerJASON [phone] on  04/12/2015 1:54:11 PM      MDM   Final diagnoses:  Syncope, unspecified syncope type   Potential etiologies of this exertional syncope in the setting of his history include medication effect (tramadol + antihypertensives) or pacemaker malfunction. No chest pain or EKG findings to suggest ACS. No hypoxia and patient has been adherent with Apixiban, so low suspicion for PE at this time. Will obtain CBC, BMET, and interrogate his pacemaker.  Pacemaker was interrogated which did not reveal any episodes of bradycardia or irregular rhythms related to syncopal episode. He did have 5 episodes of asymptomatic Vtach since early December.  The patient walked  around the unit a couple times without difficulty. He was advised to refrain from exercise until he saw his cardiologist. Instructions were given and he was discharged in good condition    Ruben ImJeremy Taheem Fricke, MD 04/12/15 1527  Marily MemosJason Mesner, MD 04/13/15 [phone]

## 2015-04-12 NOTE — ED Notes (Signed)
Patient ambulated to the restroom

## 2015-04-12 NOTE — Telephone Encounter (Signed)
[facility] and was sent [facility]. He had a syncope episode and pacemaker was normal. Please have Dr Katrinka BlazingSmith to advise for follow-up. Pt had rt hip replacement 5 weeks ago.

## 2015-04-12 NOTE — ED Notes (Signed)
stATUS postrt hip replacement 5 weeks ago, and [facility]  [facility] and broke out in sweat, was clammy, no  Cp, did get sob did not fall off bike, did not hit head has pacemaker is on blood thinners

## 2015-04-12 NOTE — Discharge Instructions (Signed)
[name], it was a pleasure taking care of you. The most likely cause of your fainting spell is something called "vaso-vagal" syncope. However, given your heart history, we would advise that you refrain from strenuous activity until you've seen your cardiologist. Melina FiddlerWe've checked your pacemaker, and it appears that it is working appropriately.

## 2015-04-12 NOTE — Telephone Encounter (Signed)
My next office day or with APP before then.

## 2015-04-13 NOTE — Telephone Encounter (Signed)
Returned call to pt daughter Dr.Dunkerson. She sts that the pt does not need an appt with Dr.Smith at this time. The pt will be returning home to [place] and will f/u with his cardiologist there. She voiced appreciation for the call back

## 2015-04-14 ENCOUNTER — Ambulatory Visit (INDEPENDENT_AMBULATORY_CARE_PROVIDER_SITE_OTHER): Payer: Self-pay | Admitting: Cardiovascular Disease

## 2015-04-14 ENCOUNTER — Ambulatory Visit (INDEPENDENT_AMBULATORY_CARE_PROVIDER_SITE_OTHER): Payer: Self-pay

## 2015-04-14 ENCOUNTER — Other Ambulatory Visit (INDEPENDENT_AMBULATORY_CARE_PROVIDER_SITE_OTHER): Payer: Self-pay

## 2015-04-16 ENCOUNTER — Other Ambulatory Visit (INDEPENDENT_AMBULATORY_CARE_PROVIDER_SITE_OTHER): Payer: Self-pay

## 2015-04-16 ENCOUNTER — Ambulatory Visit (INDEPENDENT_AMBULATORY_CARE_PROVIDER_SITE_OTHER): Payer: Self-pay

## 2015-05-21 ENCOUNTER — Ambulatory Visit (INDEPENDENT_AMBULATORY_CARE_PROVIDER_SITE_OTHER): Payer: Self-pay

## 2015-06-11 ENCOUNTER — Ambulatory Visit (INDEPENDENT_AMBULATORY_CARE_PROVIDER_SITE_OTHER): Payer: Self-pay | Admitting: Physician Assistant

## 2015-06-11 LAB — VAHRT HISTORIC LVEF: Ejection Fraction: 60 %

## 2015-07-23 ENCOUNTER — Other Ambulatory Visit: Payer: Self-pay

## 2015-07-23 MED ORDER — CICLESONIDE 160 MCG/ACT IN AERS: INHALATION_SPRAY | RESPIRATORY_TRACT | Status: DC

## 2015-07-23 NOTE — Telephone Encounter (Signed)
[facility] [place] [place] [place]

## 2015-10-01 ENCOUNTER — Ambulatory Visit (INDEPENDENT_AMBULATORY_CARE_PROVIDER_SITE_OTHER): Payer: Self-pay

## 2015-12-21 ENCOUNTER — Other Ambulatory Visit (INDEPENDENT_AMBULATORY_CARE_PROVIDER_SITE_OTHER): Payer: Self-pay

## 2015-12-21 ENCOUNTER — Ambulatory Visit (INDEPENDENT_AMBULATORY_CARE_PROVIDER_SITE_OTHER): Payer: Self-pay

## 2016-01-19 ENCOUNTER — Ambulatory Visit (INDEPENDENT_AMBULATORY_CARE_PROVIDER_SITE_OTHER): Payer: Self-pay | Admitting: Cardiology

## 2016-04-20 ENCOUNTER — Other Ambulatory Visit: Payer: Self-pay

## 2016-04-24 ENCOUNTER — Other Ambulatory Visit: Payer: Self-pay | Admitting: Allergy

## 2016-07-21 ENCOUNTER — Ambulatory Visit (INDEPENDENT_AMBULATORY_CARE_PROVIDER_SITE_OTHER): Payer: Self-pay

## 2016-07-28 ENCOUNTER — Other Ambulatory Visit (INDEPENDENT_AMBULATORY_CARE_PROVIDER_SITE_OTHER): Payer: Self-pay

## 2016-07-28 ENCOUNTER — Ambulatory Visit (INDEPENDENT_AMBULATORY_CARE_PROVIDER_SITE_OTHER): Payer: Self-pay

## 2016-10-06 ENCOUNTER — Observation Stay
Admission: EM | Admit: 2016-10-06 | Discharge: 2016-10-07 | Disposition: A | Discharge status: Home / Self Care | Payer: BLUE CROSS/BLUE SHIELD | Attending: Internal Medicine | Admitting: Internal Medicine

## 2016-10-06 ENCOUNTER — Emergency Department: Payer: BLUE CROSS/BLUE SHIELD

## 2016-10-06 DIAGNOSIS — M1611 Unilateral primary osteoarthritis, right hip: Secondary | ICD-10-CM | POA: Insufficient documentation

## 2016-10-06 DIAGNOSIS — E7800 Pure hypercholesterolemia, unspecified: Secondary | ICD-10-CM | POA: Insufficient documentation

## 2016-10-06 DIAGNOSIS — R0602 Shortness of breath: Secondary | ICD-10-CM | POA: Insufficient documentation

## 2016-10-06 DIAGNOSIS — I4891 Unspecified atrial fibrillation: Secondary | ICD-10-CM | POA: Insufficient documentation

## 2016-10-06 DIAGNOSIS — R55 Syncope and collapse: Principal | ICD-10-CM | POA: Diagnosis present

## 2016-10-06 DIAGNOSIS — I10 Essential (primary) hypertension: Secondary | ICD-10-CM | POA: Insufficient documentation

## 2016-10-06 DIAGNOSIS — Z880 Allergy status to penicillin: Secondary | ICD-10-CM | POA: Insufficient documentation

## 2016-10-06 DIAGNOSIS — Z7901 Long term (current) use of anticoagulants: Secondary | ICD-10-CM | POA: Insufficient documentation

## 2016-10-06 DIAGNOSIS — Z950 Presence of cardiac pacemaker: Secondary | ICD-10-CM | POA: Insufficient documentation

## 2016-10-06 DIAGNOSIS — I4892 Unspecified atrial flutter: Secondary | ICD-10-CM | POA: Insufficient documentation

## 2016-10-06 DIAGNOSIS — E785 Hyperlipidemia, unspecified: Secondary | ICD-10-CM | POA: Insufficient documentation

## 2016-10-06 DIAGNOSIS — Z96641 Presence of right artificial hip joint: Secondary | ICD-10-CM | POA: Insufficient documentation

## 2016-10-06 HISTORY — DX: Essential (primary) hypertension: I10

## 2016-10-06 HISTORY — DX: Pure hypercholesterolemia, unspecified: E78.00

## 2016-10-06 HISTORY — DX: Sleep apnea, unspecified: G47.30

## 2016-10-06 HISTORY — DX: Unspecified atrial fibrillation: I48.91

## 2016-10-06 LAB — CBC AND DIFFERENTIAL
Absolute NRBC: 0 10*3/uL
Basophils Absolute Automated: 0.06 10*3/uL (ref 0.00–0.20)
Basophils Automated: 0.6 %
Eosinophils Absolute Automated: 0.3 10*3/uL (ref 0.00–0.70)
Eosinophils Automated: 3.1 %
Hematocrit: 46.9 % (ref 42.0–52.0)
Hgb: 16.1 g/dL (ref 13.0–17.0)
Immature Granulocytes Absolute: 0.02 10*3/uL
Immature Granulocytes: 0.2 %
Lymphocytes Absolute Automated: 1.84 10*3/uL (ref 0.50–4.40)
Lymphocytes Automated: 19 %
MCH: 31.5 pg (ref 28.0–32.0)
MCHC: 34.3 g/dL (ref 32.0–36.0)
MCV: 91.8 fL (ref 80.0–100.0)
MPV: 10.4 fL (ref 9.4–12.3)
Monocytes Absolute Automated: 0.61 10*3/uL (ref 0.00–1.20)
Monocytes: 6.3 %
Neutrophils Absolute: 6.87 10*3/uL (ref 1.80–8.10)
Neutrophils: 70.8 %
Nucleated RBC: 0 /100 WBC (ref 0.0–1.0)
Platelets: 180 10*3/uL (ref 140–400)
RBC: 5.11 10*6/uL (ref 4.70–6.00)
RDW: 13 % (ref 12–15)
WBC: 9.7 10*3/uL (ref 3.50–10.80)

## 2016-10-06 LAB — TROPONIN I: Troponin I: 0.01 ng/mL (ref 0.00–0.09)

## 2016-10-06 LAB — BASIC METABOLIC PANEL
Anion Gap: 11 (ref 5.0–15.0)
BUN: 14 mg/dL (ref 9–28)
CO2: 24 mEq/L (ref 22–29)
Calcium: 9.4 mg/dL (ref 7.9–10.2)
Chloride: 102 mEq/L (ref 100–111)
Creatinine: 1.5 mg/dL — ABNORMAL HIGH (ref 0.7–1.3)
Glucose: 136 mg/dL — ABNORMAL HIGH (ref 70–100)
Potassium: 4.2 mEq/L (ref 3.5–5.1)
Sodium: 137 mEq/L (ref 136–145)

## 2016-10-06 LAB — GFR: EGFR: 54.4

## 2016-10-06 MED ORDER — ACETAMINOPHEN 325 MG PO TABS
650.0000 mg | ORAL_TABLET | Freq: Four times a day (QID) | ORAL | Status: AC | PRN
Start: 2016-10-06 — End: 2016-10-07

## 2016-10-06 MED ORDER — SODIUM CHLORIDE 0.9 % IV BOLUS
500.0000 mL | Freq: Once | INTRAVENOUS | Status: AC
Start: 2016-10-06 — End: 2016-10-06
  Administered 2016-10-06: 500 mL via INTRAVENOUS

## 2016-10-06 MED ORDER — SODIUM CHLORIDE 0.9 % IV BOLUS
500.0000 mL | Freq: Once | INTRAVENOUS | Status: AC
Start: 2016-10-06 — End: 2016-10-06
  Administered 2016-10-06: 20:00:00 500 mL via INTRAVENOUS

## 2016-10-06 NOTE — ED Notes (Signed)
Bed: B23  Expected date:   Expected time:   Means of arrival:   Comments:  Medic 438

## 2016-10-06 NOTE — ED Triage Notes (Signed)
Patient was sitting and started feeling as if he was over heating and faint then doesn't remember anything. Ems states by the time als unit arrived he was awake but not responding and pale and diaphoretic. Then slowly started to respond and finally before arrival was back to normal

## 2016-10-06 NOTE — ED Provider Notes (Signed)
Physician/Midlevel provider first contact with patient: 10/06/16 1732           EMERGENCY DEPARTMENT HISTORY AND PHYSICAL EXAM    Patient Name: [name], [name]  Encounter Date:  10/06/2016  Attending Physician: Netta Neat, MD  Patient DOB:  [birth year]  MRN:  [id number]  Room:  V409/W119-14    History of Presenting Illness     Historian: Patient    82 y.o. male with h/o high cholesterol, HTN, and pacemaker insertion (for bradycardia) BIBA for sudden onset of witnessed syncope from a seated position that occurred just prior to arrival while alone at a restaurant. The pt states that prior to his syncopal episode he felt faint and short of breath. Denies fever, cough, CP, palpitations, abd pain, melena, and hematochezia. He follows-up with his electrophysiologist every 3 months and his last follow-up was about ~3 months ago. His follow-up exam was unremarkable.  No other modifiers.        Past Medical History     Past Medical History:   Diagnosis Date   . High cholesterol    . Hypertension        Past Surgical History     Past Surgical History:   Procedure Laterality Date   . INSERTION, PACEMAKER COMPLETE     . TOTAL HIP ARTHROPLASTY         Family History     History reviewed. No pertinent family history.    Social History     Social History     Social History   . Marital status: Married     Spouse name: N/A   . Number of children: N/A   . Years of education: N/A     Social History Main Topics   . Smoking status: Never Smoker   . Smokeless tobacco: Never Used   . Alcohol use Yes   . Drug use: No   . Sexual activity: Not on file     Other Topics Concern   . Not on file     Social History Narrative   . No narrative on file       Review of Systems     Constitutional:  No fever  Eyes: No discharge   ENT: No ST  CV:  No CP or palpitations  Resp:  No SOB or cough  GI: No abd pain, N, V, D, melena, or hematochezia  GU: No dysuria   Skin: No rash  Neuro: +Syncope. No HA  Psych:  No behavior changes  All other systems reviewed and  negative    Physical Exam     Constitutional: Vital signs reviewed. Well appearing.  Head: Normocephalic, atraumatic  Eyes: Conjunctiva and sclera are normal.  No injection or discharge.  Ears, Nose, Throat:  Normal external examination of the nose and ears.    Neck: Normal range of motion. Trachea midline.  Respiratory/Chest: Clear to auscultation. No respiratory distress.   Cardiovascular: Regular rate and rhythm. No murmurs.   Abdomen:  No rebound or guarding. Soft.  Non-tender.  Upper Extremity:  No edema. No cyanosis.  Lower Extremity:  No edema. No cyanosis.  Skin: Warm and dry. No rash.  Psychiatric:  Normal affect.  Normal insight.  Neuro:  Alert and conversant.  Normal mental status.  Pupils equal round, reactive to light.  EOMI.  Face symmetric.  Speech fluid.  No drift.  Strength 5/5 UE and LE bilaterally.  Normal finger-nose-finger.      ED Medications Administered     ED  Medication Orders     Start Ordered     Status Ordering Provider    10/06/16 1933 10/06/16 1932  sodium chloride 0.9 % bolus 500 mL  Once     Route: Intravenous  Ordered Dose: 500 mL     Last MAR action:  New Bag Tobias Avitabile    10/06/16 1755 10/06/16 1754  sodium chloride 0.9 % bolus 500 mL  Once     Route: Intravenous  Ordered Dose: 500 mL     Last MAR action:  New Bag Kanen Mottola          Orders Placed During This Encounter     Orders Placed This Encounter   Procedures   . Chest AP Portable   . Basic Metabolic Panel   . CBC and differential   . GFR   . Troponin I   . Orthostatic Vital signs   . Cardiac monitoring (ED ONLY)   . Vital Signs-Orthostatic   . ECG 12 lead   . Saline lock IV   . Place (admit) for Observation Services   . Fair Thelma Barge ED Bed Request (Observation)       Diagnostic Study Results     The results of the diagnostic studies below were reviewed by the ED provider:    Labs  Results     Procedure Component Value Units Date/Time    Troponin I [[id number]] Collected:  10/06/16 1806    Specimen:  Blood Updated:  10/06/16  1837     Troponin I 0.01 ng/mL     Basic Metabolic Panel [[id number]]  (Abnormal) Collected:  10/06/16 1806    Specimen:  Blood Updated:  10/06/16 1831     Glucose 136 (H) mg/dL      BUN 14 mg/dL      Creatinine 1.5 (H) mg/dL      Calcium 9.4 mg/dL      Sodium 478 mEq/L      Potassium 4.2 mEq/L      Chloride 102 mEq/L      CO2 24 mEq/L      Anion Gap 11.0    GFR [[id number]] Collected:  10/06/16 1806     Updated:  10/06/16 1831     EGFR 54.4    CBC and differential [[id number]] Collected:  10/06/16 1806    Specimen:  Blood from Blood Updated:  10/06/16 1817     WBC 9.70 x10 3/uL      Hgb 16.1 g/dL      Hematocrit 62.9 %      Platelets 180 x10 3/uL      RBC 5.11 x10 6/uL      MCV 91.8 fL      MCH 31.5 pg      MCHC 34.3 g/dL      RDW 13 %      MPV 10.4 fL      Neutrophils 70.8 %      Lymphocytes Automated 19.0 %      Monocytes 6.3 %      Eosinophils Automated 3.1 %      Basophils Automated 0.6 %      Immature Granulocyte 0.2 %      Nucleated RBC 0.0 /100 WBC      Neutrophils Absolute 6.87 x10 3/uL      Abs Lymph Automated 1.84 x10 3/uL      Abs Mono Automated 0.61 x10 3/uL      Abs Eos Automated 0.30 x10 3/uL      Absolute  Baso Automated 0.06 x10 3/uL      Absolute Immature Granulocyte 0.02 x10 3/uL      Absolute NRBC 0.00 x10 3/uL           Radiologic Studies  Radiology Results (24 Hour)     Procedure Component Value Units Date/Time    Chest AP Portable [[id number]] Collected:  10/06/16 1852    Order Status:  Completed Updated:  10/06/16 1857    Narrative:       Reason for exam: 82 year old male with chest pain.    FINDINGS:  Portable AP view. 1826.  The heart size and contour are normal.  A left pacemaker is in good  position. Lungs are clear except for minimal left basilar opacity  probably from atelectasis. No pleural effusion, hilar or mediastinal  prominence is evident. There is no pneumothorax.      Impression:         Minimal left basilar opacity probably from atelectasis. Remainder as  above.        Wilmon Pali, MD    10/06/2016 6:53 PM          Scribe and MD Attestations     I, Netta Neat, MD, personally performed the services documented. Mark deBettencourt is scribing for me on [place]. I reviewed and confirm the accuracy of the information in this medical record.    I, Mark deBettencourt, am serving as a Neurosurgeon to document services personally performed by Netta Neat, MD, based on the provider's statements to me.     Credentials: Psychiatric nurse, scribe    Rendering Provider: Netta Neat, MD    Monitors, EKG, Critical Care, Vitals and Splints     Cardiac Monitor (interpreted by ED physician):  EKG (interpreted by ED physician): Normal sinus rhythm. Rate is normal at 62 BPM. Normal axis. Normal intervals. No acute ST or T wave changes. Interpreted by ED Physician.    Critical Care:   Splint check:      Patient Vitals for the past 24 hrs:   BP Temp Temp src Pulse Resp SpO2 Weight   10/06/16 2130 159/83 97.8 F (36.6 C) Oral 68 18 100 % -   10/06/16 2055 160/90 - - 75 - - -   10/06/16 2054 156/85 - - 69 - - -   10/06/16 2053 142/81 - - 69 17 96 % -   10/06/16 2002 162/81 97.6 F (36.4 C) Oral 71 (!) 24 98 % -   10/06/16 1856 144/79 - - 69 16 - -   10/06/16 1845 147/79 - - 71 14 98 % -   10/06/16 1734 154/83 - - 69 16 97 % 83 kg         MDM and Clinical Notes     PCP:    Carleene Cooper --> [facility] --> [place] --> [place]    8:00PM - Case discussed with [employer] rep who will see case in the morning. Case also discussed with Dr. Fawn Kirk.    Patient was well-appearing on serial reevaluation at time of disposition.    Diagnostic impression and plan were discussed with the patient and/or family.    Results of lab/radiology tests were discussed with the patient and/or family. All questions were answered and concerns addressed.      Diagnosis and Disposition     Clinical Impression  1. Syncope and collapse          Disposition  ED Disposition     ED Disposition  Condition Date/Time Comment     Observation  Fri Oct 06, 2016  8:03 PM Admitting Physician: Joycelyn Man [[id number]]   Diagnosis: Syncope and collapse [780.2.ICD-9-CM]   Estimated Length of Stay: < 2 midnights   Tentative Discharge Plan?: Home or Self Care [1]   Patient Class: Observation [104]               Isidoro Donning, MD  10/09/16 1026

## 2016-10-07 ENCOUNTER — Observation Stay: Payer: BLUE CROSS/BLUE SHIELD

## 2016-10-07 LAB — COMPREHENSIVE METABOLIC PANEL
ALT: 15 U/L (ref 0–55)
AST (SGOT): 18 U/L (ref 5–34)
Albumin/Globulin Ratio: 1.2 (ref 0.9–2.2)
Albumin: 3.5 g/dL (ref 3.5–5.0)
Alkaline Phosphatase: 104 U/L (ref 38–106)
Anion Gap: 10 (ref 5.0–15.0)
BUN: 13 mg/dL (ref 9–28)
Bilirubin, Total: 0.6 mg/dL (ref 0.2–1.2)
CO2: 22 mEq/L (ref 22–29)
Calcium: 8.7 mg/dL (ref 7.9–10.2)
Chloride: 105 mEq/L (ref 100–111)
Creatinine: 1.3 mg/dL (ref 0.7–1.3)
Globulin: 2.9 g/dL (ref 2.0–3.6)
Glucose: 110 mg/dL — ABNORMAL HIGH (ref 70–100)
Potassium: 4 mEq/L (ref 3.5–5.1)
Protein, Total: 6.4 g/dL (ref 6.0–8.3)
Sodium: 137 mEq/L (ref 136–145)

## 2016-10-07 LAB — TROPONIN I
Troponin I: 0.01 ng/mL (ref 0.00–0.09)
Troponin I: 0.01 ng/mL (ref 0.00–0.09)

## 2016-10-07 LAB — GFR: EGFR: >60

## 2016-10-07 LAB — LIPID PANEL
Cholesterol / HDL Ratio: 4.3
Cholesterol: 111 mg/dL (ref 0–199)
HDL: 26 mg/dL — ABNORMAL LOW (ref 40–9999)
LDL Calculated: 66 mg/dL (ref 0–99)
Triglycerides: 95 mg/dL (ref 34–149)
VLDL Calculated: 19 mg/dL (ref 10–40)

## 2016-10-07 LAB — ECG 12-LEAD
Atrial Rate: 70 {beats}/min
P Axis: -19 degrees
P-R Interval: 186 ms
Q-T Interval: 354 ms
QRS Duration: 86 ms
QTC Calculation (Bezet): 382 ms
R Axis: -41 degrees
T Axis: 105 degrees
Ventricular Rate: 70 {beats}/min

## 2016-10-07 LAB — HEMOLYSIS INDEX: Hemolysis Index: 19 — ABNORMAL HIGH (ref 0–18)

## 2016-10-07 MED ORDER — ALBUTEROL SULFATE HFA 108 (90 BASE) MCG/ACT IN AERS
2.0000 | INHALATION_SPRAY | RESPIRATORY_TRACT | Status: DC | PRN
Start: 2016-10-07 — End: 2016-10-07
  Filled 2016-10-07: qty 1

## 2016-10-07 MED ORDER — LISINOPRIL 20 MG PO TABS
40.0000 mg | ORAL_TABLET | Freq: Every day | ORAL | Status: DC
Start: 2016-10-07 — End: 2016-10-07
  Administered 2016-10-07: 11:00:00 40 mg via ORAL
  Filled 2016-10-07: qty 2

## 2016-10-07 MED ORDER — LISINOPRIL 20 MG PO TABS
40.0000 mg | ORAL_TABLET | Freq: Every day | ORAL | Status: DC
Start: 2016-10-07 — End: 2016-10-07

## 2016-10-07 MED ORDER — SODIUM CHLORIDE 0.45 % IV SOLN
INTRAVENOUS | Status: DC
Start: 2016-10-07 — End: 2016-10-07
  Administered 2016-10-07: 03:00:00 75 mL/h via INTRAVENOUS

## 2016-10-07 MED ORDER — FAMOTIDINE 10 MG/ML IV SOLN (WRAP)
20.0000 mg | Freq: Two times a day (BID) | INTRAVENOUS | Status: DC
Start: 2016-10-07 — End: 2016-10-07

## 2016-10-07 MED ORDER — MONTELUKAST SODIUM 10 MG PO TABS
10.0000 mg | ORAL_TABLET | Freq: Every evening | ORAL | Status: DC
Start: 2016-10-07 — End: 2016-10-07

## 2016-10-07 MED ORDER — ATORVASTATIN CALCIUM 20 MG PO TABS
20.0000 mg | ORAL_TABLET | Freq: Every day | ORAL | Status: DC
Start: 2016-10-07 — End: 2016-10-07
  Administered 2016-10-07: 11:00:00 20 mg via ORAL
  Filled 2016-10-07: qty 1

## 2016-10-07 MED ORDER — NALOXONE HCL 0.4 MG/ML IJ SOLN (WRAP)
0.2000 mg | INTRAMUSCULAR | Status: DC | PRN
Start: 2016-10-07 — End: 2016-10-07

## 2016-10-07 MED ORDER — APIXABAN 5 MG PO TABS
5.0000 mg | ORAL_TABLET | Freq: Two times a day (BID) | ORAL | Status: DC
Start: 2016-10-07 — End: 2016-10-07
  Administered 2016-10-07: 11:00:00 5 mg via ORAL
  Filled 2016-10-07: qty 1

## 2016-10-07 MED ORDER — FAMOTIDINE 20 MG PO TABS
20.0000 mg | ORAL_TABLET | Freq: Two times a day (BID) | ORAL | Status: DC
Start: 2016-10-07 — End: 2016-10-07
  Administered 2016-10-07: 08:00:00 20 mg via ORAL
  Filled 2016-10-07: qty 1

## 2016-10-07 MED ORDER — ONDANSETRON 4 MG PO TBDP
4.0000 mg | ORAL_TABLET | Freq: Four times a day (QID) | ORAL | Status: DC | PRN
Start: 2016-10-07 — End: 2016-10-07

## 2016-10-07 MED ORDER — ENOXAPARIN SODIUM 40 MG/0.4ML SC SOLN
40.0000 mg | Freq: Every day | SUBCUTANEOUS | Status: DC
Start: 2016-10-07 — End: 2016-10-07
  Filled 2016-10-07: qty 0.4

## 2016-10-07 MED ORDER — ONDANSETRON HCL 4 MG/2ML IJ SOLN
4.0000 mg | Freq: Four times a day (QID) | INTRAMUSCULAR | Status: DC | PRN
Start: 2016-10-07 — End: 2016-10-07

## 2016-10-07 MED ORDER — HYDROCHLOROTHIAZIDE 25 MG PO TABS
25.0000 mg | ORAL_TABLET | Freq: Once | ORAL | Status: AC
Start: 2016-10-07 — End: 2016-10-07
  Administered 2016-10-07: 14:00:00 25 mg via ORAL
  Filled 2016-10-07: qty 1

## 2016-10-07 MED ORDER — AMLODIPINE BESYLATE 5 MG PO TABS
10.0000 mg | ORAL_TABLET | Freq: Every day | ORAL | Status: DC
Start: 2016-10-07 — End: 2016-10-07
  Administered 2016-10-07: 08:00:00 10 mg via ORAL
  Filled 2016-10-07: qty 2

## 2016-10-07 NOTE — Discharge Summary (Signed)
DISCHARGE NOTE    Date Time: 10/07/16 12:40 PM  Patient Name: [name],[name]  Attending Physician: Joycelyn Man, MD    Date of Admission:   10/06/2016    Date of Discharge:   10/07/2016    Reason for Admission:   Syncope and collapse [R55]    Problems:   Lists the present on admission hospital problems  Present on Admission:  . Syncope and collapse      Hospital Problems:  Active Problems:    Syncope and collapse      Problem Lists:  Patient Active Problem List   Diagnosis   . Syncope and collapse           Discharge Dx:   1. Active Problems:  2.   Syncope and collapse  3.     Consultations:     Treatment Team: Attending Provider: Joycelyn Man, MD; Registered Nurse: Glenna Fellows, RN; Registered Nurse: Bing Plume, RN; Consulting Physician: Shawnee Knapp, MD; Physical Therapist: Aggie Moats, PT      Procedures performed:               Labs:     Recent Labs  Lab 10/06/16  1806   WBC 9.70   Hgb 16.1   Hematocrit 46.9   MCV 91.8   MCHC 34.3   RDW 13   MPV 10.4   Platelets 180           Recent Labs  Lab 10/07/16  0215 10/06/16  1806   Sodium 137 137   Potassium 4.0 4.2   Chloride 105 102   CO2 22 24   BUN 13 14   Creatinine 1.3 1.5*   Glucose 110* 136*   Calcium 8.7 9.4     Recent Labs  Lab 10/07/16  0215   Bilirubin, Total 0.6   Protein, Total 6.4   Albumin 3.5   ALT 15   AST (SGOT) 18                           Rads:     Radiology Results (24 Hour)     Procedure Component Value Units Date/Time    CT Head WO Contrast [[id number]] Collected:  10/07/16 1101    Order Status:  Completed Updated:  10/07/16 1107    Narrative:       Clinical history: Syncope    Comments: Unenhanced CT scan of the brain was performed.    There is mild age-related cerebral volume loss. There is moderately  extensive nonspecific supratentorial white matter hypodensity, most  likely to be on the basis of chronic small vessel ischemic change. Few  vascular calcifications noted. Calvarium appears intact. Visualized  upper paranasal sinuses  and mastoid air cells are clear.       Impression:        No acute intracranial hemorrhage or mass effect identified.    The following dose reduction techniques were utilized: automated  exposure control and/or adjustment of the mA and/or kV according to  patient size, and the use of iterative reconstruction technique.    Melody Haver, MD   10/07/2016 11:03 AM    Chest AP Portable [[id number]] Collected:  10/06/16 1852    Order Status:  Completed Updated:  10/06/16 1857    Narrative:       Reason for exam: 82 year old male with chest pain.    FINDINGS:  Portable AP view. 1826.  The heart size and contour  are normal.  A left pacemaker is in good  position. Lungs are clear except for minimal left basilar opacity  probably from atelectasis. No pleural effusion, hilar or mediastinal  prominence is evident. There is no pneumothorax.      Impression:         Minimal left basilar opacity probably from atelectasis. Remainder as  above.        Wilmon Pali, MD   10/06/2016 6:53 PM                  Hospital Course:     [name] is a 82 y.o. male with H/O Hypertension, hypercholesterolemia, atrial fibrillation, status post pacemaker implant.  Emergency room because of syncope.  Patient says he was sitting eating in a restaurant all of a sudden he was sweaty, shortness of breath.  He passed out.  When he woke up he was surrounded by o people Denies any headache, chest pain, lightheadedness or dizziness.  Patient is taking Elquis once a  Day , although he was supposed to be twice a day for last 6 weeks  Syncope.The patient was admitted on telemetry.  Neuro checks every 4 hours for stable.  Cardiology was consulted as well as neurology was consulted.  Cardiology recommended pacemaker interrogation.  Patient had pacemaker checked, which was stable.  12  Cardiology recommended long-term anticoagulation as his chads score was 2 and CHADS  vasc score of 3  Patient was seen by a neurologist.  His impression was most likely  vasovagal attack.  As patient had a poor water before his episode once he was cleared by neurology.  Patient was discharged home  Atrial fibrillation, rate controlled.  Patient will continue Elquis 5 mg bid.  Patient has been on once a day elquis 5 mg  Status post pacemaker.  Patient will have pacemaker checked   Hypertension.  Continue his amlodipine and lisinopril      As pt clinically stable will [place] home with Office FU  Admission Condition: Unstable  Discharge Condition: Stable          This note was generated by the Epic EMR system/ Dragon speech recognition and may contain inherent errors or omissions not intended by the user. Grammatical errors, random word insertions, deletions, pronoun errors and incomplete sentences are occasional consequences of this technology due to software limitations. Not all errors are caught or corrected. If there are questions or concerns about the content of this note or information contained within the body of this dictation they should be addressed directly with the author for clarification          Discharge Medications:        Medication List      CONTINUE taking these medications    albuterol 108 (90 Base) MCG/ACT inhaler  Commonly known as:  PROVENTIL HFA;VENTOLIN HFA     atorvastatin 20 MG tablet  Commonly known as:  LIPITOR     ciclesonide 160 MCG/ACT inhaler  Commonly known as:  ALVESCO     ELIQUIS PO     esomeprazole 40 MG capsule  Commonly known as:  NexIUM     lisinopril 40 MG tablet  Commonly known as:  PRINIVIL,ZESTRIL     mometasone 50 MCG/ACT nasal spray  Commonly known as:  NASONEX     montelukast 10 MG tablet  Commonly known as:  SINGULAIR     NORVASC 10 MG tablet  Generic drug:  amLODIPine     olopatadine 0.1 %  ophthalmic solution  Commonly known as:  PATANOL     vitamin D (ergocalciferol) 50000 UNIT Caps  Commonly known as:  DRISDOL            Please see med rec alsoif any medicine changed after this note  Discharge Instructions:   Diet Regular  Activity as  Tolerated          Patient was instructed to follow up with DR Joycelyn Man 1 week      Minutes spent coordinating discharge and reviewing discharge plan >32 minutes        Signed by: Joycelyn Man, MD

## 2016-10-07 NOTE — Consults (Signed)
Service Date: 10/07/2016     Patient Type: V     CONSULTING PHYSICIAN: Shawnee Knapp MD     REFERRING PHYSICIAN: Joycelyn Man MD     CONSULTING SERVICE:  Neurology.     REASON FOR CONSULTATION:  Blackout.     HISTORY OF PRESENT ILLNESS:  The patient is an 82 year old man mentally and physically active, still  works full-time as a Armed forces logistics/support/administrative officer, plays tennis,  who has history of hypertension, hyperlipidemia, atrial fibrillation and  osteoarthritis, no history of heart attacks, cancer or stroke, who comes  into the hospital with a blackout.  The patient has a pacemaker.  The  patient himself provides information, available records are reviewed.     The patient yesterday afternoon was at a restaurant around 4:00, he  finished dinner.  He had 1 glass of beer.  He was sitting on a bar stool at  the counter.  The last thing he remembers is taking a sip of cold water  using a straw.  The next thing he remembers is feeling warm with blurred  vision, lightheaded and passed out.  Other person saw him slumped on the  counter.  The patient was out for a few minutes.  EMS was called.  The  patient denies tongue biting or incontinence.  When he woke up, he felt a  little groggy and was slow but denies confusion.  The patient, prior to  passing out, felt briefly that he was short of breath.  He does not recall  any chest pain or palpitations.  The patient stated he has not had any  blackouts, history of seizures or TIAs.  Recently only he had passed out  prior to the pacemaker being put in.  Currently, he feels well, denies any  dizziness, vision change, focal weakness or numbness.  The patient has been  on Eliquis for atrial fibrillation.  For the last 6 weeks, he cut down the  dose to 1 a day.  The patient currently feels well.  Denies any fever or  chills.     PAST MEDICAL HISTORY:  Hypertension, hyperlipidemia, atrial fibrillation, pacemaker and  osteoarthritis.     PAST SURGICAL HISTORY:  Pacemaker  placed and right hip arthroplasty.     FAMILY HISTORY:  Positive for stroke in father.     SOCIAL HISTORY:  Nonsmoker, no alcohol or drug abuse.  Married, lives with his wife.  The  patient still works full-time.  He also has a home in [place].     PREADMISSION MEDICATIONS:  Amlodipine, Eliquis and Prinivil.     ALLERGIES:  PENICILLIN.     REVIEW OF SYSTEMS:  A 10-point review of systems is negative other than covered above.  No  fever or chills.  No chest pain or palpitations.  Transient short of breath  and warm feeling.  The patient was very diaphoretic when he woke up.  His  undershirt was covered in sweat.     PHYSICAL EXAMINATION:  VITAL SIGNS:  Temperature 97.6, pulse 69, respirations 14, blood pressure  140/90, oxygen saturation 99%.  GENERAL:  A pleasant, cooperative, elderly African-American man lying in  bed in no acute distress.  HEAD:  No external signs of trauma.  NECK:  Supple.  ENT:  Unremarkable.  SKIN:  Warm, dry.  EXTREMITIES:  Without clubbing, cyanosis or edema.  Peripheral pulses  intact.  NEUROLOGIC ASSESSMENT:  Shows patient to be awake, alert, oriented to time,  place, person  and situation.  Speech is clear.  Language is normal.   Extraocular movements full.  Pupil equal, round and reactive.  Face  symmetric.  Tongue midline.  Good strength in arms and legs.  Reflexes  symmetric.  Negative Babinski.  HEART:  Sounds normal, no murmurs, no carotid bruits, no vertebral artery  bruits.  CEREBELLAR:  Exam at the bedside is negative.     IMPRESSION:  An 82 year old with history of hypertension, hyperlipidemia, atrial  fibrillation on Eliquis under dosing himself for the last 6 weeks, history  of osteoarthritis, right hip replacement, comes into the hospital with a  blackout preceded by shortness of breath, warm feeling, blurry vision  associated with diaphoresis without any post-event confusion.  Clinical  impression is that of syncopal episode, likely vasovagal.  The patient is  back to  baseline.  No evidence of stroke.  Symptoms are not suggestive of  transient ischemic attack.  No definite information to support a diagnosis  of seizure.     RECOMMENDATIONS:  Dizziness, fall and blackout precautions.  Check orthostatic vital signs.   Keep patient on telemetry to rule out any arrhythmias.  Pacemaker  interrogation.  Continue Eliquis, resume full dose.  The patient's CT brain  scan is negative for any acute intracranial process, no bleed.  Routine  labs showed unremarkable CBC, normal chemistry panel.  Creatinine 1.3,  random glucose 110.  Cardiology consultation would be reasonable.  The  patient has a pacemaker; therefore, I cannot undergo MRI imaging.  Continue  general medical and supportive care.  DVT and aspiration precautions.  No  indications for any additional emergent neurodiagnostic testing.  The  patient prefers not to wait for a carotid Doppler over this long weekend  and that sounds reasonable.     Diagnosis, recommendation and plan of care discussed with the patient.   Care coordinated with the nursing staff, attending/referring physician.   The patient is medically deemed stable and can be discharged with  outpatient neurology, cardiology, and primary care followup.           D:  10/07/2016 16:22 PM by Dr. Shawnee Knapp, MD ([id number])  T:  10/07/2016 21:33 PM by NTS      (Conf: [id number]) (Doc ID: [id number])

## 2016-10-07 NOTE — Discharge Instructions (Signed)
Near-Fainting: Vagal Reaction  Fainting (syncope) is a temporary loss of consciousness (passing out). It is associated with a loss of postural tone. Postural tone is the constant contraction of the muscles in your body to help keep your body upright. It also helps blood return towards the heart and brain. Syncope occurs when there is reduced blood flow to the brain due to this common vagal reaction. A vagal reaction is a reflex response that causes a sudden drop in your blood pressure, and your pulse to slow down. If the pulse is low enough, the blood pressure falls and causes fainting or near-fainting. Lying down usually stops the reaction very quickly.  These are symptoms of near-fainting:   Feeling lightheaded or like you are going to faint   Weak pulse   Nausea   Sweating   Blurred vision or feeling like your vision is "blacking out"   Palpitations   Chest pain   Trouble breathing   Cool and clammy skin  Causes for near-fainting include:   Sudden emotional stress like fear, pain, panic, sight of blood   Straining or overexertion, straining while using the toilet, coughing, sneezing   Standing up too quickly, or standing up for too long a time   Pregnancy  Home care  The following will help you care for yourself at home:   Rest today and go back to your normal activities as soon as you are feeling back to normal.   If you become light-headed or dizzy, lie down right away or sit with your head lowered between your knees.   Stay hydrated and do not skip meals.   Avoid prolonged standing and hot places   Do what you can to prevent constipation. If you bear down excessively when trying to have a bowel movement, this can trigger a vagal response  There may be other causes for a vagal response and near-syncope. For example, this can happen after open-heart surgery when the heart muscle is inflamed and irritated.  Check with your doctor to see if there is testing you need such as a tilt-table test,  heart rhythm monitoring, or blood tests. Review the medicines you take with your healthcare provider and pharmacist to be sure the symptoms you have are not a side effect of a medicine.  Follow-up care  Follow up with your healthcare provider, or as advised.  If you are having frequent episodes of near-syncope or vagal reactions, be cautious about activities such as driving that could harm yourself or others if you were to faint. Do not drive or operate heavy machinery if you are feeling like you may faint.  Call 911  Call 911 if any of these occur:   Another fainting spell occurs, and it is not explained by the common causes listed above   Fainting or loss of consciousness   Chest, arm, neck, jaw, back, or abdominal pain   Shortness of breath   Weakness, tingling, or numbness in one side of the face, one arm or leg   Slurred speech, confusion, trouble walking or seeing   Seizure   Blood in vomit or stools (black or red color)  When to seek medical advice  Call your healthcare provider right away if you have occasional mild lightheadedness, especially when standing up.  Date Last Reviewed: 07/08/2014   2000-2016 [employer] [address], [place] [address]. All rights reserved. This information is not intended as a substitute for professional medical care. Always follow your   healthcare professional's instructions.

## 2016-10-07 NOTE — Plan of Care (Signed)
Problem: Safety  Goal: Patient will be free from injury during hospitalization  Outcome: Progressing   10/07/16 0150   Goal/Interventions addressed this shift   Patient will be free from injury during hospitalization  Assess patient's risk for falls and implement fall prevention plan of care per policy;Provide and maintain safe environment;Ensure appropriate safety devices are available at the bedside;Hourly rounding;Assess for patients risk for elopement and implement Elopement Risk Plan per policy   Admitted to floor in good condition. Stable. Does not remember much of episode until arriving to ED. Oriented to CL and unit routines. Pt tired and wants to sleep.  A&O X4, VSS, NSR per monitor w/ no pacer spikes. Is at high risk for falls 2* pre-syncopal episode. Bed in low and locked position, siderails up X2, CL in reach, bed alarm in use. Frequent visual checks, currently resting comfortably.

## 2016-10-07 NOTE — Plan of Care (Signed)
Problem: Safety  Goal: Patient will be free from injury during hospitalization  Outcome: Progressing   10/07/16 1439   Goal/Interventions addressed this shift   Patient will be free from injury during hospitalization  Assess patient's risk for falls and implement fall prevention plan of care per policy;Provide and maintain safe environment;Use appropriate transfer methods;Ensure appropriate safety devices are available at the bedside;Include patient/ family/ care giver in decisions related to safety;Hourly rounding

## 2016-10-07 NOTE — Progress Notes (Addendum)
BP remains elevated, 167/92.  Pt states he normally runs 140s-150 systolic.  Pt was recently taken off 25mg  HCTZ by cardiology.  Pt received his regular antihypertensives (amlodipine and lisinopril) today.  D/w Dr. Fawn Kirk who asks RN to d/w cardiology.    Update:  Per cardiology, give one dose 25mg  HCTZ prior to discharge and have pt resume his home dose of HCTZ, which the pt had recently taken himself off of.  Will given one time dose and advise pt of instructions at discharge.

## 2016-10-07 NOTE — PT Eval Note (Signed)
[facility]  Physical Therapy Evaluation    Patient: [name] MRN: [id number]   Unit: T4C TELEMETRY    Bed: J478/G956-21    Medicare ID # - Medicare Sub. Num: [id number]     Recommendation  Discharge Recommendation: Home with supervision  DME Recommended for Discharge:  (none)  PT Frequency: one time visit    Recommended mode of transportation at discharge:  Car with wife  PMP - Progressive Mobility Protocol   PMP Activity: Step 7 - Walks out of Room  Distance Walked (ft) (Step 6,7): 300 Feet      Plan:     Progress: Discontinue PT Pt is at baseline for mobility, independent without AD.    Interdisciplinary Communication:   Pt left up in bed, wife present, with alarm activated.  Updated white communication board in room with patient's current mobility status. Communicated with RN verbally regarding pt is independent with all mobility, no further PT needs.      Education:   Educated patient/wife to role of physical therapy, plan of care, transfer training techniques, falls prevention, gait training techniques, home safety.      Patient demonstrated understanding.      Evaluation:   Consult received for [name] for PT evaluation and treatment.  Chart reviewed.  Patient's medical condition is appropriate for Physical Therapy intervention at this time.     Medical Diagnosis: Syncope and collapse [R55]    Therapy Diagnosis: difficulty walking       History of Present Illness: [name] is a 82 y.o. male admitted on 10/06/2016 with syncope      Patient Active Problem List   Diagnosis   . Syncope and collapse     Past Medical History:   Diagnosis Date   . A-fib    . High cholesterol    . Hypertension    . Sleep apnea      Past Surgical History:   Procedure Laterality Date   . INSERTION, PACEMAKER COMPLETE     . TOTAL HIP ARTHROPLASTY       Prior Level of Function  Prior level of function: Ambulates independently;Independent with ADLs  Baseline Activity Level: Community ambulation  Driving:  independent    Home Living Arrangements  Living Arrangements: Spouse/significant other  Type of Home: House  Home Layout: Multi-level    Subjective: Patient is agreeable to participation in the therapy session. Nursing clears patient for therapy.      Objective:  Patient is in bed with telemetry in place.    Observation of patient/vitals   BP (!) 177/95   Pulse 71   Temp 97.7 F (36.5 C) (Oral)   Resp 16   Ht 1.753 m (5\' 9" )   Wt 87.6 kg (193 lb 3.2 oz)   SpO2 99%   BMI 28.53 kg/m      Cognition/Neuro Status  Orientation Level: Oriented X4  Following Commands: Follows all commands and directions without difficulty  Coordination: intact    Musculoskeletal Examination  Gross ROM  Right Upper Extremity ROM: within functional limits  Left Upper Extremity ROM: within functional limits  Right Lower Extremity ROM: within functional limits  Left Lower Extremity ROM: within functional limits     Tone  Tone: within functional limits    Gross Strength  Right Upper Extremity Strength: 4+/5  Left Upper Extremity Strength: 4+/5  Right Lower Extremity Strength: 4+/5  Left Lower Extremity Strength: 4+/5     Sensation: intact    Functional Mobility  Rolling: Independent  Supine to Sit: Independent  Scooting to EOB: Independent  Sit to Supine: Independent  Sit to Stand: Independent  Stand to Sit: Independent      Transfers  Bed to Chair: Independent  Chair to Bed: Independent  Device Used for Functional Transfer:  (None)  Balance  Balance: within functional limits    AM-PAC Basic Mobility  Turning Over in Bed: None  Sitting Down On/Standing From Armchair: None  Lying on Back to Sitting on Side of Bed: None  Assist Moving to/from Bed to Chair: None  Assist to Walk in Hospital Room: None  Assist to Climb 3-5 Steps with Railing: None  PT Basic Mobility Raw Score: 24    Locomotion  Ambulation: Independent  Stair Management: Modified Independent;one rail R  Number of Stairs: 4       Participation and Endurance  Participation  Effort: excellent       Assessment:   Examination - Assessment: Appears to be at baseline for mobility.   Standardized tests and exams incorporated into evaluation include AMPAC mobility.    Co-morbidities/Patient factors affecting plan of care -  There are a few comorbidities or other factors that affect plan of care and require modification of task including: has stairs to manage, home alone for a portion of the day and work demands.    Clinical factors affecting plan of care -  Pt demonstrates a stable clinical presentation.     G codes  Based on AM-PAC Current: Mobility, Current Status (Z6109): 0 percent impaired, limited or restricted       Goal: Mobility, Goal Status (U0454): 0 percent impaired, limited or restricted       Discharge: Mobility, D/C Status (U9811): 0 percent impaired, limited or restricted    Time Calculation  PT Received On: 10/07/16  Start Time: 1132  Stop Time: 1155  Time Calculation (min): 23 min    Signature:  Aggie Moats DPT

## 2016-10-07 NOTE — Plan of Care (Signed)
Pt cleared by cardiology and neurology for discharge.  Final diagnosis was related to vagal response, not TIA/stroke, per neurology.  Pt does have many risk factors for stroke, and RN did review stroke related education, including risk factors, importance of taking medications as prescribed, healthy lifestyle, and low sodium, low saturated fat diet.  Pt verbalizes understanding of teaching, states he tends to eat healthy and plays tennis.  Reviewed signs/sx of stroke.  Copy of AVS provided to and reviewed with pt and spouse at bedside.  Reviewed all information including follow up instructions, med rec list, and education about vasovagal response.  Reviewed signs/sx to return to ED for.  PIV and tele removed.  Pt discharged home, wheelchair transport off unit.

## 2016-10-07 NOTE — Progress Notes (Signed)
Patient Admitted [id number]  Current Assessed Risk Factors:   Non-modifiable (age, gender, race, family history, previous stroke or TIA): age, gender, race   Modifiable (smoking, obesity, alcohol, illegal drug use): n/a   Modifiable with Doctor's help (HTN, DM, A-fib, high chol, sleep apnea):  HTN, afib, high cholesterol, sleep apnea   Doctor prescribed: lipitor, eliquis, lisinopril, amlodipine  Patient/Family Stroke Prevention Goals: (list stated lifestyle changes/goals) f/u with PCP to discuss resuming CPAP  Education Materials Given To: patient and spouse  Response To Teaching As Evidenced By: (summarize patient/family response) verbalizes understandin  Goals For Today (imaging, aspiration precautions, q 4hr neuro checks, education): SLP/PT/OT, q4h neuro, reinforce education      Sleep Apnea:   Educated patient/family about obstructive sleep apnea (OSA), prevalence and that evidence shows sleep apnea increases the risk of ischemic stroke: yes, pt has CPAP at home but no longer uses  Sample answers: yes or patient previously tested or on CPAP  Other individual risk factors that contribute to OSA: arrythmia  Answers will include this patient's risk factors: (snoring, HTN, stroke, DM, cardiovascular disease, arrhythmia)  Educated patient/family to discuss sleep study with PCP (needs prescription): yes  Sample answers: yes, reinforced or already discussed this visit

## 2016-10-07 NOTE — Consults (Signed)
[facility] CARDIOLOGY CONSULTATION REPORT  [facility]    Date Time: 10/07/16 10:08 AMPatient Name: [id number],UEAVWU  Requesting Physician: Joycelyn Man, MD       Reason for Consultation:   Syncope      History:   [name] is a 82 y.o. male admitted on 10/06/2016.  We have been asked by Joycelyn Man, MD,  to provide cardiac consultation, regarding syncope.  82 year old gentleman who continues to work at the [employer] and has a history of atrial flutter with long pauses.  He was cardioverted and underwent a pacemaker implant in April 2015.  Since then he has done extremely well.  He has also undergone a hip replacement at [facility] in [place], [place] in January of this year and had a spectacular result.  He presents now after syncopal event. He was sitting and then felt warm and flushed and passed out.When he came too he was be wheeled into the ambulance. He was coherent. Denied CP or palps. No evidence of seizure activity.  Of note, pt states that he has only been taking his ELIQUIS ONCE per day instead of BID for the last 6 weeks.      Past Medical History:     Past Medical History:   Diagnosis Date   . High cholesterol    . Hypertension        Past Surgical History:     Past Surgical History:   Procedure Laterality Date   . INSERTION, PACEMAKER COMPLETE     . TOTAL HIP ARTHROPLASTY         Family History:   History reviewed. No pertinent family history.    Social History:     Social History     Social History   . Marital status: Married     Spouse name: N/A   . Number of children: N/A   . Years of education: N/A     Social History Main Topics   . Smoking status: Never Smoker   . Smokeless tobacco: Never Used   . Alcohol use Yes   . Drug use: No   . Sexual activity: Not on file     Other Topics Concern   . Not on file     Social History Narrative   . No narrative on file       Allergies:     Allergies   Allergen Reactions   . Penicillins         Medications:     Prescriptions Prior to Admission   Medication Sig   . amLODIPine (NORVASC) 10 MG tablet Take 10 mg by mouth daily.   Marland Kitchen Apixaban (ELIQUIS PO) Take by mouth.   Marland Kitchen lisinopril (PRINIVIL,ZESTRIL) 40 MG tablet Take 40 mg by mouth daily.       Current Facility-Administered Medications   Medication Dose Route Frequency Provider Last Rate Last Dose   . 0.45% NaCl infusion   Intravenous Continuous Munis, Raiqa, MD 75 mL/hr at 10/07/16 0245 75 mL/hr at 10/07/16 0245   . amLODIPine (NORVASC) tablet 10 mg  10 mg Oral Daily Munis, Raiqa, MD   10 mg at 10/07/16 0807   . enoxaparin (LOVENOX) syringe 40 mg  40 mg Subcutaneous Daily Munis, Raiqa, MD       . famotidine (PEPCID) tablet 20 mg  20 mg Oral Q12H SCH Munis, Raiqa, MD   20 mg at 10/07/16 9811    Or   . famotidine (PEPCID) injection  20 mg  20 mg Intravenous Q12H SCH Munis, Raiqa, MD       . naloxone (NARCAN) injection 0.2 mg  0.2 mg Intravenous PRN Munis, Raiqa, MD       . ondansetron (ZOFRAN-ODT) disintegrating tablet 4 mg  4 mg Oral Q6H PRN Munis, Raiqa, MD        Or   . ondansetron (ZOFRAN) injection 4 mg  4 mg Intravenous Q6H PRN Munis, Raiqa, MD             Review of Systems:    Comprehensive review of systems including constitutional, eyes, ears, nose, mouth, throat, cardiovascular, GI, GU, musculoskeletal, integumentary, respiratory, neurologic, psychiatric, and endocrine is negative other than what is mentioned already in the history of present illness    Physical Exam:     Vitals:    10/07/16 0802   BP: (!) 148/92   Pulse: 70   Resp: 14   Temp: 97.6 F (36.4 C)   SpO2: 97%     Temp (24hrs), Avg:98 F (36.7 C), Min:97.6 F (36.4 C), Max:98.6 F (37 C)      Intake and Output Summary (Last 24 hours) at Date Time  No intake or output data in the 24 hours ending 10/07/16 1008    GENERAL: Patient is in no acute distress   HEENT: No scleral icterus or conjunctival pallor, moist mucous membranes   NECK: No jugular venous distention or thyromegaly, normal  carotid upstrokes without bruits   CARDIAC: Normal apical impulse, regular rate and rhythm, with normal S1 and S2, and no murmurs, rubs, or gallops   CHEST: Clear to auscultation bilaterally, normal respiratory effort  ABDOMEN: No abdominal bruits, masses, or hepatosplenomegaly, nontender, non-distended, good bowel sounds   EXTREMITIES: No clubbing, cyanosis, or edema, 2+ DP, PT, and femoral pulses bilaterally without bruits  SKIN: No rash or jaundice   NEUROLOGIC: Alert and oriented to time, place and person, normal mood and affect  MUSCULOSKELETAL: Normal muscle strength and tone.      Labs Reviewed:       Recent Labs  Lab 10/07/16  0728 10/07/16  0215 10/06/16  1806   Troponin I 0.01 0.01 0.01               Recent Labs  Lab 10/07/16  0215   Bilirubin, Total 0.6   Protein, Total 6.4   Albumin 3.5   ALT 15   AST (SGOT) 18               Recent Labs  Lab 10/06/16  1806   WBC 9.70   Hgb 16.1   Hematocrit 46.9   Platelets 180       Recent Labs  Lab 10/07/16  0215 10/06/16  1806   Sodium 137 137   Potassium 4.0 4.2   Chloride 105 102   CO2 22 24   BUN 13 14   Creatinine 1.3 1.5*   EGFR >60.0 54.4   Glucose 110* 136*   Calcium 8.7 9.4         Radiology   Radiological Procedure reviewed.      chest X-ray  Assessment:    Syncope   Hypertension     Paroxysmal atrial flutter s/p PPM    Longterm chronic anticoagulation CHADS2 score of 2 and a CHA2DS2-VASc score of 3   HLD    Recommendations:    PPM interrogation.   Neurologic workup. If no evidence of bleed or CVA restart OAC with Eliquis at  BID.   Carotids.   Will follow.            Signed by: Debbora Presto, MD    [facility]  NP Spectralink [phone] (8am-5pm)  MD Spectralink [phone] (8am-5pm)  After hours, non urgent consult line [phone]  After Hours, urgent consults [phone]

## 2016-10-07 NOTE — H&P (Signed)
ADMISSION HISTORY AND PHYSICAL EXAM    Date Time: 10/07/16 9:52 AM  Patient Name: [name],[name]  Attending Physician: Joycelyn Man, MD      JW:[id number]    Assessment/ Plan:   Syncope. Will admit pt on tele, neuro checks, cardio, neuro opinion.  Will get CT head without contrast, carotid Doppler.  Further plan as per neurology and cardiology recommendation  Atrial fibrillation, rate controlled.  Patient will continue Elquis 5 mg bid.  Patient has been in and elquis 5 mg daily  Status post pacemaker.  Patient will have pacemaker checked   Hypertension.  Continue his amlodipine and lisinopril    Diagnosis, recommendations and plan of care discussed with the patient.   Care coordinated with the nursing staff . Plan is to follow patient clinically. Additional plan will depend upon the clinical course and and consultant recommendation  This note was generated by the Epic EMR system/ Dragon speech recognition and may contain inherent errors or omissions not intended by the user. Grammatical errors, random word insertions, deletions, pronoun errors and incomplete sentences are occasional consequences of this technology due to software limitations. Not all errors are caught or corrected. If there are questions or concerns about the content of this note or information contained within the body of this dictation they should be addressed directly with the author for clarification    History of Present Illness:   History taken from pt and ER  Physician Hattie Perch    [name] is a 82 y.o. male with H/O Hypertension, hypercholesterolemia, atrial fibrillation, status post pacemaker implant.  Emergency room because of syncope.  Patient says he was sitting eating in a restaurant all of a sudden he was sweaty, shortness of breath.  He passed out.  When he woke up he was surrounded by o people Denies any headache, chest pain, lightheadedness or dizziness.  Patient is taking Elquis once a  Day , although he was supposed to be twice a day  for last 6 weeks        Review of Systems:     A comprehensive review of systems was obtained from chart review and the patient   General : Denies - fever or chills fatigue , lightheadedness.or dizziness blurry vision  ENT denies -nasal congestion or tinnitus, no sore throat, no ear ache   Respiratory : no cough, shortness of breath, or wheezing   Cardiovascular : no chest pain .palpitaion, edema or dyspnea on exertion   Gastrointestinal  no abdominal pain, change in bowel habits, or black or bloody stools no nausea vomiting  Neurological  denies  bowel and bladder control changes, headaches, impaired coordination/balance or numbness/tingling   GU ,no frequency , urgency ,or burning micturation.  Psychiatry.  No anxiety, depression,  Skin no rash  Endocrinology denies polyuria, polydipsia  Lymphatic denies swollen glands      Past Medical History:     Past Medical History:   Diagnosis Date   . High cholesterol    . Hypertension        Past Surgical History:     Past Surgical History:   Procedure Laterality Date   . INSERTION, PACEMAKER COMPLETE     . TOTAL HIP ARTHROPLASTY             Medications:     Prescriptions Prior to Admission   Medication Sig   . amLODIPine (NORVASC) 10 MG tablet Take 10 mg by mouth daily.   Marland Kitchen Apixaban (ELIQUIS PO) Take by mouth.   Marland Kitchen  lisinopril (PRINIVIL,ZESTRIL) 40 MG tablet Take 40 mg by mouth daily.         Allergies:     Allergies   Allergen Reactions   . Penicillins          Social History:     Social History     Social History   . Marital status: Married     Spouse name: N/A   . Number of children: N/A   . Years of education: N/A     Social History Main Topics   . Smoking status: Never Smoker   . Smokeless tobacco: Never Used   . Alcohol use Yes   . Drug use: No   . Sexual activity: Not on file     Other Topics Concern   . Not on file     Social History Narrative   . No narrative on file         Family History:   History reviewed. No pertinent family history.                Physical  Exam:     Vitals:    10/07/16 0802   BP: (!) 148/92   Pulse: 70   Resp: 14   Temp: 97.6 F (36.4 C)   SpO2: 97%       Intake and Output Summary (Last 24 hours) at Date Time  No intake or output data in the 24 hours ending 10/07/16 0952      GENERAL: Patient is in no acute distress   HEENT:Head normocephalic, atraumatic,pupil equal,reacting to light  No scleral icterus or conjunctival pallor, moist mucous membranes   NECK: No jugular venous distention or thyromegaly, normal carotid upstrokes without bruits   CARDIAC: Normal apical impulse, reg rate and rhythm, with normal S1 and S2, and no murmurs, rubs, or gallops   CHEST: Bilaterally symmetrical, equal in expansionClear to auscultation bilaterally, normal respiratory effort   ABDOMEN: Soft ,nontender, No distention,, masses, or hepatosplenomegaly , good bowel sounds   EXTREMITIES: No clubbing, cyanosis, : No Edema; Peripheral pulses intact; Cap refill normal  SKIN: No rash or jaundice   NEUROLOGIC: Alert and oriented to time, place and person, normal mood and affect. No focal motor sensory deficit present.   MUSCULOSKELETAL: Normal muscle strength and tone.     Labs:     Results     Procedure Component Value Units Date/Time    Troponin I [[id number]] Collected:  10/07/16 0728    Specimen:  Blood Updated:  10/07/16 0800     Troponin I 0.01 ng/mL     Troponin I [[id number]] Collected:  10/07/16 0215    Specimen:  Blood Updated:  10/07/16 0254     Troponin I 0.01 ng/mL     Comprehensive metabolic panel [[id number]]  (Abnormal) Collected:  10/07/16 0215    Specimen:  Blood Updated:  10/07/16 0249     Glucose 110 (H) mg/dL      BUN 13 mg/dL      Creatinine 1.3 mg/dL      Sodium 628 mEq/L      Potassium 4.0 mEq/L      Chloride 105 mEq/L      CO2 22 mEq/L      Calcium 8.7 mg/dL      Protein, Total 6.4 g/dL      Albumin 3.5 g/dL      AST (SGOT) 18 U/L      ALT 15 U/L      Alkaline Phosphatase 104  U/L      Bilirubin, Total 0.6 mg/dL      Globulin 2.9 g/dL       Albumin/Globulin Ratio 1.2     Anion Gap 10.0    GFR [[id number]] Collected:  10/07/16 0215     Updated:  10/07/16 0249     EGFR >60.0    Troponin I [[id number]] Collected:  10/06/16 1806    Specimen:  Blood Updated:  10/06/16 1837     Troponin I 0.01 ng/mL     Basic Metabolic Panel [[id number]]  (Abnormal) Collected:  10/06/16 1806    Specimen:  Blood Updated:  10/06/16 1831     Glucose 136 (H) mg/dL      BUN 14 mg/dL      Creatinine 1.5 (H) mg/dL      Calcium 9.4 mg/dL      Sodium 308 mEq/L      Potassium 4.2 mEq/L      Chloride 102 mEq/L      CO2 24 mEq/L      Anion Gap 11.0    GFR [[id number]] Collected:  10/06/16 1806     Updated:  10/06/16 1831     EGFR 54.4    CBC and differential [[id number]] Collected:  10/06/16 1806    Specimen:  Blood from Blood Updated:  10/06/16 1817     WBC 9.70 x10 3/uL      Hgb 16.1 g/dL      Hematocrit 24.4 %      Platelets 180 x10 3/uL      RBC 5.11 x10 6/uL      MCV 91.8 fL      MCH 31.5 pg      MCHC 34.3 g/dL      RDW 13 %      MPV 10.4 fL      Neutrophils 70.8 %      Lymphocytes Automated 19.0 %      Monocytes 6.3 %      Eosinophils Automated 3.1 %      Basophils Automated 0.6 %      Immature Granulocyte 0.2 %      Nucleated RBC 0.0 /100 WBC      Neutrophils Absolute 6.87 x10 3/uL      Abs Lymph Automated 1.84 x10 3/uL      Abs Mono Automated 0.61 x10 3/uL      Abs Eos Automated 0.30 x10 3/uL      Absolute Baso Automated 0.06 x10 3/uL      Absolute Immature Granulocyte 0.02 x10 3/uL      Absolute NRBC 0.00 x10 3/uL               Rads:   Radiological Procedure reviewed.  Radiology Results (24 Hour)     Procedure Component Value Units Date/Time    Chest AP Portable [[id number]] Collected:  10/06/16 1852    Order Status:  Completed Updated:  10/06/16 1857    Narrative:       Reason for exam: 82 year old male with chest pain.    FINDINGS:  Portable AP view. 1826.  The heart size and contour are normal.  A left pacemaker is in good  position. Lungs are clear except for minimal left basilar  opacity  probably from atelectasis. No pleural effusion, hilar or mediastinal  prominence is evident. There is no pneumothorax.      Impression:         Minimal left basilar opacity probably from atelectasis. Remainder as  above.  Wilmon Pali, MD   10/06/2016 6:53 PM          Signed by: Joycelyn Man

## 2016-10-07 NOTE — Discharge Instr - AVS First Page (Addendum)
Reason for your Hospital Admission:  Syncope due to vasovagal      Instructions for after your discharge:  Follow up with cardiology and primary care doctor

## 2016-10-07 NOTE — Progress Notes (Addendum)
Per Dr. Fawn Kirk pt is no longer on stroke pathway, episode was vasovagal.    MD notified of recent BP 177/95.  MD states pt may be discharged, no need for carotid doppler.

## 2016-10-18 ENCOUNTER — Ambulatory Visit (INDEPENDENT_AMBULATORY_CARE_PROVIDER_SITE_OTHER): Payer: Self-pay

## 2016-10-18 ENCOUNTER — Other Ambulatory Visit (INDEPENDENT_AMBULATORY_CARE_PROVIDER_SITE_OTHER): Payer: Self-pay

## 2016-10-18 LAB — VAHRT HISTORIC LVEF: Ejection Fraction: 60 %

## 2017-01-30 IMAGING — DX DG PORTABLE PELVIS
1 series · 1 of 1 positions shown · non-contrast
Comparison: None.

CLINICAL DATA: Postop right anterior hip arthroplasty.

EXAM:
PORTABLE PELVIS 1-2 VIEWS

[pelvis ap]
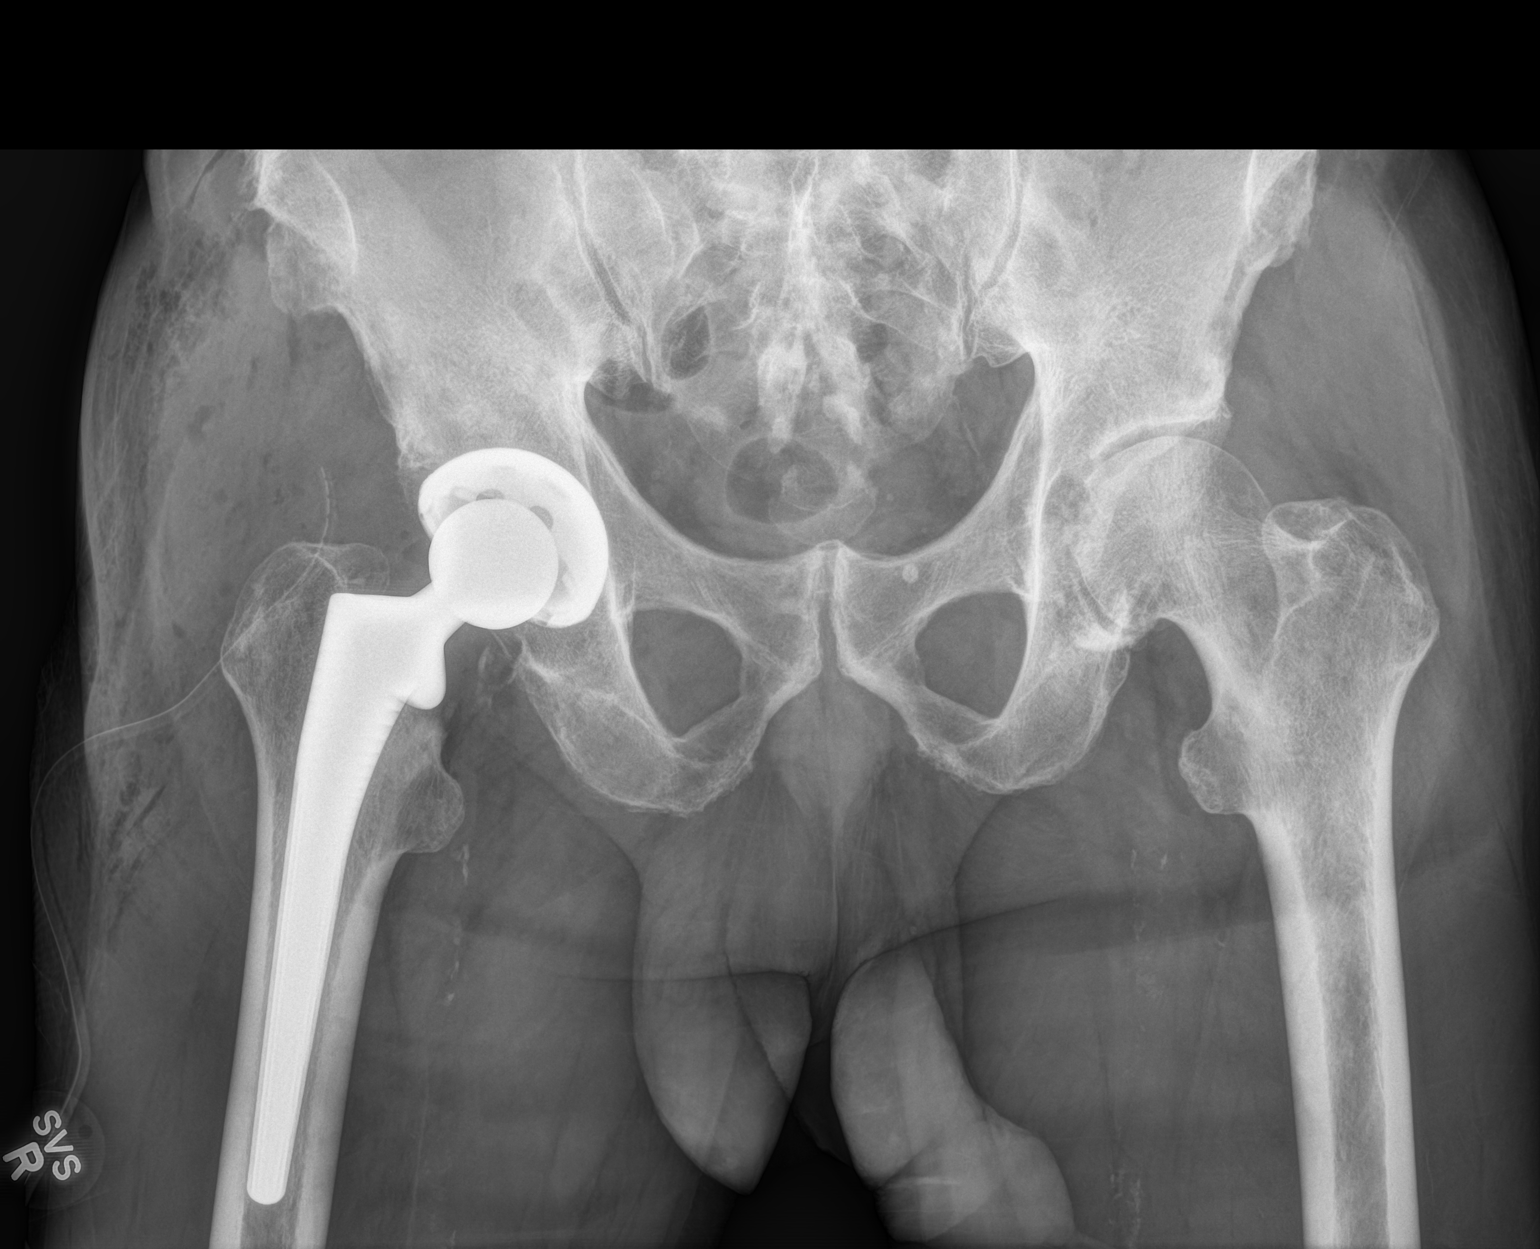

[1 of 1 positions shown; findings below may reference images not displayed]

FINDINGS: Single AP view demonstrates recent right total hip arthroplasty. A
surgical drain is in place. The hardware is well positioned. There
is no evidence of acute fracture or dislocation. There is some gas
within the soft tissues surrounding the right hip. Vascular
calcifications are noted.
IMPRESSION: No demonstrated complication following right total hip arthroplasty.

## 2017-04-13 ENCOUNTER — Ambulatory Visit (INDEPENDENT_AMBULATORY_CARE_PROVIDER_SITE_OTHER): Payer: Self-pay | Admitting: Cardiology

## 2017-06-15 ENCOUNTER — Emergency Department (HOSPITAL_COMMUNITY): Payer: Federal, State, Local not specified - PPO

## 2017-06-15 ENCOUNTER — Emergency Department (HOSPITAL_COMMUNITY)
Admission: EM | Admit: 2017-06-15 | Discharge: 2017-06-16 | Disposition: A | Discharge status: Home / Self Care | Payer: Federal, State, Local not specified - PPO | Attending: Emergency Medicine | Admitting: Emergency Medicine

## 2017-06-15 ENCOUNTER — Encounter (HOSPITAL_COMMUNITY): Payer: Self-pay

## 2017-06-15 ENCOUNTER — Other Ambulatory Visit: Payer: Self-pay

## 2017-06-15 DIAGNOSIS — Z79899 Other long term (current) drug therapy: Secondary | ICD-10-CM | POA: Insufficient documentation

## 2017-06-15 DIAGNOSIS — Z96641 Presence of right artificial hip joint: Secondary | ICD-10-CM | POA: Insufficient documentation

## 2017-06-15 DIAGNOSIS — R404 Transient alteration of awareness: Secondary | ICD-10-CM | POA: Diagnosis not present

## 2017-06-15 DIAGNOSIS — N179 Acute kidney failure, unspecified: Secondary | ICD-10-CM | POA: Diagnosis not present

## 2017-06-15 DIAGNOSIS — I10 Essential (primary) hypertension: Secondary | ICD-10-CM | POA: Diagnosis not present

## 2017-06-15 DIAGNOSIS — J45909 Unspecified asthma, uncomplicated: Secondary | ICD-10-CM | POA: Diagnosis not present

## 2017-06-15 DIAGNOSIS — E860 Dehydration: Secondary | ICD-10-CM | POA: Insufficient documentation

## 2017-06-15 DIAGNOSIS — R55 Syncope and collapse: Secondary | ICD-10-CM

## 2017-06-15 LAB — BASIC METABOLIC PANEL
ANION GAP: 6 (ref 5–15)
Anion gap: 8 (ref 5–15)
BUN: 16 mg/dL (ref 6–20)
BUN: 15 mg/dL (ref 6–20)
CALCIUM: 8.2 mg/dL — AB (ref 8.9–10.3)
CALCIUM: 7.6 mg/dL — AB (ref 8.9–10.3)
CO2: 26 mmol/L (ref 22–32)
CO2: 25 mmol/L (ref 22–32)
CREATININE: 1.28 mg/dL — AB (ref 0.61–1.24)
Chloride: 101 mmol/L (ref 101–111)
Chloride: 105 mmol/L (ref 101–111)
Creatinine, Ser: 1.47 mg/dL — ABNORMAL HIGH (ref 0.61–1.24)
GFR, EST AFRICAN AMERICAN: 50 mL/min — AB (ref >60)
GFR, EST AFRICAN AMERICAN: 59 mL/min — AB (ref >60)
GFR, EST NON AFRICAN AMERICAN: 43 mL/min — AB (ref >60)
GFR, EST NON AFRICAN AMERICAN: 51 mL/min — AB (ref >60)
Glucose, Bld: 114 mg/dL — ABNORMAL HIGH (ref 65–99)
Glucose, Bld: 108 mg/dL — ABNORMAL HIGH (ref 65–99)
POTASSIUM: 3.9 mmol/L (ref 3.5–5.1)
Potassium: 4.2 mmol/L (ref 3.5–5.1)
SODIUM: 136 mmol/L (ref 135–145)
Sodium: 135 mmol/L (ref 135–145)

## 2017-06-15 LAB — CBC
HEMATOCRIT: 43.3 % (ref 39.0–52.0)
HEMOGLOBIN: 15.2 g/dL (ref 13.0–17.0)
MCH: 33.6 pg (ref 26.0–34.0)
MCHC: 35.1 g/dL (ref 30.0–36.0)
MCV: 95.8 fL (ref 78.0–100.0)
Platelets: 170 10*3/uL (ref 150–400)
RBC: 4.52 MIL/uL (ref 4.22–5.81)
RDW: 13.4 % (ref 11.5–15.5)
WBC: 10.7 10*3/uL — AB (ref 4.0–10.5)

## 2017-06-15 LAB — I-STAT TROPONIN, ED: Troponin i, poc: 0 ng/mL (ref 0.00–0.08)

## 2017-06-15 LAB — CBG MONITORING, ED: GLUCOSE-CAPILLARY: 112 mg/dL — AB (ref 65–99)

## 2017-06-15 MED ORDER — SODIUM CHLORIDE 0.9 % IV BOLUS
1000.0000 mL | Freq: Once | INTRAVENOUS | Status: AC
  Administered 2017-06-15: 1000 mL via INTRAVENOUS

## 2017-06-15 NOTE — ED Provider Notes (Addendum)
[facility] EMERGENCY DEPARTMENT Provider Note   CSN: [id number] Arrival date & time: 06/15/17  2041     History   Chief Complaint Chief Complaint  Patient presents with  . Loss of Consciousness  . Bradycardia    HPI Isaiah Murphy is a 83 y.o. male.  The history is provided by the patient and medical records. No language interpreter was used.  Loss of Consciousness   This is a new problem. The current episode started less than 1 hour ago. The problem occurs rarely. The problem has been resolved. Length of episode of loss of consciousness: unknown. The problem is associated with normal activity. Associated symptoms include malaise/fatigue. Pertinent negatives include abdominal pain, back pain, bladder incontinence, chest pain, confusion, congestion, diaphoresis, dizziness, fever, headaches, light-headedness, nausea, palpitations, seizures, vertigo, vomiting and weakness. He has tried nothing for the symptoms. The treatment provided no relief.    Past Medical History:  Diagnosis Date  . Acute blood loss anemia   . Allergic rhinitis   . Arthritis   . Constipation   . Dyslipidemia   . Dysrhythmia    history of paroxysmal A Fib and typical A Flutter   . GERD (gastroesophageal reflux disease)   . History of bronchitis   . History of sick sinus syndrome   . History of syncope   . HLD (hyperlipidemia)   . Hypertension   . Hyponatremia   . Leukocytosis   . Moderate persistent asthma with acute exacerbation in adult   . Presence of permanent cardiac pacemaker   . Primary osteoarthritis of right hip   . Sleep apnea    pt states not currently using CPAP machine   . Unsteady gait     Patient Active Problem List   Diagnosis Date Noted  . OA (osteoarthritis) of hip 03/08/2015  . Asthma 02/04/2015  . Atrial flutter (HCC) 04/25/2013  . Near syncope 04/25/2013  . HTN (hypertension) 04/25/2013  . Dyslipidemia 04/25/2013    Past Surgical History:  Procedure  Laterality Date  . CARDIOVERSION N/A 04/28/2013   Procedure: CARDIOVERSION;  Surgeon: Lars Masson, MD;  Location: [facility] ENDOSCOPY;  Service: Cardiovascular;  Laterality: N/A;  . HEMORRHOID SURGERY    . left shoulder surgery      secondary to torn ligament / subluxation  . TEE WITHOUT CARDIOVERSION N/A 04/28/2013   Procedure: TRANSESOPHAGEAL ECHOCARDIOGRAM (TEE);  Surgeon: Lars Masson, MD;  Location: [facility] ENDOSCOPY;  Service: Cardiovascular;  Laterality: N/A;  . TOTAL HIP ARTHROPLASTY Right 03/08/2015   Procedure: RIGHT TOTAL HIP ARTHROPLASTY ANTERIOR APPROACH;  Surgeon: Ollen Gross, MD;  Location: [facility] ORS;  Service: Orthopedics;  Laterality: Right;        Home Medications    Prior to Admission medications   Medication Sig Start Date End Date Taking? Authorizing Provider  acetaminophen (TYLENOL) 325 MG tablet Take 2 tablets (650 mg total) by mouth every 6 (six) hours as needed for mild pain (or Fever >/= 101). Patient not taking: Reported on 04/12/2015 03/11/15   Julien Girt, Alexzandrew L, PA-C  albuterol (PROAIR HFA) 108 (90 Base) MCG/ACT inhaler Inhale 2 puffs into the lungs every 4 (four) hours as needed for wheezing or shortness of breath (cough). 02/04/15   Mikki Santee, MD  amLODipine (NORVASC) 10 MG tablet Take 10 mg by mouth every morning.     [provider]  apixaban (ELIQUIS) 5 MG TABS tablet Take 1 tablet (5 mg total) by mouth 2 (two) times daily. 04/29/13   Janee Morn,  Wille Celeste, PA-C  aspirin EC 81 MG EC tablet Take 1 tablet (81 mg total) by mouth daily. Patient not taking: Reported on 04/12/2015 04/29/13   Janetta Hora, PA-C  atorvastatin (LIPITOR) 20 MG tablet Take 20 mg by mouth at bedtime.     [provider]  bisacodyl (DULCOLAX) 10 MG suppository Place 1 suppository (10 mg total) rectally daily as needed for moderate constipation. Patient not taking: Reported on 04/12/2015 03/11/15   Julien Girt, Alexzandrew L, PA-C  chlorpheniramine-HYDROcodone (TUSSIONEX  PENNKINETIC ER) 10-8 MG/5ML SUER Take 5 mLs by mouth every 12 (twelve) hours as needed for cough. Patient not taking: Reported on 04/12/2015 02/04/15   Mikki Santee, MD  ciclesonide (ALVESCO) 160 MCG/ACT inhaler Use one puff twice daily to prevent cough or wheeze.  Increase to 2 puffs twice daily with asthma flare. Rinse, gargle and spit after use 07/23/15   Mikki Santee, MD  diphenhydrAMINE (BENADRYL) 12.5 MG/5ML elixir Take 5-10 mLs (12.5-25 mg total) by mouth every 4 (four) hours as needed for itching. Patient not taking: Reported on 04/12/2015 03/11/15   Julien Girt, Alexzandrew L, PA-C  esomeprazole (NEXIUM) 40 MG capsule Take 40 mg by mouth daily. Reported on 02/04/2015    [provider]  hydrochlorothiazide (HYDRODIURIL) 25 MG tablet Take 25 mg by mouth at bedtime.     [provider]  lisinopril (PRINIVIL,ZESTRIL) 40 MG tablet Take 40 mg by mouth 2 (two) times daily.    [provider]  methocarbamol (ROBAXIN) 500 MG tablet Take 1 tablet (500 mg total) by mouth every 6 (six) hours as needed for muscle spasms. Patient not taking: Reported on 04/12/2015 03/11/15   Julien Girt, Alexzandrew L, PA-C  metoCLOPramide (REGLAN) 5 MG tablet Take 1 tablet (5 mg total) by mouth every 8 (eight) hours as needed for nausea (if ondansetron (ZOFRAN) ineffective.). Patient not taking: Reported on 04/12/2015 03/11/15   Julien Girt, Alexzandrew L, PA-C  mometasone (NASONEX) 50 MCG/ACT nasal spray Place 2 sprays into the nose daily.     [provider]  montelukast (SINGULAIR) 10 MG tablet Take 10 mg by mouth at bedtime.    [provider]  olopatadine (PATANOL) 0.1 % ophthalmic solution Place 1 drop into both eyes 2 (two) times daily.    [provider]  ondansetron (ZOFRAN) 4 MG tablet Take 1 tablet (4 mg total) by mouth every 6 (six) hours as needed for nausea. Patient not taking: Reported on 04/12/2015 03/11/15   Julien Girt, Alexzandrew L, PA-C  oxyCODONE (OXY IR/ROXICODONE) 5 MG  immediate release tablet Take 1-2 tablets (5-10 mg total) by mouth every 3 (three) hours as needed for moderate pain or severe pain. Patient not taking: Reported on 04/12/2015 03/11/15   Julien Girt, Alexzandrew L, PA-C  polyethylene glycol (MIRALAX / GLYCOLAX) packet Take 17 g by mouth daily as needed for mild constipation. Patient not taking: Reported on 04/12/2015 03/11/15   Julien Girt, Alexzandrew L, PA-C  sennosides-docusate sodium (SENOKOT-S) 8.6-50 MG tablet Take 2 tablets by mouth as needed.     [provider]  sodium phosphate (FLEET) 7-19 GM/118ML ENEM Place 133 mLs (1 enema total) rectally once as needed for severe constipation. Patient not taking: Reported on 04/12/2015 03/11/15   Julien Girt, Alexzandrew L, PA-C  traMADol (ULTRAM) 50 MG tablet Take 1-2 tablets (50-100 mg total) by mouth every 6 (six) hours as needed (mild pain). 03/11/15   Perkins, Alexzandrew L, PA-C  triamcinolone cream (KENALOG) 0.1 % Apply 1 application topically 2 (two) times daily as needed (itching).  Reported on 02/04/2015    [provider]    Family History Family History  Problem Relation Age of Onset  . Hypertension Father   . Allergic rhinitis Neg Hx   . Asthma Neg Hx   . Eczema Neg Hx   . Immunodeficiency Neg Hx   . Urticaria Neg Hx     Social History Social History   Tobacco Use  . Smoking status: Never Smoker  . Smokeless tobacco: Never Used  Substance Use Topics  . Alcohol use: Yes    Alcohol/week: 0.0 oz    Comment: socially   . Drug use: No     Allergies   Penicillins   Review of Systems Review of Systems  Constitutional: Positive for malaise/fatigue. Negative for chills, diaphoresis, fatigue and fever.  HENT: Negative for congestion.   Respiratory: Negative for cough, chest tightness, shortness of breath, wheezing and stridor.   Cardiovascular: Positive for syncope. Negative for chest pain and palpitations.  Gastrointestinal: Negative for abdominal pain, constipation, diarrhea,  nausea and vomiting.  Genitourinary: Negative for bladder incontinence and dysuria.  Musculoskeletal: Negative for back pain, neck pain and neck stiffness.  Skin: Negative for wound.  Neurological: Positive for syncope. Negative for dizziness, vertigo, seizures, speech difficulty, weakness, light-headedness and headaches.  Psychiatric/Behavioral: Negative for agitation and confusion.  All other systems reviewed and are negative.    Physical Exam Updated Vital Signs BP 132/86   Temp (!) 97.5 F (36.4 C) (Oral)   Resp 16   Physical Exam  Constitutional: He is oriented to person, place, and time. He appears well-developed and well-nourished. No distress.  HENT:  Head: Normocephalic and atraumatic.  Nose: Nose normal.  Mouth/Throat: Oropharynx is clear and moist. No oropharyngeal exudate.  Eyes: Conjunctivae are normal.  Neck: Neck supple.  Cardiovascular: Normal rate and regular rhythm.  No murmur heard. Pulmonary/Chest: Effort normal and breath sounds normal. No respiratory distress. He has no wheezes. He has no rales. He exhibits no tenderness.  Abdominal: Soft. There is no tenderness.  Musculoskeletal: He exhibits no edema or tenderness.  Neurological: He is alert and oriented to person, place, and time. No sensory deficit. He exhibits normal muscle tone.  Skin: Skin is warm and dry. Capillary refill takes less than 2 seconds. He is not diaphoretic. No pallor.  Psychiatric: He has a normal mood and affect.  Nursing note and vitals reviewed.    ED Treatments / Results  Labs (all labs ordered are listed, but only abnormal results are displayed) Labs Reviewed  BASIC METABOLIC PANEL - Abnormal; Notable for the following components:      Result Value   Glucose, Bld 114 (*)    Creatinine, Ser 1.47 (*)    Calcium 8.2 (*)    GFR calc non Af Amer 43 (*)    GFR calc Af Amer 50 (*)    All other components within normal limits  CBC - Abnormal; Notable for the following  components:   WBC 10.7 (*)    All other components within normal limits  BASIC METABOLIC PANEL - Abnormal; Notable for the following components:   Glucose, Bld 108 (*)    Creatinine, Ser 1.28 (*)    Calcium 7.6 (*)    GFR calc non Af Amer 51 (*)    GFR calc Af Amer 59 (*)    All other components within normal limits  CBG MONITORING, ED - Abnormal; Notable for the following components:   Glucose-Capillary 112 (*)    All other  components within normal limits  URINALYSIS, ROUTINE W REFLEX MICROSCOPIC  MAGNESIUM  I-STAT TROPONIN, ED    EKG EKG Interpretation  Date/Time:  Friday Jun 15 2017 20:47:25 EDT Ventricular Rate:  70 PR Interval:    QRS Duration: 98 QT Interval:  359 QTC Calculation: 388 R Axis:   -38 Text Interpretation:  Atrial-paced rhythm Left axis deviation Nonspecific T abnormalities, lateral leads When compared to prior, now paced rhythm No STEMI Confirmed by Theda Belfast ([id number]) on 06/15/2017 8:58:07 PM   Radiology Dg Chest 2 View  Result Date: 06/15/2017 CLINICAL DATA:  Syncope EXAM: CHEST - 2 VIEW COMPARISON:  Chest radiograph 04/25/2013 FINDINGS: There is a leftchest wall pacemakerwith leads projecting within the right atrium and right ventricle. Lungs are clear. Normal cardiomediastinal contours. No focal airspace consolidation or pulmonary edema. IMPRESSION: No active cardiopulmonary disease. Electronically Signed   By: Deatra Robinson M.D.   On: 06/15/2017 21:40    Procedures Procedures (including critical care time)  CRITICAL CARE Performed by: Canary Brim Tegeler Total critical care time: 30 minutes Critical care time was exclusive of separately billable procedures and treating other patients. Critical care was necessary to treat or prevent imminent or life-threatening deterioration. Critical care was time spent personally by me on the following activities: development of treatment plan with patient and/or surrogate as well as nursing, discussions with  consultants, evaluation of patient's response to treatment, examination of patient, obtaining history from patient or surrogate, ordering and performing treatments and interventions, ordering and review of laboratory studies, ordering and review of radiographic studies, pulse oximetry and re-evaluation of patient's condition.   Medications Ordered in ED Medications  sodium chloride 0.9 % bolus 1,000 mL (0 mLs Intravenous Stopped 06/15/17 2131)  sodium chloride 0.9 % bolus 1,000 mL (0 mLs Intravenous Stopped 06/15/17 2249)     Initial Impression / Assessment and Plan / ED Course  I have reviewed the triage vital signs and the nursing notes.  Pertinent labs & imaging results that were available during my care of the patient were reviewed by me and considered in my medical decision making (see chart for details).     Isaiah Murphy is a 83 y.o. male with a past medical history significant for asthma, hypertension, hyperlipidemia, GERD, and prior sick sinus syndrome status post pacemaker on Eliquis therapy who presents with syncope.  Patient reports that he recently traveled from [place] back to [place] today.  He says that he drove without any difficulty.  He says he did not eat or drink much  during his trip and thinks he may be slightly dehydrated.  He reports that he has had dry mouth.  He says that when he was walking inside his home, he got hot, lightheaded, and then syncopized.  He reports that he is not sure how long he was out for but a family member said he was waking up when he was found.  He denies any head injury or headache.  He denies any neck pain.  He denied any preceding palpitations, shortness of breath, or diaphoresis.  He denies any nausea, vomiting, conservation, diarrhea, or dysuria.  He reports chronic hip pains but denies any history of DVT or PE.  He is on blood thinners.   On exam, patient had a paced rhythm in the 70s.  EKG showed no STEMI.  Patient had normal  oxygen saturations on room air.  Patient was normotensive after receiving 250 cc of normal saline with EMS.  Patient's lungs were  clear and chest was nontender.  Abdomen nontender.  Legs not edematous.  Patient was alert and oriented and feeling normal by his report.    Given patient's report of feeling dehydrated and his long trip without fluids I suspect he has a syncopal episode from dehydration however, patient had work-up to look for arrhythmia, electrode abnormality, or occult infection.    [employer] pacemaker was interrogated and they found no abnormalities today.  No evidence of arrhythmias.  Laboratory testing showed acute kidney injury with a creatinine of 1.4.  Fluids were given.    Patient's family was called and they agree he is likely dehydrated causing his syncopal episode.  Laboratory testing was otherwise reassuring in regards to electrolytes and negative troponin.  Chest x-ray shows no pneumonia.    Patient had repeat creatinine that was improving.  After speaking with patient's family they agreed with patient going home.  Patient will follow up with his PCP in several days for recheck of creatinine and for rehydration.  Patient was unable to urinate for [place] but he denied any urinary symptoms.  He does not want to wait for urinalysis at this time.  Next  Patient family had no other questions or concerns and patient was discharged in good condition after our shared decision making conversation.              Final Clinical Impressions(s) / ED Diagnoses   Final diagnoses:  Syncope and collapse  Dehydration  AKI (acute kidney injury) [facility])    ED Discharge Orders    None     Clinical Impression: 1. Syncope and collapse   2. Dehydration   3. AKI (acute kidney injury) (HCC)     Disposition: Discharge  Condition: Good  I have discussed the results, Dx and Tx plan with the pt(& family if present). He/she/they expressed understanding and agree(s) with the plan.  Discharge instructions discussed at great length. Strict return precautions discussed and pt &/or family have verbalized understanding of the instructions. No further questions at time of discharge.    New Prescriptions   No medications on file    Follow Up: [facility] EMERGENCY DEPARTMENT [address] [id number] mc [place] [address] [phone]       Tegeler, Canary Brim, MD 06/16/17 [phone]    Tegeler, Canary Brim, MD 06/25/17 1410

## 2017-06-15 NOTE — ED Triage Notes (Signed)
Patient here by EMS for a near syncopal episode this evening.  Patient has METRONIC demand pacemaker which has not given him any problems.  Initally patient was diaphoretic and had no radial pulses for EMS.  After 500cc of fluids BP is 103/56.  A&Ox4

## 2017-06-15 NOTE — Discharge Instructions (Signed)
Your work-up today was overall reassuring aside from the slightly elevated kidney function.  After fluids it has begun to improve.  Given your demonstrated stability over several hours and your pacemaker interrogation which showed no arrhythmias, we feel you are safe for discharge home.  Please maintain hydration at home and follow-up with your primary doctor.  If any symptoms recur, change, or worsen, please do not hesitate to return to the nearest emergency department.

## 2017-06-16 NOTE — ED Notes (Signed)
Patient Alert and oriented to baseline. Stable and ambulatory to baseline. Patient verbalized understanding of the discharge instructions.  Patient belongings were taken by the patient.   

## 2017-10-12 ENCOUNTER — Ambulatory Visit (INDEPENDENT_AMBULATORY_CARE_PROVIDER_SITE_OTHER): Payer: Self-pay | Admitting: Nurse Practitioner

## 2017-12-14 ENCOUNTER — Ambulatory Visit (INDEPENDENT_AMBULATORY_CARE_PROVIDER_SITE_OTHER): Payer: Self-pay

## 2018-02-14 ENCOUNTER — Ambulatory Visit (INDEPENDENT_AMBULATORY_CARE_PROVIDER_SITE_OTHER): Payer: Self-pay | Admitting: Cardiovascular Disease

## 2018-02-23 ENCOUNTER — Ambulatory Visit (INDEPENDENT_AMBULATORY_CARE_PROVIDER_SITE_OTHER): Payer: Self-pay

## 2018-03-01 ENCOUNTER — Other Ambulatory Visit (INDEPENDENT_AMBULATORY_CARE_PROVIDER_SITE_OTHER): Payer: Self-pay

## 2018-03-01 ENCOUNTER — Ambulatory Visit (INDEPENDENT_AMBULATORY_CARE_PROVIDER_SITE_OTHER): Payer: Self-pay | Admitting: Critical Care Medicine

## 2018-03-15 ENCOUNTER — Ambulatory Visit (INDEPENDENT_AMBULATORY_CARE_PROVIDER_SITE_OTHER): Payer: Self-pay

## 2018-04-15 ENCOUNTER — Ambulatory Visit (INDEPENDENT_AMBULATORY_CARE_PROVIDER_SITE_OTHER): Payer: Self-pay | Admitting: Cardiology

## 2018-09-18 DIAGNOSIS — M25552 Pain in left hip: Secondary | ICD-10-CM | POA: Insufficient documentation

## 2018-10-03 DIAGNOSIS — M25552 Pain in left hip: Secondary | ICD-10-CM | POA: Diagnosis not present

## 2018-10-15 DIAGNOSIS — M25552 Pain in left hip: Secondary | ICD-10-CM | POA: Diagnosis not present

## 2018-10-22 DIAGNOSIS — M25552 Pain in left hip: Secondary | ICD-10-CM | POA: Diagnosis not present

## 2018-10-24 DIAGNOSIS — M25552 Pain in left hip: Secondary | ICD-10-CM | POA: Diagnosis not present

## 2018-10-29 DIAGNOSIS — M25552 Pain in left hip: Secondary | ICD-10-CM | POA: Diagnosis not present

## 2018-10-31 DIAGNOSIS — M25552 Pain in left hip: Secondary | ICD-10-CM | POA: Diagnosis not present

## 2018-11-05 DIAGNOSIS — M25552 Pain in left hip: Secondary | ICD-10-CM | POA: Diagnosis not present

## 2018-11-14 DIAGNOSIS — M7062 Trochanteric bursitis, left hip: Secondary | ICD-10-CM | POA: Diagnosis not present

## 2018-11-14 DIAGNOSIS — M25552 Pain in left hip: Secondary | ICD-10-CM | POA: Diagnosis not present

## 2018-11-20 DIAGNOSIS — M25552 Pain in left hip: Secondary | ICD-10-CM | POA: Diagnosis not present

## 2018-11-27 DIAGNOSIS — M25552 Pain in left hip: Secondary | ICD-10-CM | POA: Diagnosis not present

## 2018-12-04 NOTE — Progress Notes (Signed)
Cardiology Office Note:    Date:  12/06/2018   ID:  Isaiah Murphy, DOB 13-Jun-1934, MRN [id number]  PCP:  Garnette Czech, MD  Cardiologist:  No primary care provider on file.   Referring MD: No ref. provider found   No chief complaint on file.   History of Present Illness:    Isaiah Murphy is a 84 y.o. 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  Most recent LVEF by echocardiogram 2018 60%.  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  He was a former professor at [employer].  Cardiac history is that of tachybradycardia syndrome that initially presented with syncope.  He was found to have paroxysmal of atrial fibrillation and flutter associated with post conversion pauses and syncope.  DDD pacemaker implantation in [place] has resolve the problem.  He has been on chronic anticoagulation therapy since that time in 2015.  Most recent LVEF in 2018 was 60% as noted above.  There is no history of significant coronary artery disease.  Past Medical History:  Diagnosis Date  . Acute blood loss anemia   . Allergic rhinitis   . Arthritis   . Constipation   . Dyslipidemia   . Dysrhythmia    history of paroxysmal A Fib and typical A Flutter   . GERD (gastroesophageal reflux disease)   . History of bronchitis   . History of sick sinus syndrome   . History of syncope   . HLD (hyperlipidemia)   . Hypertension   . Hyponatremia   . Leukocytosis   . Moderate persistent asthma with acute exacerbation in adult   . Presence of permanent cardiac pacemaker   . Primary osteoarthritis of right hip   . Sleep apnea    pt states not currently using CPAP machine   . Unsteady gait      Past Surgical History:  Procedure Laterality Date  . CARDIOVERSION N/A 04/28/2013   Procedure: CARDIOVERSION;  Surgeon: Lars Masson, MD;  Location: [facility] ENDOSCOPY;  Service: Cardiovascular;  Laterality: N/A;  . HEMORRHOID SURGERY    . left shoulder surgery      secondary to torn ligament / subluxation  . TEE WITHOUT CARDIOVERSION N/A 04/28/2013   Procedure: TRANSESOPHAGEAL ECHOCARDIOGRAM (TEE);  Surgeon: Lars Masson, MD;  Location: [facility] ENDOSCOPY;  Service: Cardiovascular;  Laterality: N/A;  . TOTAL HIP ARTHROPLASTY Right 03/08/2015   Procedure: RIGHT TOTAL HIP ARTHROPLASTY ANTERIOR APPROACH;  Surgeon: Ollen Gross, MD;  Location: [facility] ORS;  Service: Orthopedics;  Laterality: Right;    Current Medications: Current Meds  Medication Sig  . albuterol (VENTOLIN HFA) 108 (90 Base) MCG/ACT inhaler Inhale 2 puffs into the lungs every 6 (six) hours as needed for wheezing or shortness of breath.  Marland Kitchen apixaban (ELIQUIS) 5 MG TABS tablet Take 1 tablet (5 mg total) by mouth 2 (two) times daily.  Marland Kitchen atorvastatin (LIPITOR) 20 MG tablet Take 20 mg by mouth at bedtime.   . Azilsartan-Chlorthalidone (EDARBYCLOR) 40-12.5 MG TABS Take 1 tablet by mouth daily.  . bisacodyl (DULCOLAX) 10 MG suppository Place 1 suppository (10 mg total) rectally daily as needed for moderate constipation.  . ciclesonide (ALVESCO) 160 MCG/ACT inhaler Use one puff twice daily to prevent cough or wheeze.  Increase to 2 puffs twice daily  with asthma flare. Rinse, gargle and spit after use  . diphenhydrAMINE (BENADRYL) 25 MG tablet Take 25 mg by mouth as needed.  Marland Kitchen. esomeprazole (NEXIUM) 40 MG capsule Take 40 mg by mouth daily. Reported on 02/04/2015  . fluticasone (FLONASE) 50 MCG/ACT nasal spray Place 2 sprays into both nostrils daily.  . mometasone (NASONEX) 50 MCG/ACT nasal spray Place 2 sprays into the nose daily.   . montelukast (SINGULAIR) 10 MG tablet Take 10 mg by mouth as needed.   Marland Kitchen. olopatadine (PATANOL) 0.1 %  ophthalmic solution Place 1 drop into both eyes daily.   . polyethylene glycol (MIRALAX / GLYCOLAX) packet Take 17 g by mouth daily as needed for mild constipation.  . sodium phosphate (FLEET) 7-19 GM/118ML ENEM Place 133 mLs (1 enema total) rectally once as needed for severe constipation.  . triamcinolone cream (KENALOG) 0.1 % Apply 1 application topically 2 (two) times daily as needed (itching). Reported on 02/04/2015     Allergies:   Penicillins   Social History   Socioeconomic History  . Marital status: Married    Spouse name: Not on file  . Number of children: Not on file  . Years of education: Not on file  . Highest education level: Not on file  Occupational History  . Not on file  Social Needs  . Financial resource strain: Not on file  . Food insecurity    Worry: Not on file    Inability: Not on file  . Transportation needs    Medical: Not on file    Non-medical: Not on file  Tobacco Use  . Smoking status: Never Smoker  . Smokeless tobacco: Never Used  Substance and Sexual Activity  . Alcohol use: Yes    Alcohol/week: 0.0 standard drinks    Comment: socially   . Drug use: No  . Sexual activity: Not on file  Lifestyle  . Physical activity    Days per week: Not on file    Minutes per session: Not on file  . Stress: Not on file  Relationships  . Social Musicianconnections    Talks on phone: Not on file    Gets together: Not on file    Attends religious service: Not on file    Active member of club or organization: Not on file    Attends meetings of clubs or organizations: Not on file    Relationship status: Not on file  Other Topics Concern  . 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 There is no history of Allergic rhinitis, Asthma, Eczema, Immunodeficiency, or Urticaria.  ROS:   Please see the history of present illness.     He has some hip and back stiffness but otherwise no significant problems.  He is on therapy for hyperlipidemia as well.  All other systems reviewed and are negative.  EKGs/Labs/Other Studies Reviewed:    The following studies were reviewed today: No recent functional data.  Echocardiogram 07/2016: EF 60%, LV cavity size is normal.  Normal right heart.  Mild mitral regurgitation.  Mild aortic valve stenosis with area 1.8 cm.  Normal aortic root size.  Sleep study in January 2020 documenting moderate position dependent obstructive sleep apnea.  Now on CPAP.  Last DDD pacemaker evaluation February 2020.  EKG:  EKG Demonstrates atrial and ventricular pacing 74 bpm.  No prior tracing to compare  Recent Labs: No results found for requested labs within last 8760 hours.  Recent Lipid Panel    Component Value Date/Time   CHOL 108 04/26/2013 0100   TRIG 52 04/26/2013 0100   HDL 36 (L) 04/26/2013 0100   CHOLHDL 3.0 04/26/2013 0100   VLDL 10 04/26/2013 0100   LDLCALC 62 04/26/2013 0100    Physical Exam:    VS:  BP 122/74   Pulse 76   Ht 5' 9.5" (1.765 m)   Wt 192 lb 9.6 oz (87.4 kg)   SpO2 97%   BMI 28.03 kg/m     Wt Readings from Last 3 Encounters:  12/06/18 192 lb 9.6 oz (87.4 kg)  03/19/15 184 lb (83.5 kg)  03/12/15 181 lb (82.1 kg)     GEN: Age-appropriate in appearance.. No acute distress HEENT: Normal NECK: No JVD. LYMPHATICS: No lymphadenopathy CARDIAC: 1/6 systolic right upper sternal border murmur.  RRR without diastolic murmur, gallop, or edema. VASCULAR:  Normal Pulses. No bruits. RESPIRATORY:  Clear to auscultation without rales, wheezing or rhonchi  ABDOMEN: Soft, non-tender, non-distended, No pulsatile mass, MUSCULOSKELETAL: No deformity  SKIN: Warm and dry NEUROLOGIC:  Alert and oriented x 3 PSYCHIATRIC:  Normal affect   ASSESSMENT:    1. Essential hypertension   2. Pacemaker   3. Sick sinus syndrome ([place])   4. Atypical atrial flutter ([place])   5.  Dyslipidemia   6. Educated about COVID-19 virus infection   7. Obstructive sleep apnea   8. Nonrheumatic mitral valve regurgitation    PLAN:    In order of problems listed above:  1. Excellent blood pressure control on today's exam, 140/80 mmHg. 2. He will establish in the pacemaker clinic under the direction of Dr. Cristopher Peru. 3. Controlled sick sinus syndrome and paroxysmal of atrial flutter/fibrillation since pacemaker implantation.  He is on chronic anticoagulation therapy with apixaban. 4. Did not have ablation. 5. LDL target of 70.  No recent laboratory data. 6. Social distancing, mask wearing, and handwashing is being adhered to. 7. CPAP use is endorsed. 8. Mitral regurgitation and aortic stenosis murmurs are faint.  Likely represents senescent findings.  Will follow clinically without further imaging at this time.  Plan clinical follow-up in 6 months with me.  Hopefully he will be seen in the device clinic prior to the visit with me.  He will find a primary care physician as well.   Medication Adjustments/Labs and Tests Ordered: Current medicines are reviewed at length with the patient today.  Concerns regarding medicines are outlined above.  Orders Placed This Encounter  Procedures  . Ambulatory referral to Cardiac Electrophysiology  . EKG 12-Lead   No orders of the defined types were placed in this encounter.   Patient Instructions  Medication Instructions:  Your physician recommends that you continue on your current medications as directed. Please refer to the Current Medication list given to you today.  *If you need a refill on your cardiac medications before your next appointment, please call your pharmacy*  Lab Work: None If you have labs (blood work) drawn today and your tests are completely normal, you will receive your results only by: Marland Kitchen MyChart Message (if you have MyChart) OR . A paper copy in the mail If you have any lab test that is abnormal or we need  to change your treatment, we will call you to review the results.  Testing/Procedures: None  Follow-Up: At [facility], you and your health needs are our priority.  As part  of our continuing mission to provide you with exceptional heart care, we have created designated Provider Care Teams.  These Care Teams include your primary Cardiologist (physician) and Advanced Practice Providers (APPs -  Physician Assistants and Nurse Practitioners) who all work together to provide you with the care you need, when you need it.  Your next appointment:   6 months  The format for your next appointment:   In Person  Provider:   You may see Dr. Verdis Prime or one of the following Advanced Practice Providers on your designated Care Team:    Norma Fredrickson, NP  Nada Boozer, NP  Georgie Chard, NP   Other Instructions  You have been referred to Dr. Ladona Ridgel in our Electrophysiology Department.      Signed, Lesleigh Noe, MD  12/06/2018 3:15 PM    [facility]

## 2018-12-06 ENCOUNTER — Ambulatory Visit: Payer: Federal, State, Local not specified - PPO | Admitting: Interventional Cardiology

## 2018-12-06 ENCOUNTER — Encounter: Payer: Self-pay | Admitting: Interventional Cardiology

## 2018-12-06 ENCOUNTER — Other Ambulatory Visit: Payer: Self-pay

## 2018-12-06 VITALS — BP 122/74 | HR 76 | Ht 69.5 in | Wt 192.6 lb

## 2018-12-06 DIAGNOSIS — I484 Atypical atrial flutter: Secondary | ICD-10-CM

## 2018-12-06 DIAGNOSIS — I340 Nonrheumatic mitral (valve) insufficiency: Secondary | ICD-10-CM

## 2018-12-06 DIAGNOSIS — Z7189 Other specified counseling: Secondary | ICD-10-CM

## 2018-12-06 DIAGNOSIS — I10 Essential (primary) hypertension: Secondary | ICD-10-CM

## 2018-12-06 DIAGNOSIS — I495 Sick sinus syndrome: Secondary | ICD-10-CM

## 2018-12-06 DIAGNOSIS — E785 Hyperlipidemia, unspecified: Secondary | ICD-10-CM

## 2018-12-06 DIAGNOSIS — Z950 Presence of cardiac pacemaker: Secondary | ICD-10-CM | POA: Diagnosis not present

## 2018-12-06 DIAGNOSIS — G4733 Obstructive sleep apnea (adult) (pediatric): Secondary | ICD-10-CM

## 2018-12-06 NOTE — Patient Instructions (Signed)
Medication Instructions:  Your physician recommends that you continue on your current medications as directed. Please refer to the Current Medication list given to you today.  *If you need a refill on your cardiac medications before your next appointment, please call your pharmacy*  Lab Work: None If you have labs (blood work) drawn today and your tests are completely normal, you will receive your results only by: Marland Kitchen MyChart Message (if you have MyChart) OR . A paper copy in the mail If you have any lab test that is abnormal or we need to change your treatment, we will call you to review the results.  Testing/Procedures: None  Follow-Up: At [facility], you and your health needs are our priority.  As part of our continuing mission to provide you with exceptional heart care, we have created designated Provider Care Teams.  These Care Teams include your primary Cardiologist (physician) and Advanced Practice Providers (APPs -  Physician Assistants and Nurse Practitioners) who all work together to provide you with the care you need, when you need it.  Your next appointment:   6 months  The format for your next appointment:   In Person  Provider:   You may see Dr. Daneen Schick or one of the following Advanced Practice Providers on your designated Care Team:    Truitt Merle, NP  Cecilie Kicks, NP  Kathyrn Drown, NP   Other Instructions  You have been referred to Dr. Lovena Le in our Electrophysiology Department.

## 2018-12-27 DIAGNOSIS — M25552 Pain in left hip: Secondary | ICD-10-CM | POA: Diagnosis not present

## 2018-12-27 DIAGNOSIS — M7062 Trochanteric bursitis, left hip: Secondary | ICD-10-CM | POA: Diagnosis not present

## 2019-01-01 ENCOUNTER — Ambulatory Visit: Payer: Federal, State, Local not specified - PPO | Admitting: Internal Medicine

## 2019-01-01 ENCOUNTER — Other Ambulatory Visit: Payer: Self-pay

## 2019-01-01 ENCOUNTER — Encounter: Payer: Self-pay | Admitting: Internal Medicine

## 2019-01-01 VITALS — BP 134/82 | HR 83 | Ht 69.0 in | Wt 194.4 lb

## 2019-01-01 DIAGNOSIS — I442 Atrioventricular block, complete: Secondary | ICD-10-CM

## 2019-01-01 DIAGNOSIS — Z950 Presence of cardiac pacemaker: Secondary | ICD-10-CM | POA: Diagnosis not present

## 2019-01-01 DIAGNOSIS — I495 Sick sinus syndrome: Secondary | ICD-10-CM | POA: Diagnosis not present

## 2019-01-01 NOTE — Patient Instructions (Signed)

## 2019-01-01 NOTE — Progress Notes (Signed)
HPI [name] is referred today by Dr. Katrinka Blazing for ongoing evaluation and management of his PPM. He is a pleasant 84 yo man with a h/o HTN, sinus node dysfunction and CHB, who is s/p PPM insertion. He has moved back to [place] from [place]. He has PAF.  Allergies  Allergen Reactions  . Penicillins Rash    Has patient had a PCN reaction causing immediate rash, facial/tongue/throat swelling, SOB or lightheadedness with hypotension: Yes Has patient had a PCN reaction causing severe rash involving mucus membranes or skin necrosis: Yes Has patient had a PCN reaction that required hospitalization: No Has patient had a PCN reaction occurring within the last 10 years: No If all of the above answers are "NO", then may proceed with Cephalosporin use. Rash at the injection site     Current Outpatient Medications  Medication Sig Dispense Refill  . albuterol (VENTOLIN HFA) 108 (90 Base) MCG/ACT inhaler Inhale 2 puffs into the lungs every 6 (six) hours as needed for wheezing or shortness of breath.    Marland Kitchen apixaban (ELIQUIS) 5 MG TABS tablet Take 1 tablet (5 mg total) by mouth 2 (two) times daily. 60 tablet 0  . atorvastatin (LIPITOR) 20 MG tablet Take 20 mg by mouth at bedtime.     . Azilsartan-Chlorthalidone (EDARBYCLOR) 40-12.5 MG TABS Take 1 tablet by mouth daily.    . bisacodyl (DULCOLAX) 10 MG suppository Place 1 suppository (10 mg total) rectally daily as needed for moderate constipation. 12 suppository 0  . ciclesonide (ALVESCO) 160 MCG/ACT inhaler Use one puff twice daily to prevent cough or wheeze.  Increase to 2 puffs twice daily with asthma flare. Rinse, gargle and spit after use 1 Inhaler 1  . diphenhydrAMINE (BENADRYL) 25 MG tablet Take 25 mg by mouth as needed.    Marland Kitchen esomeprazole (NEXIUM) 40 MG capsule Take 40 mg by mouth daily. Reported on 02/04/2015    . fluticasone (FLONASE) 50 MCG/ACT nasal spray Place 2 sprays into both nostrils daily.    . mometasone (NASONEX) 50 MCG/ACT nasal  spray Place 2 sprays into the nose daily.     . montelukast (SINGULAIR) 10 MG tablet Take 10 mg by mouth as needed.     Marland Kitchen olopatadine (PATANOL) 0.1 % ophthalmic solution Place 1 drop into both eyes daily.     . polyethylene glycol (MIRALAX / GLYCOLAX) packet Take 17 g by mouth daily as needed for mild constipation. 14 each 0  . sodium phosphate (FLEET) 7-19 GM/118ML ENEM Place 133 mLs (1 enema total) rectally once as needed for severe constipation. 2 Bottle 0  . triamcinolone cream (KENALOG) 0.1 % Apply 1 application topically 2 (two) times daily as needed (itching). Reported on 02/04/2015     No current facility-administered medications for this visit.      Past Medical History:  Diagnosis Date  . Acute blood loss anemia   . Allergic rhinitis   . Arthritis   . Constipation   . Dyslipidemia   . Dysrhythmia    history of paroxysmal A Fib and typical A Flutter   . GERD (gastroesophageal reflux disease)   . History of bronchitis   . History of sick sinus syndrome   . History of syncope   . HLD (hyperlipidemia)   . Hypertension   . Hyponatremia   . Leukocytosis   . Moderate persistent asthma with acute exacerbation in adult   . Presence of permanent cardiac pacemaker   . Primary osteoarthritis of right hip   .  Sleep apnea    pt states not currently using CPAP machine   . Unsteady gait     ROS:   All systems reviewed and negative except as noted in the HPI.   Past Surgical History:  Procedure Laterality Date  . CARDIOVERSION N/A 04/28/2013   Procedure: CARDIOVERSION;  Surgeon: Dorothy Spark, MD;  Location: [place];  Service: Cardiovascular;  Laterality: N/A;  . HEMORRHOID SURGERY    . left shoulder surgery      secondary to torn ligament / subluxation  . TEE WITHOUT CARDIOVERSION N/A 04/28/2013   Procedure: TRANSESOPHAGEAL ECHOCARDIOGRAM (TEE);  Surgeon: Dorothy Spark, MD;  Location: [place];  Service: Cardiovascular;  Laterality: N/A;  . TOTAL HIP  ARTHROPLASTY Right 03/08/2015   Procedure: RIGHT TOTAL HIP ARTHROPLASTY ANTERIOR APPROACH;  Surgeon: Gaynelle Arabian, MD;  Location: [facility];  Service: Orthopedics;  Laterality: Right;     Family History  Problem Relation Age of Onset  . Hypertension Father   . Allergic rhinitis Neg Hx   . Asthma Neg Hx   . Eczema Neg Hx   . Immunodeficiency Neg Hx   . Urticaria Neg Hx      Social History   Socioeconomic History  . Marital status: Married    Spouse name: Not on file  . Number of children: Not on file  . Years of education: Not on file  . Highest education level: Not on file  Occupational History  . Not on file  Social Needs  . Financial resource strain: Not on file  . Food insecurity    Worry: Not on file    Inability: Not on file  . Transportation needs    Medical: Not on file    Non-medical: Not on file  Tobacco Use  . Smoking status: Never Smoker  . Smokeless tobacco: Never Used  Substance and Sexual Activity  . Alcohol use: Yes    Alcohol/week: 0.0 standard drinks    Comment: socially   . Drug use: No  . Sexual activity: Not on file  Lifestyle  . Physical activity    Days per week: Not on file    Minutes per session: Not on file  . Stress: Not on file  Relationships  . Social Herbalist on phone: Not on file    Gets together: Not on file    Attends religious service: Not on file    Active member of club or organization: Not on file    Attends meetings of clubs or organizations: Not on file    Relationship status: Not on file  . Intimate partner violence    Fear of current or ex partner: Not on file    Emotionally abused: Not on file    Physically abused: Not on file    Forced sexual activity: Not on file  Other Topics Concern  . Not on file  Social History Narrative   Married, Research scientist (physical sciences) in [place]     BP 134/82   Pulse 83   Ht 5\' 9"  (1.753 m)   Wt 194 lb 6.4 oz (88.2 kg)   SpO2 95%   BMI 28.71 kg/m    Physical Exam:  Well appearing NAD HEENT: Unremarkable Neck:  No JVD, no thyromegally Lymphatics:  No adenopathy Back:  No CVA tenderness Lungs:  Clear with no wheezes HEART:  Regular rate rhythm, no murmurs, no rubs, no clicks Abd:  soft, positive bowel sounds, no organomegally, no rebound, no  guarding Ext:  2 plus pulses, no edema, no cyanosis, no clubbing Skin:  No rashes no nodules Neuro:  CN II through XII intact, motor grossly intact  DEVICE  Normal device function.  See PaceArt for details.   Assess/Plan: 1. Tachybrady syndrome - he is maintaining NSR. He is asymptomatic. 2. CHB - he is asymptomatic. He has no escape today. 3. PPM - his medtronic DDD PM is working normally. We will recheck in several months. 4. HTN - his bp is fairly well controlled. No change in his meds today.  Leonia ReevesGregg Zubayr Bednarczyk,M.D.

## 2019-01-05 LAB — CUP PACEART INCLINIC DEVICE CHECK
Battery Remaining Longevity: 42 mo
Battery Voltage: 2.98 V
Brady Statistic AP VP Percent: 91.17 %
Brady Statistic AP VS Percent: 3.81 %
Brady Statistic AS VP Percent: 4.94 %
Brady Statistic AS VS Percent: 0.07 %
Brady Statistic RA Percent Paced: 94.01 %
Brady Statistic RV Percent Paced: 95.87 %
Date Time Interrogation Session: 20201125124100
Implantable Lead Implant Date: 20150413
Implantable Lead Implant Date: 20150413
Implantable Lead Location: 753859
Implantable Lead Location: 753860
Implantable Lead Model: 5076
Implantable Lead Model: 5076
Implantable Pulse Generator Implant Date: 20150413
Lead Channel Impedance Value: 361 Ohm
Lead Channel Impedance Value: 475 Ohm
Lead Channel Impedance Value: 513 Ohm
Lead Channel Impedance Value: 551 Ohm
Lead Channel Pacing Threshold Amplitude: 0.5 V
Lead Channel Pacing Threshold Amplitude: 0.75 V
Lead Channel Pacing Threshold Pulse Width: 0.4 ms
Lead Channel Pacing Threshold Pulse Width: 0.4 ms
Lead Channel Sensing Intrinsic Amplitude: 13.875 mV
Lead Channel Sensing Intrinsic Amplitude: 2.125 mV
Lead Channel Setting Pacing Amplitude: 1.5 V
Lead Channel Setting Pacing Amplitude: 2.5 V
Lead Channel Setting Pacing Pulse Width: 0.4 ms
Lead Channel Setting Sensing Sensitivity: 0.9 mV

## 2019-01-09 DIAGNOSIS — M25552 Pain in left hip: Secondary | ICD-10-CM | POA: Diagnosis not present

## 2019-01-15 DIAGNOSIS — M25552 Pain in left hip: Secondary | ICD-10-CM | POA: Diagnosis not present

## 2019-01-23 DIAGNOSIS — M25552 Pain in left hip: Secondary | ICD-10-CM | POA: Diagnosis not present

## 2019-01-27 ENCOUNTER — Other Ambulatory Visit: Payer: Self-pay

## 2019-01-27 ENCOUNTER — Ambulatory Visit (INDEPENDENT_AMBULATORY_CARE_PROVIDER_SITE_OTHER): Payer: Federal, State, Local not specified - PPO | Admitting: Otolaryngology

## 2019-01-27 ENCOUNTER — Encounter (INDEPENDENT_AMBULATORY_CARE_PROVIDER_SITE_OTHER): Payer: Self-pay | Admitting: Otolaryngology

## 2019-01-27 VITALS — Temp 97.7°F

## 2019-01-27 DIAGNOSIS — H6123 Impacted cerumen, bilateral: Secondary | ICD-10-CM | POA: Diagnosis not present

## 2019-01-27 NOTE — Progress Notes (Signed)
HPI: Isaiah Murphy is a 84 y.o. male who presents for evaluation of cerumen buildup in both ears.  He has slight blockage of his hearing.  No discomfort..  Past Medical History:  Diagnosis Date  . Acute blood loss anemia   . Allergic rhinitis   . Arthritis   . Constipation   . Dyslipidemia   . Dysrhythmia    history of paroxysmal A Fib and typical A Flutter   . GERD (gastroesophageal reflux disease)   . History of bronchitis   . History of sick sinus syndrome   . History of syncope   . HLD (hyperlipidemia)   . Hypertension   . Hyponatremia   . Leukocytosis   . Moderate persistent asthma with acute exacerbation in adult   . Presence of permanent cardiac pacemaker   . Primary osteoarthritis of right hip   . Sleep apnea    pt states not currently using CPAP machine   . Unsteady gait    Past Surgical History:  Procedure Laterality Date  . CARDIOVERSION N/A 04/28/2013   Procedure: CARDIOVERSION;  Surgeon: Lars MassonKatarina H Nelson, MD;  [facility];  Service: Cardiovascular;  Laterality: N/A;  . HEMORRHOID SURGERY    . left shoulder surgery      secondary to torn ligament / subluxation  . TEE WITHOUT CARDIOVERSION N/A 04/28/2013   Procedure: TRANSESOPHAGEAL ECHOCARDIOGRAM (TEE);  Surgeon: Lars MassonKatarina H Nelson, MD;  [facility];  Service: Cardiovascular;  Laterality: N/A;  . TOTAL HIP ARTHROPLASTY Right 03/08/2015   Procedure: RIGHT TOTAL HIP ARTHROPLASTY ANTERIOR APPROACH;  Surgeon: Ollen GrossFrank Aluisio, MD;  Location: [facility] ORS;  Service: Orthopedics;  Laterality: Right;   Social History   Socioeconomic History  . Marital status: Married    Spouse name: Not on file  . Number of children: Not on file  . Years of education: Not on file  . Highest education level: Not on file  Occupational History  . Not on file  Tobacco Use  . Smoking status: Never Smoker  . Smokeless tobacco: Never Used  Substance and Sexual Activity  . Alcohol use: Yes    Alcohol/week: 0.0 standard  drinks    Comment: socially   . Drug use: No  . Sexual activity: Not on file  Other Topics Concern  . 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 Difficulty of Paying Living Expenses: Not on file  Food Insecurity:   . Worried About Programme researcher, broadcasting/film/videounning Out of Food in the Last Year: Not on file  . Ran Out of Food in the Last Year: Not on file  Transportation Needs:   . Lack of Transportation (Medical): Not on file  . Lack of Transportation (Non-Medical): Not on file  Physical Activity:   . Days of Exercise per Week: Not on file  . Minutes of Exercise per Session: Not on file  Stress:   . Feeling of Stress : Not on file  Social Connections:   . Frequency of Communication with Friends and Family: Not on file  . Frequency of Social Gatherings with Friends and Family: Not on file  . Attends Religious Services: Not on file  . Active Member of Clubs or Organizations: Not on file  . Attends BankerClub or Organization Meetings: Not on file  . Marital Status: Not on file   Family History  Problem Relation Age of Onset  .  Hypertension Father   . Allergic rhinitis Neg Hx   . Asthma Neg Hx   . Eczema Neg Hx   . Immunodeficiency Neg Hx   . Urticaria Neg Hx    Allergies  Allergen Reactions  . Penicillins Rash    Has patient had a PCN reaction causing immediate rash, facial/tongue/throat swelling, SOB or lightheadedness with hypotension: Yes Has patient had a PCN reaction causing severe rash involving mucus membranes or skin necrosis: Yes Has patient had a PCN reaction that required hospitalization: No Has patient had a PCN reaction occurring within the last 10 years: No If all of the above answers are "NO", then may proceed with Cephalosporin use. Rash at the injection site   Prior to Admission medications   Medication Sig Start Date End Date Taking? Authorizing Provider  albuterol  (VENTOLIN HFA) 108 (90 Base) MCG/ACT inhaler Inhale 2 puffs into the lungs every 6 (six) hours as needed for wheezing or shortness of breath.   Yes [provider]  apixaban (ELIQUIS) 5 MG TABS tablet Take 1 tablet (5 mg total) by mouth 2 (two) times daily. 04/29/13  Yes Janetta Hora, PA-C  atorvastatin (LIPITOR) 20 MG tablet Take 20 mg by mouth at bedtime.    Yes [provider]  Azilsartan-Chlorthalidone (EDARBYCLOR) 40-12.5 MG TABS Take 1 tablet by mouth daily.   Yes [provider]  bisacodyl (DULCOLAX) 10 MG suppository Place 1 suppository (10 mg total) rectally daily as needed for moderate constipation. 03/11/15  Yes Perkins, Alexzandrew L, PA-C  ciclesonide (ALVESCO) 160 MCG/ACT inhaler Use one puff twice daily to prevent cough or wheeze.  Increase to 2 puffs twice daily with asthma flare. Rinse, gargle and spit after use 07/23/15  Yes Bhatti, Sokun, MD  diphenhydrAMINE (BENADRYL) 25 MG tablet Take 25 mg by mouth as needed.   Yes [provider]  esomeprazole (NEXIUM) 40 MG capsule Take 40 mg by mouth daily. Reported on 02/04/2015   Yes [provider]  fluticasone (FLONASE) 50 MCG/ACT nasal spray Place 2 sprays into both nostrils daily.   Yes [provider]  mometasone (NASONEX) 50 MCG/ACT nasal spray Place 2 sprays into the nose daily.    Yes [provider]  montelukast (SINGULAIR) 10 MG tablet Take 10 mg by mouth as needed.    Yes [provider]  olopatadine (PATANOL) 0.1 % ophthalmic solution Place 1 drop into both eyes daily.    Yes [provider]  polyethylene glycol (MIRALAX / GLYCOLAX) packet Take 17 g by mouth daily as needed for mild constipation. 03/11/15  Yes Perkins, Alexzandrew L, PA-C  sodium phosphate (FLEET) 7-19 GM/118ML ENEM Place 133 mLs (1 enema total) rectally once as needed for severe constipation. 03/11/15  Yes Perkins, Alexzandrew L, PA-C  triamcinolone cream (KENALOG) 0.1 % Apply 1  application topically 2 (two) times daily as needed (itching). Reported on 02/04/2015   Yes [provider]     Positive ROS: Otherwise negative  All other systems have been reviewed and were otherwise negative with the exception of those mentioned in the HPI and as above.  Physical Exam: Constitutional: Alert, well-appearing, no acute distress Ears: External ears without lesions or tenderness. Ear canals bilateral cerumen impactions right side slightly worse than left.  Earwax removed with suction and hydrogen peroxide.  TMs clear bilaterally.. Nasal: External nose without lesions. Clear nasal passages Oral: Oropharynx clear. Neck: No palpable adenopathy or masses Respiratory: Breathing comfortably  Skin: No facial/neck lesions or rash noted.  Cerumen impaction removal  Date/Time: 01/27/2019 3:01 PM Performed by: Rozetta Nunnery, MD Authorized by: Rozetta Nunnery, MD   Consent:    Consent obtained:  Verbal   Consent given by:  Patient   Risks discussed:  Pain and bleeding Procedure details:    Location:  L ear and R ear   Procedure type: suction and forceps   Post-procedure details:    Inspection:  TM intact and canal normal   Hearing quality:  Improved   Patient tolerance of procedure:  Tolerated well, no immediate complications    Assessment: Cerumen impactions  Plan: This was cleaned in the office he will follow-up as needed  Radene Journey, MD

## 2019-01-28 DIAGNOSIS — K080 Exfoliation of teeth due to systemic causes: Secondary | ICD-10-CM | POA: Diagnosis not present

## 2019-02-04 DIAGNOSIS — M25552 Pain in left hip: Secondary | ICD-10-CM | POA: Diagnosis not present

## 2019-02-10 ENCOUNTER — Telehealth: Payer: Self-pay

## 2019-02-10 NOTE — Telephone Encounter (Signed)
Pt states he has received his home remote monitor. I tried to help him over the phone but he did not want my help. He states he prefer to come into the office to get help. I made him a device clinic appointment for 02/20/2019.

## 2019-02-18 DIAGNOSIS — M25552 Pain in left hip: Secondary | ICD-10-CM | POA: Diagnosis not present

## 2019-02-20 ENCOUNTER — Ambulatory Visit (INDEPENDENT_AMBULATORY_CARE_PROVIDER_SITE_OTHER): Payer: Federal, State, Local not specified - PPO | Admitting: *Deleted

## 2019-02-20 ENCOUNTER — Other Ambulatory Visit: Payer: Self-pay

## 2019-02-20 DIAGNOSIS — I442 Atrioventricular block, complete: Secondary | ICD-10-CM

## 2019-02-20 NOTE — Patient Instructions (Signed)
Plug in monitor next to your bed when you get home.

## 2019-02-20 NOTE — Progress Notes (Signed)
Remote monitor set up and education completed and transmission received in [facility] system.

## 2019-02-24 ENCOUNTER — Telehealth: Payer: Self-pay

## 2019-02-24 NOTE — Telephone Encounter (Signed)
The pt had questions about his home remote monitor. I told him his monitor is a manual send. I told him the date in the window will not change until we get a full transmission. I told him his next remote date is 04/02/2019. I told him if we do not see it in the website someone will give him a call. The pt verbalized understanding and thanked me for the call.

## 2019-02-25 DIAGNOSIS — N5201 Erectile dysfunction due to arterial insufficiency: Secondary | ICD-10-CM | POA: Diagnosis not present

## 2019-02-25 DIAGNOSIS — R972 Elevated prostate specific antigen [PSA]: Secondary | ICD-10-CM | POA: Diagnosis not present

## 2019-04-02 ENCOUNTER — Ambulatory Visit (INDEPENDENT_AMBULATORY_CARE_PROVIDER_SITE_OTHER): Payer: Federal, State, Local not specified - PPO | Admitting: *Deleted

## 2019-04-02 DIAGNOSIS — I495 Sick sinus syndrome: Secondary | ICD-10-CM

## 2019-04-05 LAB — CUP PACEART REMOTE DEVICE CHECK
Battery Remaining Longevity: 37 mo
Battery Voltage: 2.97 V
Brady Statistic AP VP Percent: 95.22 %
Brady Statistic AP VS Percent: 0 %
Brady Statistic AS VP Percent: 4.78 %
Brady Statistic AS VS Percent: 0 %
Brady Statistic RA Percent Paced: 93.87 %
Brady Statistic RV Percent Paced: 99.92 %
Date Time Interrogation Session: 20210226220631
Implantable Lead Implant Date: 20150413
Implantable Lead Implant Date: 20150413
Implantable Lead Location: 753859
Implantable Lead Location: 753860
Implantable Lead Model: 5076
Implantable Lead Model: 5076
Implantable Pulse Generator Implant Date: 20150413
Lead Channel Impedance Value: 380 Ohm
Lead Channel Impedance Value: 513 Ohm
Lead Channel Impedance Value: 513 Ohm
Lead Channel Impedance Value: 589 Ohm
Lead Channel Pacing Threshold Amplitude: 0.75 V
Lead Channel Pacing Threshold Amplitude: 0.75 V
Lead Channel Pacing Threshold Pulse Width: 0.4 ms
Lead Channel Pacing Threshold Pulse Width: 0.4 ms
Lead Channel Sensing Intrinsic Amplitude: 1.625 mV
Lead Channel Sensing Intrinsic Amplitude: 1.625 mV
Lead Channel Sensing Intrinsic Amplitude: 13.875 mV
Lead Channel Sensing Intrinsic Amplitude: 13.875 mV
Lead Channel Setting Pacing Amplitude: 1.5 V
Lead Channel Setting Pacing Amplitude: 2.5 V
Lead Channel Setting Pacing Pulse Width: 0.4 ms
Lead Channel Setting Sensing Sensitivity: 0.9 mV

## 2019-07-02 ENCOUNTER — Ambulatory Visit (INDEPENDENT_AMBULATORY_CARE_PROVIDER_SITE_OTHER): Payer: Federal, State, Local not specified - PPO | Admitting: *Deleted

## 2019-07-02 DIAGNOSIS — I495 Sick sinus syndrome: Secondary | ICD-10-CM

## 2019-07-03 ENCOUNTER — Telehealth: Payer: Self-pay

## 2019-07-03 LAB — CUP PACEART REMOTE DEVICE CHECK
Battery Remaining Longevity: 33 mo
Battery Voltage: 2.96 V
Brady Statistic AP VP Percent: 96.74 %
Brady Statistic AP VS Percent: 0 %
Brady Statistic AS VP Percent: 3.26 %
Brady Statistic AS VS Percent: 0 %
Brady Statistic RA Percent Paced: 96.02 %
Brady Statistic RV Percent Paced: 99.93 %
Date Time Interrogation Session: 20210527121923
Implantable Lead Implant Date: 20150413
Implantable Lead Implant Date: 20150413
Implantable Lead Location: 753859
Implantable Lead Location: 753860
Implantable Lead Model: 5076
Implantable Lead Model: 5076
Implantable Pulse Generator Implant Date: 20150413
Lead Channel Impedance Value: 361 Ohm
Lead Channel Impedance Value: 494 Ohm
Lead Channel Impedance Value: 513 Ohm
Lead Channel Impedance Value: 570 Ohm
Lead Channel Pacing Threshold Amplitude: 0.75 V
Lead Channel Pacing Threshold Amplitude: 0.75 V
Lead Channel Pacing Threshold Pulse Width: 0.4 ms
Lead Channel Pacing Threshold Pulse Width: 0.4 ms
Lead Channel Sensing Intrinsic Amplitude: 1.625 mV
Lead Channel Sensing Intrinsic Amplitude: 1.625 mV
Lead Channel Sensing Intrinsic Amplitude: 14.5 mV
Lead Channel Sensing Intrinsic Amplitude: 14.5 mV
Lead Channel Setting Pacing Amplitude: 1.5 V
Lead Channel Setting Pacing Amplitude: 2.5 V
Lead Channel Setting Pacing Pulse Width: 0.4 ms
Lead Channel Setting Sensing Sensitivity: 0.9 mV

## 2019-07-03 NOTE — Telephone Encounter (Signed)
Spoke with patient to remind of missed remote transmission 

## 2019-07-04 NOTE — Progress Notes (Signed)
Remote pacemaker transmission.   

## 2019-08-06 DIAGNOSIS — H2511 Age-related nuclear cataract, right eye: Secondary | ICD-10-CM | POA: Diagnosis not present

## 2019-08-06 DIAGNOSIS — H524 Presbyopia: Secondary | ICD-10-CM | POA: Diagnosis not present

## 2019-08-06 DIAGNOSIS — H538 Other visual disturbances: Secondary | ICD-10-CM | POA: Diagnosis not present

## 2019-08-13 NOTE — Progress Notes (Signed)
Cardiology Office Note:    Date:  08/15/2019   ID:  Isaiah Murphy, DOB 02/29/1934, MRN [id number]  PCP:  Garnette Czech, MD  Cardiologist:  Lesleigh Noe, MD   Referring MD: Garnette Czech, MD   Chief Complaint  Patient presents with  . Atrial Fibrillation  . Hypertension  . Advice Only    To coagulation    History of Present Illness:    Isaiah Murphy is a 85 y.o. 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  Most recent LVEF by echocardiogram 2018 60%.  He is doing well.  He has no specific cardiac complaints.  He is plugged into the electrophysiology service for device follow-up, Dr. Ladona Ridgel.  He has not had palpitations, syncope, tachycardia, edema, orthopnea, angina, or claudication.  He denies neurological events.  He has not had bleeding on anticoagulation.  Past Medical History:  Diagnosis Date  . Acute blood loss anemia   . Allergic rhinitis   . Arthritis   . Constipation   . Dyslipidemia   . Dysrhythmia    history of paroxysmal A Fib and typical A Flutter   . GERD (gastroesophageal reflux disease)   . History of bronchitis   . History of sick sinus syndrome   . History of syncope   . HLD (hyperlipidemia)   . Hypertension   . Hyponatremia   . Leukocytosis   . Moderate persistent asthma with acute exacerbation in adult   . Presence of permanent cardiac pacemaker   . Primary osteoarthritis of right hip   . Sleep apnea    pt states not currently using CPAP machine   . Unsteady gait     Past Surgical History:  Procedure Laterality Date  . CARDIOVERSION N/A 04/28/2013   Procedure: CARDIOVERSION;  Surgeon: Lars Masson, MD;  [facility];  Service: Cardiovascular;  Laterality: N/A;  . HEMORRHOID SURGERY    . left shoulder surgery      secondary to torn ligament / subluxation  . TEE WITHOUT CARDIOVERSION N/A  04/28/2013   Procedure: TRANSESOPHAGEAL ECHOCARDIOGRAM (TEE);  Surgeon: Lars Masson, MD;  Location: [facility] ENDOSCOPY;  Service: Cardiovascular;  Laterality: N/A;  . TOTAL HIP ARTHROPLASTY Right 03/08/2015   Procedure: RIGHT TOTAL HIP ARTHROPLASTY ANTERIOR APPROACH;  Surgeon: Ollen Gross, MD;  Location: [facility] ORS;  Service: Orthopedics;  Laterality: Right;    Current Medications: Current Meds  Medication Sig  . albuterol (VENTOLIN HFA) 108 (90 Base) MCG/ACT inhaler Inhale 2 puffs into the lungs every 6 (six) hours as needed for wheezing or shortness of breath.  Marland Kitchen apixaban (ELIQUIS) 5 MG TABS tablet Take 1 tablet (5 mg total) by mouth 2 (two) times daily.  Marland Kitchen atorvastatin (LIPITOR) 20 MG tablet Take 20 mg by mouth at bedtime.   . Azilsartan-Chlorthalidone (EDARBYCLOR) 40-12.5 MG TABS Take 1 tablet by mouth daily.  . ciclesonide (ALVESCO) 160 MCG/ACT inhaler Use one puff twice daily to prevent cough or wheeze.  Increase to 2 puffs twice daily with asthma flare. Rinse, gargle and spit after use  . diphenhydrAMINE (BENADRYL) 25 MG tablet Take 25 mg by mouth as needed.  Marland Kitchen esomeprazole (NEXIUM) 40 MG capsule Take 40 mg by mouth daily. Reported on 02/04/2015  . fluticasone (FLONASE) 50 MCG/ACT nasal spray Place 2 sprays into both nostrils daily.  . montelukast (SINGULAIR) 10 MG tablet Take 10 mg by mouth as needed.   Marland Kitchen olopatadine (  PATANOL) 0.1 % ophthalmic solution Place 1 drop into both eyes daily.   . polyethylene glycol (MIRALAX / GLYCOLAX) packet Take 17 g by mouth daily as needed for mild constipation.  . triamcinolone cream (KENALOG) 0.1 % Apply 1 application topically 2 (two) times daily as needed (itching). Reported on 02/04/2015  . [DISCONTINUED] apixaban (ELIQUIS) 5 MG TABS tablet Take 1 tablet (5 mg total) by mouth 2 (two) times daily.  . [DISCONTINUED] Azilsartan-Chlorthalidone (EDARBYCLOR) 40-12.5 MG TABS Take 1 tablet by mouth daily.  . [DISCONTINUED] ciclesonide (ALVESCO) 160 MCG/ACT  inhaler Use one puff twice daily to prevent cough or wheeze.  Increase to 2 puffs twice daily with asthma flare. Rinse, gargle and spit after use     Allergies:   Penicillins   Social History   Socioeconomic History  . Marital status: Married    Spouse name: Not on file  . Number of children: Not on file  . Years of education: Not on file  . Highest education level: Not on file  Occupational History  . Not on file  Tobacco Use  . Smoking status: Never Smoker  . Smokeless tobacco: Never Used  Substance and Sexual Activity  . Alcohol use: Yes    Alcohol/week: 0.0 standard drinks    Comment: socially   . Drug use: No  . Sexual activity: Not on file  Other Topics Concern  . 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 Difficulty of Paying Living Expenses:   Food Insecurity:   . Worried About Programme researcher, broadcasting/film/video in the Last Year:   . Barista in the Last Year:   Transportation Needs:   . Freight forwarder (Medical):   Marland Kitchen Lack of Transportation (Non-Medical):   Physical Activity:   . Days of Exercise per Week:   . Minutes of Exercise per Session:   Stress:   . Feeling of Stress :   Social Connections:   . Frequency of Communication with Friends and Family:   . Frequency of Social Gatherings with Friends and Family:   . Attends Religious Services:   . Active Member of Clubs or Organizations:   . Attends Banker Meetings:   Marland Kitchen Marital Status:      Family History: The patient's family history includes Hypertension in his father. There is no history of Allergic rhinitis, Asthma, Eczema, Immunodeficiency, or Urticaria.  ROS:   Please see the history of present illness.    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  Having some difficulty with mobility  related to his back, knees, and hips.  All other systems reviewed and are negative.  EKGs/Labs/Other Studies Reviewed:    The following studies were reviewed today: Recent Paceart was reviewed.  EKG:  EKG demonstrates AV sequential pacing.  There is no change when compared to prior.  Rate is 83 bpm.  Recent Labs: No results found for requested labs within last 8760 hours.  Recent Lipid Panel    Component Value Date/Time   CHOL 108 04/26/2013 0100   TRIG 52 04/26/2013 0100   HDL 36 (L) 04/26/2013 0100   CHOLHDL 3.0 04/26/2013 0100   VLDL 10 04/26/2013 0100   LDLCALC 62 04/26/2013 0100    Physical Exam:    VS:  BP  120/84   Pulse 83   Ht 5' 9.5" (1.765 m)   Wt 192 lb 3.2 oz (87.2 kg)   SpO2 97%   BMI 27.98 kg/m     Wt Readings from Last 3 Encounters:  08/15/19 192 lb 3.2 oz (87.2 kg)  01/01/19 194 lb 6.4 oz (88.2 kg)  12/06/18 192 lb 9.6 oz (87.4 kg)     GEN: Compatible with age. No acute distress HEENT: Normal NECK: No JVD. LYMPHATICS: No lymphadenopathy CARDIAC:  RRR without murmur, gallop, or edema. VASCULAR:  Normal Pulses. No bruits. RESPIRATORY:  Clear to auscultation without rales, wheezing or rhonchi  ABDOMEN: Soft, non-tender, non-distended, No pulsatile mass, MUSCULOSKELETAL: No deformity  SKIN: Warm and dry NEUROLOGIC:  Alert and oriented x 3 PSYCHIATRIC:  Normal affect   ASSESSMENT:    1. Tachycardia-bradycardia syndrome (HCC)   2. Pacemaker   3. Complete heart block (HCC)   4. Essential hypertension   5. Atypical atrial flutter (HCC)   6. Dyslipidemia   7. Obstructive sleep apnea   8. Educated about COVID-19 virus infection    PLAN:    In order of problems listed above:  1. Asymptomatic.  Continue to monitor for palpitations, syncope, and prolonged tachycardia. 2. Normally functioning pacemaker.  Continue to follow in the EP clinic. 3. Treated with pacemaker 4. Blood pressure is excellent for age with target of 140/80 mmHg.  We did  discuss low-salt diet and exercise. 5. No comment 6. LDL cholesterol not available today no recent laboratory data.  He is on Lipitor 20 mg/day.  He needs to establish with a primary physician to manage other issues such as asthma and lipids. 7. Encouraged CPAP use. 8. He has been vaccinated.  He is practicing social distancing.     Medication Adjustments/Labs and Tests Ordered: Current medicines are reviewed at length with the patient today.  Concerns regarding medicines are outlined above.  Orders Placed This Encounter  Procedures  . Basic metabolic panel  . Hepatic function panel  . CBC  . EKG 12-Lead   Meds ordered this encounter  Medications  . apixaban (ELIQUIS) 5 MG TABS tablet    Sig: Take 1 tablet (5 mg total) by mouth 2 (two) times daily.    Dispense:  180 tablet    Refill:  3  . Azilsartan-Chlorthalidone (EDARBYCLOR) 40-12.5 MG TABS    Sig: Take 1 tablet by mouth daily.    Dispense:  90 tablet    Refill:  3  . ciclesonide (ALVESCO) 160 MCG/ACT inhaler    Sig: Use one puff twice daily to prevent cough or wheeze.  Increase to 2 puffs twice daily with asthma flare. Rinse, gargle and spit after use    Dispense:  1 Inhaler    Refill:  1    Patient needs an office visit    Patient Instructions  Medication Instructions:  Your physician recommends that you continue on your current medications as directed. Please refer to the Current Medication list given to you today.  *If you need a refill on your cardiac medications before your next appointment, please call your pharmacy*   Lab Work: BMET, CBC and Liver today  If you have labs (blood work) drawn today and your tests are completely normal, you will receive your results only by: Marland Kitchen MyChart Message (if you have MyChart) OR . A paper copy in the mail If you have any lab test that is abnormal or we need to change your treatment, we will call you  to review the results.   Testing/Procedures: None   Follow-Up: At  [facility], you and your health needs are our priority.  As part of our continuing mission to provide you with exceptional heart care, we have created designated Provider Care Teams.  These Care Teams include your primary Cardiologist (physician) and Advanced Practice Providers (APPs -  Physician Assistants and Nurse Practitioners) who all work together to provide you with the care you need, when you need it.  We recommend signing up for the patient portal called "MyChart".  Sign up information is provided on this After Visit Summary.  MyChart is used to connect with patients for Virtual Visits (Telemedicine).  Patients are able to view lab/test results, encounter notes, upcoming appointments, etc.  Non-urgent messages can be sent to your provider as well.   To learn more about what you can do with MyChart, go to [email or web address].    Your next appointment:   9-12 month(s)  The format for your next appointment:   In Person  Provider:   You may see Lesleigh Noe, MD or one of the following Advanced Practice Providers on your designated Care Team:    Norma Fredrickson, NP  Nada Boozer, NP  Georgie Chard, NP    Other Instructions      Signed, Lesleigh Noe, MD  08/15/2019 10:11 AM    [facility]

## 2019-08-15 ENCOUNTER — Other Ambulatory Visit: Payer: Self-pay

## 2019-08-15 ENCOUNTER — Encounter: Payer: Self-pay | Admitting: Interventional Cardiology

## 2019-08-15 ENCOUNTER — Ambulatory Visit (INDEPENDENT_AMBULATORY_CARE_PROVIDER_SITE_OTHER): Payer: Medicare Other | Admitting: Interventional Cardiology

## 2019-08-15 VITALS — BP 120/84 | HR 83 | Ht 69.5 in | Wt 192.2 lb

## 2019-08-15 DIAGNOSIS — I495 Sick sinus syndrome: Secondary | ICD-10-CM

## 2019-08-15 DIAGNOSIS — I484 Atypical atrial flutter: Secondary | ICD-10-CM | POA: Diagnosis not present

## 2019-08-15 DIAGNOSIS — I442 Atrioventricular block, complete: Secondary | ICD-10-CM | POA: Diagnosis not present

## 2019-08-15 DIAGNOSIS — I10 Essential (primary) hypertension: Secondary | ICD-10-CM

## 2019-08-15 DIAGNOSIS — Z950 Presence of cardiac pacemaker: Secondary | ICD-10-CM

## 2019-08-15 DIAGNOSIS — E785 Hyperlipidemia, unspecified: Secondary | ICD-10-CM

## 2019-08-15 DIAGNOSIS — G4733 Obstructive sleep apnea (adult) (pediatric): Secondary | ICD-10-CM

## 2019-08-15 DIAGNOSIS — Z7189 Other specified counseling: Secondary | ICD-10-CM

## 2019-08-15 LAB — CBC
Hematocrit: 44.6 % (ref 37.5–51.0)
Hemoglobin: 15.6 g/dL (ref 13.0–17.7)
MCH: 31.7 pg (ref 26.6–33.0)
MCHC: 35 g/dL (ref 31.5–35.7)
MCV: 91 fL (ref 79–97)
Platelets: 212 10*3/uL (ref 150–450)
RBC: 4.92 x10E6/uL (ref 4.14–5.80)
RDW: 12.5 % (ref 11.6–15.4)
WBC: 7.2 10*3/uL (ref 3.4–10.8)

## 2019-08-15 LAB — BASIC METABOLIC PANEL
BUN/Creatinine Ratio: 11 (ref 10–24)
BUN: 15 mg/dL (ref 8–27)
CO2: 25 mmol/L (ref 20–29)
Calcium: 9 mg/dL (ref 8.6–10.2)
Chloride: 96 mmol/L (ref 96–106)
Creatinine, Ser: 1.41 mg/dL — ABNORMAL HIGH (ref 0.76–1.27)
GFR calc Af Amer: 53 mL/min/{1.73_m2} — ABNORMAL LOW (ref >59)
GFR calc non Af Amer: 46 mL/min/{1.73_m2} — ABNORMAL LOW (ref >59)
Glucose: 96 mg/dL (ref 65–99)
Potassium: 4.2 mmol/L (ref 3.5–5.2)
Sodium: 133 mmol/L — ABNORMAL LOW (ref 134–144)

## 2019-08-15 LAB — HEPATIC FUNCTION PANEL
ALT: 18 IU/L (ref 0–44)
AST: 26 IU/L (ref 0–40)
Albumin: 4.3 g/dL (ref 3.6–4.6)
Alkaline Phosphatase: 130 IU/L — ABNORMAL HIGH (ref 48–121)
Bilirubin Total: 0.9 mg/dL (ref 0.0–1.2)
Bilirubin, Direct: 0.27 mg/dL (ref 0.00–0.40)
Total Protein: 7.4 g/dL (ref 6.0–8.5)

## 2019-08-15 MED ORDER — APIXABAN 5 MG PO TABS: 5.0000 mg | ORAL_TABLET | Freq: Two times a day (BID) | ORAL | 3 refills | Status: DC

## 2019-08-15 MED ORDER — CICLESONIDE 160 MCG/ACT IN AERS: INHALATION_SPRAY | RESPIRATORY_TRACT | 1 refills | Status: DC

## 2019-08-15 MED ORDER — EDARBYCLOR 40-12.5 MG PO TABS: 1.0000 | ORAL_TABLET | Freq: Every day | ORAL | 3 refills | Status: DC

## 2019-08-15 NOTE — Patient Instructions (Signed)
Medication Instructions:  Your physician recommends that you continue on your current medications as directed. Please refer to the Current Medication list given to you today.  *If you need a refill on your cardiac medications before your next appointment, please call your pharmacy*   Lab Work: BMET, CBC and Liver today  If you have labs (blood work) drawn today and your tests are completely normal, you will receive your results only by: Marland Kitchen MyChart Message (if you have MyChart) OR . A paper copy in the mail If you have any lab test that is abnormal or we need to change your treatment, we will call you to review the results.   Testing/Procedures: None   Follow-Up: At [facility], you and your health needs are our priority.  As part of our continuing mission to provide you with exceptional heart care, we have created designated Provider Care Teams.  These Care Teams include your primary Cardiologist (physician) and Advanced Practice Providers (APPs -  Physician Assistants and Nurse Practitioners) who all work together to provide you with the care you need, when you need it.  We recommend signing up for the patient portal called "MyChart".  Sign up information is provided on this After Visit Summary.  MyChart is used to connect with patients for Virtual Visits (Telemedicine).  Patients are able to view lab/test results, encounter notes, upcoming appointments, etc.  Non-urgent messages can be sent to your provider as well.   To learn more about what you can do with MyChart, go to [email or web address].    Your next appointment:   9-12 month(s)  The format for your next appointment:   In Person  Provider:   You may see Lesleigh Noe, MD or one of the following Advanced Practice Providers on your designated Care Team:    Norma Fredrickson, NP  Nada Boozer, NP  Georgie Chard, NP    Other Instructions

## 2019-10-20 ENCOUNTER — Ambulatory Visit (INDEPENDENT_AMBULATORY_CARE_PROVIDER_SITE_OTHER): Payer: Medicare Other | Admitting: *Deleted

## 2019-10-20 DIAGNOSIS — I495 Sick sinus syndrome: Secondary | ICD-10-CM

## 2019-10-22 LAB — CUP PACEART REMOTE DEVICE CHECK
Battery Remaining Longevity: 29 mo
Battery Voltage: 2.96 V
Brady Statistic AP VP Percent: 97.52 %
Brady Statistic AP VS Percent: 0 %
Brady Statistic AS VP Percent: 2.48 %
Brady Statistic AS VS Percent: 0 %
Brady Statistic RA Percent Paced: 97.03 %
Brady Statistic RV Percent Paced: 99.93 %
Date Time Interrogation Session: 20210913084509
Implantable Lead Implant Date: 20150413
Implantable Lead Implant Date: 20150413
Implantable Lead Location: 753859
Implantable Lead Location: 753860
Implantable Lead Model: 5076
Implantable Lead Model: 5076
Implantable Pulse Generator Implant Date: 20150413
Lead Channel Impedance Value: 380 Ohm
Lead Channel Impedance Value: 494 Ohm
Lead Channel Impedance Value: 513 Ohm
Lead Channel Impedance Value: 570 Ohm
Lead Channel Pacing Threshold Amplitude: 0.625 V
Lead Channel Pacing Threshold Amplitude: 0.75 V
Lead Channel Pacing Threshold Pulse Width: 0.4 ms
Lead Channel Pacing Threshold Pulse Width: 0.4 ms
Lead Channel Sensing Intrinsic Amplitude: 0.75 mV
Lead Channel Sensing Intrinsic Amplitude: 0.75 mV
Lead Channel Sensing Intrinsic Amplitude: 14.5 mV
Lead Channel Sensing Intrinsic Amplitude: 14.5 mV
Lead Channel Setting Pacing Amplitude: 1.5 V
Lead Channel Setting Pacing Amplitude: 2.5 V
Lead Channel Setting Pacing Pulse Width: 0.4 ms
Lead Channel Setting Sensing Sensitivity: 0.9 mV

## 2019-10-22 NOTE — Progress Notes (Signed)
Remote pacemaker transmission.   

## 2019-10-28 ENCOUNTER — Telehealth: Payer: Self-pay | Admitting: *Deleted

## 2019-10-28 NOTE — Telephone Encounter (Signed)
   Primary Cardiologist: Lesleigh Noe, MD  Chart reviewed and patient contacted today by phone as part of pre-operative protocol coverage. Given past medical history and time since last visit, based on ACC/AHA guidelines, Isaiah Murphy would be at acceptable risk for the planned procedure without further cardiovascular testing.   Ok to hold Eliquis three days pre op.   The patient was advised that if he develops new symptoms prior to surgery to contact our office to arrange for a follow-up visit, and he verbalized understanding.  I will route this recommendation to the requesting party via Epic fax function and remove from pre-op pool.  Please call with questions.  Corine Shelter, PA-C 10/28/2019, 3:38 PM

## 2019-10-28 NOTE — Telephone Encounter (Signed)
Pharmacy please address anticoagulation and then I will contact the patient for clearance.   Corine Shelter PA-C 10/28/2019 1:48 PM

## 2019-10-28 NOTE — Telephone Encounter (Signed)
Patient with diagnosis of afib on Eliquis for anticoagulation.    Procedure: left THA Date of procedure: 01/07/20  CHADS2-VASc score of 3 (age x2, HTN)  CrCl 84mL/min Platelet count 212K  Per office protocol, patient can hold Eliquis for 3 days prior to procedure.

## 2019-10-28 NOTE — Telephone Encounter (Signed)
   [facility] Pre-operative Risk Assessment    HEARTCARE STAFF: - Please ensure there is not already an duplicate clearance open for this procedure. - Under Visit Info/Reason for Call, type in Other and utilize the format Clearance MM/DD/YY or Clearance TBD. Do not use dashes or single digits. - If request is for dental extraction, please clarify the # of teeth to be extracted.  Request for surgical clearance:  1. What type of surgery is being performed? LEFT TOTAL HIP ARTHROPLASTY  2. When is this surgery scheduled? 01/07/20  3. What type of clearance is required (medical clearance vs. Pharmacy clearance to hold med vs. Both)? BOTH  4. Are there any medications that need to be held prior to surgery and how long? EILQUIS  5. Practice name and name of physician performing surgery? [facility]; DR. FRANK ALUISIO  6. What is the office phone number? [phone]    7.   What is the office fax number? [phone] ATTN: Blaine  8.   Anesthesia type (None, local, MAC, general) ? CHOICE   Julaine Hua 10/28/2019, 10:15 AM  _________________________________________________________________   (provider comments below)

## 2019-11-28 ENCOUNTER — Telehealth: Payer: Self-pay | Admitting: Interventional Cardiology

## 2019-11-28 NOTE — Telephone Encounter (Signed)
Pharmacy, please comment on how long patient can hold Eliquis for upcoming procedure?  Thank you! 

## 2019-11-28 NOTE — Telephone Encounter (Signed)
Patient with diagnosis of a fib/a flutter on Eliquis for anticoagulation.    Procedure: Left Hip Replacement Date of procedure: 01/07/20    :[id number]}  CHA2DS2-VASc Score = 3  This indicates a 3.2% annual risk of stroke. The patient's score is based upon: CHF History: 0 HTN History: 1 Diabetes History: 0 Stroke History: 0 Vascular Disease History: 0 Age Score: 2 Gender Score: 0   CrCl 14ml/min Platelet count 212K  Per office protocol, patient can hold Eliquis for 3 days prior to procedure.    Patient will not need bridging with Lovenox (enoxaparin) around procedure.  If not bridging, patient should restart Eliquis on the evening of procedure or day after, at discretion of procedure MD  For orthopedic procedures please be sure to resume therapeutic (not prophylactic) dosing.

## 2019-11-28 NOTE — Telephone Encounter (Signed)
    [facility] Pre-operative Risk Assessment    Request for surgical clearance:  1. What type of surgery is being performed?   Left Hip Replacement  2. When is this surgery scheduled?  December 1st 2021  3. What type of clearance is required (medical clearance vs. Pharmacy clearance to hold med vs. Both)? Patient unsure, but he believes both   4. Are there any medications that need to be held prior to surgery and how long?  Patient unsure  5. Practice name and name of physician performing surgery? Dr. Wynelle Link and [facility]   6. What is your office phone number  [phone]   7.   What is your office fax number Unknown   8.   Anesthesia type (None, local, MAC, general) ?  general   Jobe Gibbon 11/28/2019, 12:03 PM  _________________________________________________________________   (provider comments below)  Patient called in regards to obtaining clearance for surgery, says Dr. Anne Fu office said he needed to obtain the clearance.

## 2019-12-01 NOTE — Telephone Encounter (Signed)
   Primary Cardiologist: Lesleigh Noe, MD  Chart reviewed as part of pre-operative protocol coverage. Patient last seen 08/15/19 by Dr Katrinka Blazing and was stable from a cardiac perspective. Since the visit he denies any new cardiac symptoms. No chest pain, sob, LLE, palpitations, or orthopnea. Activity is limited by his hip pain. Given past medical history and time since last visit, based on ACC/AHA guidelines, Isaiah Murphy would be at acceptable risk for the planned procedure without further cardiovascular testing.   Per pharmacy recommendations it is okay to hold Eliquis 3 days prior to the procedure. Patient will not need bridging with Lovenox. If not bridging, patient should restart Eliquis on the evening of procedure or day after, at discretion of procedure MD. For orthopedic procedures please be sure to resume therapeutic (not prophylactic) dosing.  The patient was advised that if he develops new symptoms prior to surgery to contact our office to arrange for a follow-up visit, and he verbalized understanding.  I will route this recommendation to the requesting party via Epic fax function and remove from pre-op pool.  Please call with questions.  Alejandro Adcox David Stall, PA-C 12/01/2019, 3:32 PM

## 2019-12-01 NOTE — Telephone Encounter (Signed)
In regards to cardiac pre-op assessment.   Attempted to call patient at his home and mobile phone with no answer. Unable to leave messages on either. Will attempt to call again tomorrow.   Spruha Weight Fransico Michael, PA-C

## 2019-12-29 ENCOUNTER — Encounter: Payer: Self-pay | Admitting: Internal Medicine

## 2019-12-29 NOTE — Progress Notes (Addendum)
COVID Vaccine Completed:  x3 Date COVID Vaccine completed:  02-27-19 & 03-20-19 12-07-19 Booster  COVID vaccine manufacturer: Pfizer    Moderna   [employer]   PCP - Timothy Lasso, MD Cardiologist - Verdis Prime, MD  Cardiac clearance on chart dated 12-01-19 by Cadence Fransico Michael, PA-C  Chest x-ray -  EKG - 08-15-19 in Epic Stress Test - 05-06-13 in Epic ECHO - 07-28-16 in Epic Cardiac Cath -  Pacemaker/ICD device last checked:  10-20-19  Sleep Study - 02-23-18 in Epic, +mod sleep apnea CPAP -  Yes  Fasting Blood Sugar -  Checks Blood Sugar _____ times a day  Blood Thinner Instructions:  Eliquis 5 mg.  Okay to hold 3 days prior to procedure per clearance in Epic dated 12-01-19.  Pt aware of when to hold  Aspirin Instructions: Last Dose:  Anesthesia review:  Aflutter, Tachycardia-bradycardia syndrome, HTN.  Pacemaker, complete heart block.  OSA and hx of syncopal episodes  Patient denies shortness of breath, fever, cough and chest pain at PAT appointment.  Able to slowly climb stairs due to hip pain.  Able to perform ADL's without assistance.   Patient verbalized understanding of instructions that were given to them at the PAT appointment. Patient was also instructed that they will need to review over the PAT instructions again at home before surgery.

## 2019-12-29 NOTE — Progress Notes (Signed)
PERIOPERATIVE PRESCRIPTION FOR IMPLANTED CARDIAC DEVICE PROGRAMMING  Patient Information: Name:  Isaiah Murphy  DOB:  26-Jul-1934  MRN:  [id number]    Planned Procedure: L hip arthroplasty  Surgeon:  Dr. Lequita Halt  Date of Procedure: 01-07-20  Cautery will be used.  Position during surgery:    Please send documentation back to:  [facility] (Fax # [phone])   Fransico Him, RN  12/29/2019 10:35 AM   Device Information:  Clinic EP Physician:  Lewayne Bunting, MD   Device Type:  Pacemaker Manufacturer and Phone #:  Medtronic: [id number] Pacemaker Dependent?:  Yes.   Date of Last Device Check:  10/20/19 (remote) 01/01/19 (in-clinic) Normal Device Function?:  Yes.    Electrophysiologist's Recommendations:   Have magnet available.  Provide continuous ECG monitoring when magnet is used or reprogramming is to be performed.   Procedure should not interfere with device function.  No device programming or magnet placement needed.  Per [facility] [address], Linton Ham, RN  11:32 AM 12/29/2019

## 2019-12-29 NOTE — Patient Instructions (Addendum)
DUE TO COVID-19 ONLY ONE VISITOR IS ALLOWED TO COME WITH YOU AND STAY IN THE WAITING ROOM ONLY DURING PRE OP AND PROCEDURE.   IF YOU WILL BE ADMITTED INTO THE HOSPITAL YOU ARE ALLOWED ONE SUPPORT PERSON DURING VISITATION HOURS ONLY (10AM -8PM)   . The support person may change daily. . The support person must pass our screening, gel in and out, and wear a mask at all times, including in the patient's room. . Patients must also wear a mask when staff or their support person are in the room.   COVID SWAB TESTING MUST BE COMPLETED ON:  Saturday, 01-03-20 @ 9:20 AM   [address] [place], [place] [address]  (Must self quarantine after testing. Follow instructions on handout.)    Your procedure is scheduled on:  Wednesday, 01-07-20   Report to [facility] Main  Entrance   Report to admitting at 6:30 AM   Call this number if you have problems the morning of surgery [phone]    Bring CPAP mask and tubing day of surgery.   Do not eat food :After Midnight.   May have liquids until 5:30 AM day of surgery  CLEAR LIQUID DIET  Foods Allowed                                                                     Foods Excluded  Water, Black Coffee and tea, regular and decaf               liquids that you cannot  Plain Jell-O in any flavor  (No red)                                      see through such as: Fruit ices (not with fruit pulp)                                      milk, soups, orange juice              Iced Popsicles (No red)                                      All solid food                                   Apple juices Sports drinks like Gatorade (No red) Lightly seasoned clear broth or consume(fat free) Sugar, honey syrup     Complete one Ensure drink the morning of surgery at  5:30 AM the day of surgery.     Oral Hygiene is also important to reduce your risk of infection.                                    Remember - BRUSH YOUR TEETH THE MORNING OF SURGERY WITH  YOUR REGULAR TOOTHPASTE   Do  NOT smoke after Midnight   Take these medicines the morning of surgery with A SIP OF WATER:  Nexium, Singulair, Okay to use inhalers                               You may not have any metal on your body including  jewelry, and body piercings             Do not wear  lotions, powders, perfumes/cologne, or deodorant             Men may shave face and neck.   Do not bring valuables to the hospital. [facility] IS NOT RESPONSIBLE   FOR VALUABLES.   Contacts, dentures or bridgework may not be worn into surgery.   Bring small overnight bag day of surgery.                 Please read over the following fact sheets you were given: IF YOU HAVE QUESTIONS ABOUT YOUR PRE OP INSTRUCTIONS PLEASE CALL [phone]   [place] - Preparing for Surgery Before surgery, you can play an important role.  Because skin is not sterile, your skin needs to be as free of germs as possible.  You can reduce the number of germs on your skin by washing with CHG (chlorahexidine gluconate) soap before surgery.  CHG is an antiseptic cleaner which kills germs and bonds with the skin to continue killing germs even after washing. Please DO NOT use if you have an allergy to CHG or antibacterial soaps.  If your skin becomes reddened/irritated stop using the CHG and inform your nurse when you arrive at Short Stay. Do not shave (including legs and underarms) for at least 48 hours prior to the first CHG shower.  You may shave your face/neck.  Please follow these instructions carefully:  1.  Shower with CHG Soap the night before surgery and the  morning of surgery.  2.  If you choose to wash your hair, wash your hair first as usual with your normal  shampoo.  3.  After you shampoo, rinse your hair and body thoroughly to remove the shampoo.                             4.  Use CHG as you would any other liquid soap.  You can apply chg directly to the skin and wash.  Gently with a scrungie or clean  washcloth.  5.  Apply the CHG Soap to your body ONLY FROM THE NECK DOWN.   Do   not use on face/ open                           Wound or open sores. Avoid contact with eyes, ears mouth and   genitals (private parts).                       Wash face,  Genitals (private parts) with your normal soap.             6.  Wash thoroughly, paying special attention to the area where your    surgery  will be performed.  7.  Thoroughly rinse your body with warm water from the neck down.  8.  DO NOT shower/wash with your normal soap after using and rinsing off the CHG Soap.  9.  Pat yourself dry with a clean towel.            10.  Wear clean pajamas.            11.  Place clean sheets on your bed the night of your first shower and do not  sleep with pets. Day of Surgery : Do not apply any lotions/deodorants the morning of surgery.  Please wear clean clothes to the hospital/surgery center.  FAILURE TO FOLLOW THESE INSTRUCTIONS MAY RESULT IN THE CANCELLATION OF YOUR SURGERY  PATIENT SIGNATURE_________________________________  NURSE SIGNATURE__________________________________  ________________________________________________________________________   Adam Phenix  An incentive spirometer is a tool that can help keep your lungs clear and active. This tool measures how well you are filling your lungs with each breath. Taking long deep breaths may help reverse or decrease the chance of developing breathing (pulmonary) problems (especially infection) following:  A long period of time when you are unable to move or be active. BEFORE THE PROCEDURE   If the spirometer includes an indicator to show your best effort, your nurse or respiratory therapist will set it to a desired goal.  If possible, sit up straight or lean slightly forward. Try not to slouch.  Hold the incentive spirometer in an upright position. INSTRUCTIONS FOR USE  1. Sit on the edge of your bed if possible, or sit up  as far as you can in bed or on a chair. 2. Hold the incentive spirometer in an upright position. 3. Breathe out normally. 4. Place the mouthpiece in your mouth and seal your lips tightly around it. 5. Breathe in slowly and as deeply as possible, raising the piston or the ball toward the top of the column. 6. Hold your breath for 3-5 seconds or for as long as possible. Allow the piston or ball to fall to the bottom of the column. 7. Remove the mouthpiece from your mouth and breathe out normally. 8. Rest for a few seconds and repeat Steps 1 through 7 at least 10 times every 1-2 hours when you are awake. Take your time and take a few normal breaths between deep breaths. 9. The spirometer may include an indicator to show your best effort. Use the indicator as a goal to work toward during each repetition. 10. After each set of 10 deep breaths, practice coughing to be sure your lungs are clear. If you have an incision (the cut made at the time of surgery), support your incision when coughing by placing a pillow or rolled up towels firmly against it. Once you are able to get out of bed, walk around indoors and cough well. You may stop using the incentive spirometer when instructed by your caregiver.  RISKS AND COMPLICATIONS  Take your time so you do not get dizzy or light-headed.  If you are in pain, you may need to take or ask for pain medication before doing incentive spirometry. It is harder to take a deep breath if you are having pain. AFTER USE  Rest and breathe slowly and easily.  It can be helpful to keep track of a log of your progress. Your caregiver can provide you with a simple table to help with this. If you are using the spirometer at home, follow these instructions: [place] IF:   You are having difficultly using the spirometer.  You have trouble using the spirometer as often as instructed.  Your pain medication is not giving enough relief while using the  spirometer.  You develop fever of 100.5 F (38.1 C) or higher. SEEK IMMEDIATE MEDICAL CARE IF:   You cough up bloody sputum that had not been present before.  You develop fever of 102 F (38.9 C) or greater.  You develop worsening pain at or near the incision site. MAKE SURE YOU:   Understand these instructions.  Will watch your condition.  Will get help right away if you are not doing well or get worse. Document Released: 06/05/2006 Document Revised: 04/17/2011 Document Reviewed: 08/06/2006 Exit[employer] Patient Information 2014 ExitCare, [place].   ________________________________________________________________________  WHAT IS A BLOOD TRANSFUSION? Blood Transfusion Information  A transfusion is the replacement of blood or some of its parts. Blood is made up of multiple cells which provide different functions.  Red blood cells carry oxygen and are used for blood loss replacement.  White blood cells fight against infection.  Platelets control bleeding.  Plasma helps clot blood.  Other blood products are available for specialized needs, such as hemophilia or other clotting disorders. BEFORE THE TRANSFUSION  Who gives blood for transfusions?   Healthy volunteers who are fully evaluated to make sure their blood is safe. This is blood bank blood. Transfusion therapy is the safest it has ever been in the practice of medicine. Before blood is taken from a donor, a complete history is taken to make sure that person has no history of diseases nor engages in risky social behavior (examples are intravenous drug use or sexual activity with multiple partners). The donor's travel history is screened to minimize risk of transmitting infections, such as malaria. The donated blood is tested for signs of infectious diseases, such as HIV and hepatitis. The blood is then tested to be sure it is compatible with you in order to minimize the chance of a transfusion reaction. If you or a relative  donates blood, this is often done in anticipation of surgery and is not appropriate for emergency situations. It takes many days to process the donated blood. RISKS AND COMPLICATIONS Although transfusion therapy is very safe and saves many lives, the main dangers of transfusion include:   Getting an infectious disease.  Developing a transfusion reaction. This is an allergic reaction to something in the blood you were given. Every precaution is taken to prevent this. The decision to have a blood transfusion has been considered carefully by your caregiver before blood is given. Blood is not given unless the benefits outweigh the risks. AFTER THE TRANSFUSION  Right after receiving a blood transfusion, you will usually feel much better and more energetic. This is especially true if your red blood cells have gotten low (anemic). The transfusion raises the level of the red blood cells which carry oxygen, and this usually causes an energy increase.  The nurse administering the transfusion will monitor you carefully for complications. HOME CARE INSTRUCTIONS  No special instructions are needed after a transfusion. You may find your energy is better. Speak with your caregiver about any limitations on activity for underlying diseases you may have. SEEK MEDICAL CARE IF:   Your condition is not improving after your transfusion.  You develop redness or irritation at the intravenous (IV) site. SEEK IMMEDIATE MEDICAL CARE IF:  Any of the following symptoms occur over the next 12 hours:  Shaking chills.  You have a temperature by mouth above 102 F (38.9 C), not controlled by medicine.  Chest, back, or muscle pain.  People around you feel you are not acting correctly or are confused.  Shortness of  breath or difficulty breathing.  Dizziness and fainting.  You get a rash or develop hives.  You have a decrease in urine output.  Your urine turns a dark color or changes to pink, red, or brown. Any  of the following symptoms occur over the next 10 days:  You have a temperature by mouth above 102 F (38.9 C), not controlled by medicine.  Shortness of breath.  Weakness after normal activity.  The white part of the eye turns yellow (jaundice).  You have a decrease in the amount of urine or are urinating less often.  Your urine turns a dark color or changes to pink, red, or brown. Document Released: 01/21/2000 Document Revised: 04/17/2011 Document Reviewed: 09/09/2007 [facility] Patient Information 2014 [place], [place].  _______________________________________________________________________

## 2019-12-30 ENCOUNTER — Encounter: Payer: Self-pay | Admitting: Allergy and Immunology

## 2019-12-30 ENCOUNTER — Encounter (INDEPENDENT_AMBULATORY_CARE_PROVIDER_SITE_OTHER): Payer: Self-pay

## 2019-12-30 ENCOUNTER — Ambulatory Visit (INDEPENDENT_AMBULATORY_CARE_PROVIDER_SITE_OTHER): Payer: Medicare Other | Admitting: Allergy and Immunology

## 2019-12-30 ENCOUNTER — Other Ambulatory Visit: Payer: Self-pay

## 2019-12-30 ENCOUNTER — Encounter (HOSPITAL_COMMUNITY)
Admission: RE | Admit: 2019-12-30 | Discharge: 2019-12-30 | Disposition: A | Discharge status: Home / Self Care | Payer: Medicare Other | Source: Ambulatory Visit | Attending: Orthopedic Surgery | Admitting: Orthopedic Surgery

## 2019-12-30 ENCOUNTER — Ambulatory Visit: Payer: Self-pay | Admitting: Allergy and Immunology

## 2019-12-30 ENCOUNTER — Encounter (HOSPITAL_COMMUNITY): Payer: Self-pay

## 2019-12-30 VITALS — BP 140/80 | HR 75 | Temp 97.0°F | Resp 14 | Ht 69.0 in | Wt 191.8 lb

## 2019-12-30 DIAGNOSIS — Z79899 Other long term (current) drug therapy: Secondary | ICD-10-CM | POA: Diagnosis not present

## 2019-12-30 DIAGNOSIS — J4530 Mild persistent asthma, uncomplicated: Secondary | ICD-10-CM | POA: Diagnosis not present

## 2019-12-30 DIAGNOSIS — I480 Paroxysmal atrial fibrillation: Secondary | ICD-10-CM | POA: Insufficient documentation

## 2019-12-30 DIAGNOSIS — I10 Essential (primary) hypertension: Secondary | ICD-10-CM | POA: Insufficient documentation

## 2019-12-30 DIAGNOSIS — Z950 Presence of cardiac pacemaker: Secondary | ICD-10-CM | POA: Insufficient documentation

## 2019-12-30 DIAGNOSIS — Z01812 Encounter for preprocedural laboratory examination: Secondary | ICD-10-CM | POA: Insufficient documentation

## 2019-12-30 DIAGNOSIS — J3089 Other allergic rhinitis: Secondary | ICD-10-CM | POA: Diagnosis not present

## 2019-12-30 DIAGNOSIS — M1612 Unilateral primary osteoarthritis, left hip: Secondary | ICD-10-CM | POA: Insufficient documentation

## 2019-12-30 DIAGNOSIS — G4730 Sleep apnea, unspecified: Secondary | ICD-10-CM | POA: Insufficient documentation

## 2019-12-30 DIAGNOSIS — Z7901 Long term (current) use of anticoagulants: Secondary | ICD-10-CM | POA: Diagnosis not present

## 2019-12-30 LAB — SURGICAL PCR SCREEN
MRSA, PCR: NEGATIVE
Staphylococcus aureus: NEGATIVE

## 2019-12-30 LAB — COMPREHENSIVE METABOLIC PANEL
ALT: 22 U/L (ref 0–44)
AST: 24 U/L (ref 15–41)
Albumin: 4.1 g/dL (ref 3.5–5.0)
Alkaline Phosphatase: 90 U/L (ref 38–126)
Anion gap: 8 (ref 5–15)
BUN: 17 mg/dL (ref 8–23)
CO2: 27 mmol/L (ref 22–32)
Calcium: 8.7 mg/dL — ABNORMAL LOW (ref 8.9–10.3)
Chloride: 99 mmol/L (ref 98–111)
Creatinine, Ser: 0.84 mg/dL (ref 0.61–1.24)
GFR, Estimated: >60 mL/min (ref >60)
Glucose, Bld: 93 mg/dL (ref 70–99)
Potassium: 4 mmol/L (ref 3.5–5.1)
Sodium: 134 mmol/L — ABNORMAL LOW (ref 135–145)
Total Bilirubin: 1.2 mg/dL (ref 0.3–1.2)
Total Protein: 7.6 g/dL (ref 6.5–8.1)

## 2019-12-30 LAB — CBC
HCT: 43 % (ref 39.0–52.0)
Hemoglobin: 14.9 g/dL (ref 13.0–17.0)
MCH: 32.9 pg (ref 26.0–34.0)
MCHC: 34.7 g/dL (ref 30.0–36.0)
MCV: 94.9 fL (ref 80.0–100.0)
Platelets: 200 10*3/uL (ref 150–400)
RBC: 4.53 MIL/uL (ref 4.22–5.81)
RDW: 12.7 % (ref 11.5–15.5)
WBC: 6.9 10*3/uL (ref 4.0–10.5)
nRBC: 0 % (ref 0.0–0.2)

## 2019-12-30 LAB — APTT: aPTT: 35 seconds (ref 24–36)

## 2019-12-30 LAB — PROTIME-INR
INR: 1.1 (ref 0.8–1.2)
Prothrombin Time: 14 seconds (ref 11.4–15.2)

## 2019-12-30 NOTE — Patient Instructions (Addendum)
  1.  Continue Alvesco 160 - 2 inhalations 1-2 times per day depending on disease activity  2.  Continue Flonase - 1 spray each nostril 1-2  times per day depending on disease activity  3.  Continue montelukast 10 mg - 1 tablet 1 time per day  4.  If needed:   A.  Albuterol HFA -2 inhalations every 4-6 hours  B.  OTC antihistamine  5.  Return to clinic in 6 months or earlier if problem

## 2019-12-30 NOTE — Progress Notes (Signed)
[place]   Dear Dr. Graciela Husbands,  Thank you for referring Isaiah Murphy to the [facility] [place] on 12/30/2019.   Below is a summation of this patient's evaluation and recommendations.  Thank you for your referral. I will keep you informed about this patient's response to treatment.   If you have any questions please do not hesitate to contact me.   Sincerely,  Jessica Priest, MD Allergy / Immunology [facility]   ______________________________________________________________________    NEW PATIENT NOTE  Referring Provider: Garnette Czech, MD Primary Provider: Creola Corn, MD Date of office visit: 12/30/2019    Subjective:   Chief Complaint:  Isaiah Murphy (DOB: [birth year]) is a 85 y.o. male who presents to the clinic on 12/30/2019 with a chief complaint of Asthma (Says things are well No concerns) .     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  I had seen him in this clinic many years ago for these conditions.  He was living out of town for many years and has since moved back to [place] and would like to get reestablished for his care.  Overall his asthma is under very good control while using Alvesco mostly 1 time per day but occasionally twice a day.  Rarely does use a short acting bronchodilator.  He cannot exercise at this point in time because of the hip issue.  He is scheduled to have hip replacement on the left on 07 January 2020.  It does not sound as though he has required the administration of a systemic steroid for any type of asthma activity for years.  [name] is doing very well while using Flonase and montelukast on a consistent basis.  It does not sound as though he has required an antibiotic to treat an episode of sinusitis.  He has received 3 Pfizer vaccines and a flu vaccine this  year.  He has been using CPAP for the past 4 years which is working quite well.  Past Medical History:  Diagnosis Date  . Acute blood loss anemia   . Allergic rhinitis   . Arthritis   . Constipation   . Dyslipidemia   . Dysrhythmia    history of paroxysmal A Fib and typical A Flutter   . GERD (gastroesophageal reflux disease)   . History of bronchitis   . History of sick sinus syndrome   . History of syncope   . HLD (hyperlipidemia)   . Hypertension   . Hyponatremia   . Leukocytosis   . Moderate persistent asthma with acute exacerbation in adult   . Presence of permanent cardiac pacemaker   . Primary osteoarthritis of right hip   . Sleep apnea    pt states not currently using CPAP machine   . Unsteady gait     Past Surgical History:  Procedure Laterality Date  . CARDIOVERSION N/A 04/28/2013   Procedure: CARDIOVERSION;  Surgeon: Lars Masson, MD;  Location: [facility] ENDOSCOPY;  Service: Cardiovascular;  Laterality: N/A;  . HEMORRHOID SURGERY    . left shoulder surgery      secondary to torn ligament / subluxation  . TEE WITHOUT CARDIOVERSION N/A 04/28/2013   Procedure: TRANSESOPHAGEAL ECHOCARDIOGRAM (TEE);  Surgeon: Lars Masson, MD;  Location: [facility] ENDOSCOPY;  Service: Cardiovascular;  Laterality: N/A;  . TOTAL HIP ARTHROPLASTY Right 03/08/2015  Procedure: RIGHT TOTAL HIP ARTHROPLASTY ANTERIOR APPROACH;  Surgeon: Ollen Gross, MD;  Location: [facility]S;  Service: Orthopedics;  Laterality: Right;    Allergies as of 12/30/2019      Reactions   Penicillins Rash   Has patient had a PCN reaction causing immediate rash, facial/tongue/throat swelling, SOB or lightheadedness with hypotension: Yes Has patient had a PCN reaction causing severe rash involving mucus membranes or skin necrosis: Yes Has patient had a PCN reaction that required hospitalization: No Has patient had a PCN reaction occurring within the last 10 years: No If all of the above answers are "NO", then may  proceed with Cephalosporin use. Rash at the injection site      Medication List      albuterol 108 (90 Base) MCG/ACT inhaler Commonly known as: VENTOLIN HFA Inhale 2 puffs into the lungs every 6 (six) hours as needed for wheezing or shortness of breath.   apixaban 5 MG Tabs tablet Commonly known as: ELIQUIS Take 1 tablet (5 mg total) by mouth 2 (two) times daily.   atorvastatin 20 MG tablet Commonly known as: LIPITOR Take 20 mg by mouth at bedtime.   ciclesonide 160 MCG/ACT inhaler Commonly known as: ALVESCO Use one puff twice daily to prevent cough or wheeze.  Increase to 2 puffs twice daily with asthma flare. Rinse, gargle and spit after use   Edarbyclor 40-12.5 MG Tabs Generic drug: Azilsartan-Chlorthalidone Take 1 tablet by mouth daily.   esomeprazole 40 MG capsule Commonly known as: NEXIUM Take 40 mg by mouth daily as needed (reflux).   fluticasone 50 MCG/ACT nasal spray Commonly known as: FLONASE Place 2 sprays into both nostrils daily.   Mega Multi Men Tabs Take 1 tablet by mouth once a week.   montelukast 10 MG tablet Commonly known as: SINGULAIR Take 10 mg by mouth daily as needed (allergies/ itching eye).   polyethylene glycol 17 g packet Commonly known as: MIRALAX / GLYCOLAX Take 17 g by mouth daily as needed for mild constipation.   triamcinolone 0.1 % Commonly known as: KENALOG Apply 1 application topically 2 (two) times daily as needed (itching). Reported on 02/04/2015       Review of systems negative except as noted in HPI / PMHx or noted below:  Review of Systems  Constitutional: Negative.   HENT: Negative.   Eyes: Negative.   Respiratory: Negative.   Cardiovascular: Negative.   Gastrointestinal: Negative.   Genitourinary: Negative.   Musculoskeletal: Negative.   Skin: Negative.   Neurological: Negative.   Endo/Heme/Allergies: Negative.   Psychiatric/Behavioral: Negative.     Family History  Problem Relation Age of Onset  .  Hypertension Father   . Allergic rhinitis Neg Hx   . Asthma Neg Hx   . Eczema Neg Hx   . Immunodeficiency Neg Hx   . Urticaria Neg Hx     Social History   Socioeconomic History  . Marital status: Married    Spouse name: Not on file  . Number of children: Not on file  . Years of education: Not on file  . Highest education level: Not on file  Occupational History  . Not on file  Tobacco Use  . Smoking status: Never Smoker  . Smokeless tobacco: Never Used  Vaping Use  . Vaping Use: Never used  Substance and Sexual Activity  . Alcohol use: Yes    Alcohol/week: 0.0 standard drinks    Comment: socially   . Drug use: No  . Sexual activity: Not on  file  Other Topics Concern  . 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  Objective:   Vitals:   12/30/19 1455  BP: 140/80  Pulse: 75  Resp: 14  Temp: (!) 97 F (36.1 C)  SpO2: 97%   Height: 5\' 9"  (175.3 cm) Weight: 191 lb 12.8 oz (87 kg)  Physical Exam Constitutional:      Appearance: He is not diaphoretic.  HENT:     Head: Normocephalic.     Right Ear: Tympanic membrane, ear canal and external ear normal.     Left Ear: Tympanic membrane, ear canal and external ear normal.     Murphy: Murphy normal. No mucosal edema or rhinorrhea.     Mouth/Throat:     Pharynx: Uvula midline. No oropharyngeal exudate.  Eyes:     Conjunctiva/sclera: Conjunctivae normal.  Neck:     Thyroid: No thyromegaly.     Trachea: Trachea normal. No tracheal tenderness or tracheal deviation.  Cardiovascular:     Rate and Rhythm: Normal rate and regular rhythm.     Heart sounds: Normal heart sounds, S1 normal and S2 normal. No murmur heard.   Pulmonary:     Effort:  No respiratory distress.     Breath sounds: Normal breath sounds. No stridor. No wheezing or rales.  Lymphadenopathy:     Head:     Right side of head: No tonsillar adenopathy.     Left side of head: No tonsillar adenopathy.     Cervical: No cervical adenopathy.  Skin:    Findings: No erythema or rash.     Nails: There is no clubbing.  Neurological:     Mental Status: He is alert.     Diagnostics: Allergy skin tests were not performed.   Spirometry was performed and demonstrated an FEV1 of 1.92 @ 74 % of predicted. FEV1/FVC = 0.67  Assessment and Plan:    1. Asthma, well controlled, mild persistent   2. Perennial allergic rhinitis     1.  Continue Alvesco 160 - 2 inhalations 1-2 times per day depending on disease activity  2.  Continue Flonase - 1 spray each nostril 1-2  times per day depending on disease activity  3.  Continue montelukast 10 mg - 1 tablet 1 time per day  4.  If needed:   A.  Albuterol HFA -2 inhalations every 4-6 hours  B.  OTC antihistamine  5.  Return to clinic in 6 months or earlier if problem  Overall Dr. appears to be doing relatively well regarding his airway condition while utilizing an inhaled steroid and a nasal steroid and a leukotriene modifier and I see no need for changing his therapy at this point in time.  He has a very good understanding of his disease state and appropriate dosing of his medications and understands when to alter the dose of his medications depending on disease activity.  If he continues to do well I will see him back in this clinic in 6 months or earlier if there is a problem.  Willa Rough, MD Allergy / Immunology [facility] [place]

## 2019-12-31 ENCOUNTER — Encounter: Payer: Self-pay | Admitting: Allergy and Immunology

## 2019-12-31 NOTE — Progress Notes (Signed)
Anesthesia Chart Review   Case: [id number] Date/Time: 01/07/20 0815   Procedure: TOTAL HIP ARTHROPLASTY ANTERIOR APPROACH (Left Hip) -   Anesthesia type: Choice   Pre-op diagnosis: left hip osteoarthritis   [facility] RO[facility] 10 / [facility]   Surgeons: Ollen Gross, MD      DISCUSSION:85 y.o. never smoker with h/o HTN, GERD, sleep apnea, CHB s/p pacemaker (last device check 10/20/19, device orders in 12/29/19 note) PAF (on Eliquis), left hip OA scheduled for above procedure 01/07/20 with Dr. Ollen Gross.   Per cardiology preoperative risk assessment 12/01/2019, "Chart reviewed as part of pre-operative protocol coverage. Patient last seen 08/15/19 by Dr Katrinka Blazing and was stable from a cardiac perspective. Since the visit he denies any new cardiac symptoms. No chest pain, sob, LLE, palpitations, or orthopnea. Activity is limited by his hip pain. Given past medical history and time since last visit, based on ACC/AHA guidelines, Isaiah Murphy would be at acceptable risk for the planned procedure without further cardiovascular testing.  Per pharmacy recommendations it is okay to hold Eliquis 3 days prior to the procedure. Patient will not need bridging with Lovenox. If not bridging, patient should restartEliquison the evening of procedure or day after, at discretion of procedure MD. For orthopedic procedures please be sure to resume therapeutic (not prophylactic) dosing."  Anticipate pt can proceed with planned procedure barring acute status change.   VS: BP (!) 148/85   Pulse 82   Temp 37.1 C (Oral)   Resp 16   Ht 5\' 9"  (1.753 m)   Wt 86.2 kg   SpO2 99%   BMI 28.06 kg/m   PROVIDERS: , MD is PCP   Creola Corn, MD is Cardiologist   Verdis Prime, MD is EP LABS: Labs reviewed: Acceptable for surgery. (all labs ordered are listed, but only abnormal results are displayed)  Labs Reviewed  COMPREHENSIVE METABOLIC PANEL - Abnormal; Notable for the following components:       Result Value   Sodium 134 (*)    Calcium 8.7 (*)    All other components within normal limits  SURGICAL PCR SCREEN  APTT  CBC  PROTIME-INR  TYPE AND SCREEN     IMAGES:   EKG: 08/15/2019 Rate 83 bpm    CV: Echo 07/28/2016 1. Normal LV size and function 2. Visually estimated ejection frction is 60% 3. The left atrium is mildly dilated by 2D & Volumes 4. Mild mitral valve regurgitation 5. Aortic valve is trileaflet with asymmetric thickening and calcification of the [place] cusp, which is nearly immobile. The other two cusps demonstrate normal leaflet excursion 6. There is mild aortic valve stenosis with peak gradient of 07/30/2016, a mean gradient of , and a valve area of 1.8 cm2 7. The aortic root is normal in size. The ascending aorta is mildly dilated 8. Mild tricuspid valve regurgitation  9. Linear densities consistent with pacemaker leads are visible within the right hear chambers 10. Compared to the previous study of March 2017, there has been no significant change.   Past Medical History:  Diagnosis Date  . Acute blood loss anemia   . Allergic rhinitis   . Arthritis   . Constipation   . Dyslipidemia   . Dysrhythmia    history of paroxysmal A Fib and typical A Flutter   . GERD (gastroesophageal reflux disease)   . History of bronchitis   . History of sick sinus syndrome   . History of syncope   . HLD (hyperlipidemia)   .  Hypertension   . Hyponatremia   . Leukocytosis   . Moderate persistent asthma with acute exacerbation in adult   . Presence of permanent cardiac pacemaker   . Primary osteoarthritis of right hip   . Sleep apnea    pt states not currently using CPAP machine   . Unsteady gait     Past Surgical History:  Procedure Laterality Date  . CARDIOVERSION N/A 04/28/2013   Procedure: CARDIOVERSION;  Surgeon: Lars Masson, MD;  Location: [facility] ENDOSCOPY;  Service: Cardiovascular;  Laterality: N/A;  . HEMORRHOID SURGERY    . left shoulder surgery       secondary to torn ligament / subluxation  . TEE WITHOUT CARDIOVERSION N/A 04/28/2013   Procedure: TRANSESOPHAGEAL ECHOCARDIOGRAM (TEE);  Surgeon: Lars Masson, MD;  Location: [facility] ENDOSCOPY;  Service: Cardiovascular;  Laterality: N/A;  . TOTAL HIP ARTHROPLASTY Right 03/08/2015   Procedure: RIGHT TOTAL HIP ARTHROPLASTY ANTERIOR APPROACH;  Surgeon: Ollen Gross, MD;  Location[facility];  Service: Orthopedics;  Laterality: Right;    MEDICATIONS: . albuterol (VENTOLIN HFA) 108 (90 Base) MCG/ACT inhaler  . apixaban (ELIQUIS) 5 MG TABS tablet  . atorvastatin (LIPITOR) 20 MG tablet  . Azilsartan-Chlorthalidone (EDARBYCLOR) 40-12.5 MG TABS  . ciclesonide (ALVESCO) 160 MCG/ACT inhaler  . esomeprazole (NEXIUM) 40 MG capsule  . fluticasone (FLONASE) 50 MCG/ACT nasal spray  . montelukast (SINGULAIR) 10 MG tablet  . Multiple Vitamins-Minerals (MEGA MULTI MEN) TABS  . olopatadine (PATANOL) 0.1 % ophthalmic solution  . polyethylene glycol (MIRALAX / GLYCOLAX) packet  . triamcinolone cream (KENALOG) 0.1 %   No current facility-administered medications for this encounter.     Jodell Cipro, PA-C [facility] [phone]

## 2019-12-31 NOTE — Anesthesia Preprocedure Evaluation (Addendum)
Anesthesia Evaluation  Patient identified by MRN, date of birth, ID band Patient awake    Reviewed: Allergy & Precautions, H&P , NPO status , Patient's Chart, lab work & pertinent test results, reviewed documented beta blocker date and time   Airway Mallampati: II  TM Distance: >3 FB Neck ROM: full    Dental no notable dental hx.    Pulmonary neg pulmonary ROS,    Pulmonary exam normal breath sounds clear to auscultation       Cardiovascular Exercise Tolerance: Good hypertension, negative cardio ROS   Rhythm:regular Rate:Normal     Neuro/Psych negative neurological ROS  negative psych ROS   GI/Hepatic negative GI ROS, Neg liver ROS,   Endo/Other  negative endocrine ROS  Renal/GU negative Renal ROS  negative genitourinary   Musculoskeletal   Abdominal   Peds  Hematology negative hematology ROS (+)   Anesthesia Other Findings   Reproductive/Obstetrics negative OB ROS                            Anesthesia Physical Anesthesia Plan  ASA: III  Anesthesia Plan: MAC and Spinal   Post-op Pain Management:    Induction: Intravenous  PONV Risk Score and Plan: 2  Airway Management Planned: Nasal Cannula, Natural Airway, Simple Face Mask and Mask  Additional Equipment: None  Intra-op Plan:   Post-operative Plan:   Informed Consent: I have reviewed the patients History and Physical, chart, labs and discussed the procedure including the risks, benefits and alternatives for the proposed anesthesia with the patient or authorized representative who has indicated his/her understanding and acceptance.     Dental Advisory Given  Plan Discussed with: CRNA and Anesthesiologist  Anesthesia Plan Comments: (See PAT note 12/30/2019, Konrad Felix, PA-C 85 y.o. never smoker with h/o HTN, GERD, sleep apnea, CHB s/p pacemaker (last device check 10/20/19, device orders in 12/29/19 note) PAF (on  Eliquis), left hip OA scheduled for above procedure 01/07/20 with Dr. Gaynelle Arabian.   Per cardiology preoperative risk assessment 12/01/2019, "Chart reviewed as part of pre-operative protocol coverage.Patient last seen 08/15/19 by Dr Tamala Julian and was stable from a cardiac perspective. Since the visit he denies any new cardiac symptoms. No chest pain, sob, LLE, palpitations, or orthopnea. Activity is limited by his hip pain.Given past medical history and time since last visit, based on ACC/AHA guidelines, Marcial Pless Hickswould be at acceptable risk for the planned procedure without further cardiovascular testing.  Per pharmacy recommendations it is okay to hold Eliquis 3 days prior to the procedure. Patient will not need bridging with Lovenox.If not bridging, patient should restartEliquison the evening of procedure or day after, at discretion of procedure MD.For orthopedic procedures please be sure to resume therapeutic (not prophylactic) dosing."  EKG: 08/15/2019 Rate 83 bpm   Echo 07/28/2016 1. Normal LV size and function 2. Visually estimated ejection frction is 60% 3. The left atrium is mildly dilated by 2D & Volumes 4. Mild mitral valve regurgitation 5. Aortic valve is trileaflet with asymmetric thickening and calcification of the [place] cusp, which is nearly immobile. The other two cusps demonstrate normal leaflet excursion 6. There is mild aortic valve stenosis with peak gradient of 65mHg, a mean gradient of 668mg, and a valve area of 1.8 cm2 7. The aortic root is normal in size. The ascending aorta is mildly dilated 8. Mild tricuspid valve regurgitation  9. Linear densities consistent with pacemaker leads are visible within the right hear chambers )  Anesthesia Quick Evaluation  

## 2020-01-03 ENCOUNTER — Other Ambulatory Visit (HOSPITAL_COMMUNITY)
Admission: RE | Admit: 2020-01-03 | Discharge: 2020-01-03 | Disposition: A | Discharge status: Home / Self Care | Payer: Medicare Other | Source: Ambulatory Visit | Attending: Orthopedic Surgery | Admitting: Orthopedic Surgery

## 2020-01-03 DIAGNOSIS — Z01812 Encounter for preprocedural laboratory examination: Secondary | ICD-10-CM | POA: Diagnosis present

## 2020-01-03 DIAGNOSIS — Z20822 Contact with and (suspected) exposure to covid-19: Secondary | ICD-10-CM | POA: Diagnosis not present

## 2020-01-04 LAB — SARS CORONAVIRUS 2 (TAT 6-24 HRS): SARS Coronavirus 2: NEGATIVE

## 2020-01-05 ENCOUNTER — Telehealth: Payer: Self-pay

## 2020-01-05 MED ORDER — TRIAMCINOLONE ACETONIDE 0.1 % EX CREA: 1.0000 "application " | TOPICAL_CREAM | Freq: Two times a day (BID) | CUTANEOUS | 3 refills | Status: DC | PRN

## 2020-01-05 NOTE — Telephone Encounter (Signed)
Patient would like a refill on the triamcinolone cream. I did look at the last note but it did not mention to use the triamcinolone cream. Please advise on whether this is okay or not and thank you.

## 2020-01-05 NOTE — Addendum Note (Signed)
Addended by: Mliss Fritz I on: 01/05/2020 11:52 AM   Modules accepted: Orders

## 2020-01-05 NOTE — Telephone Encounter (Signed)
triamcinolone 0.1 % Commonly known as: KENALOG Apply 1 application topically 2 (two) times daily as needed (itching). Reported on 02/04/2015  Apparently he has had this topical agent prescribed in the past. Can refill x 3 .

## 2020-01-05 NOTE — Telephone Encounter (Signed)
Refill has been sent in.  

## 2020-01-06 ENCOUNTER — Other Ambulatory Visit: Payer: Self-pay | Admitting: *Deleted

## 2020-01-06 NOTE — H&P (Signed)
TOTAL HIP ADMISSION H&P  Patient is admitted for left total hip arthroplasty.  Subjective:  Chief Complaint: Left hip pain  HPI: Isaiah Murphy, 85 y.o. male, has a history of pain and functional disability in the left hip due to arthritis and patient has failed non-surgical conservative treatments for greater than 12 weeks to include activity modification. Onset of symptoms was gradual, starting several years ago with gradually worsening course since that time. The patient noted no past surgery on the left hip. Patient currently rates pain in the left hip at 5 out of 10 with activity. Patient has worsening of pain with activity and weight bearing. Imaging studies revealed that the patient has evidence of bone-on-bone arthritis with large marginal osteophytes and subchondral cysts. This condition presents safety issues increasing the risk of falls. There is no current active infection.  Patient Active Problem List   Diagnosis Date Noted  . Tachycardia-bradycardia syndrome (HCC) 01/01/2019  . Pacemaker 01/01/2019  . OA (osteoarthritis) of hip 03/08/2015  . Asthma 02/04/2015  . Atrial flutter (HCC) 04/25/2013  . Near syncope 04/25/2013  . HTN (hypertension) 04/25/2013  . Dyslipidemia 04/25/2013    Past Medical History:  Diagnosis Date  . Acute blood loss anemia   . Allergic rhinitis   . Arthritis   . Constipation   . Dyslipidemia   . Dysrhythmia    history of paroxysmal A Fib and typical A Flutter   . GERD (gastroesophageal reflux disease)   . History of bronchitis   . History of sick sinus syndrome   . History of syncope   . HLD (hyperlipidemia)   . Hypertension   . Hyponatremia   . Leukocytosis   . Moderate persistent asthma with acute exacerbation in adult   . Presence of permanent cardiac pacemaker   . Primary osteoarthritis of right hip   . Sleep apnea    pt states not currently using CPAP machine   . Unsteady gait     Past Surgical History:  Procedure Laterality  Date  . CARDIOVERSION N/A 04/28/2013   Procedure: CARDIOVERSION;  Surgeon: Lars Masson, MD;  Location: [facility] ENDOSCOPY;  Service: Cardiovascular;  Laterality: N/A;  . HEMORRHOID SURGERY    . left shoulder surgery      secondary to torn ligament / subluxation  . TEE WITHOUT CARDIOVERSION N/A 04/28/2013   Procedure: TRANSESOPHAGEAL ECHOCARDIOGRAM (TEE);  Surgeon: Lars Masson, MD;  Location: [facility] ENDOSCOPY;  Service: Cardiovascular;  Laterality: N/A;  . TOTAL HIP ARTHROPLASTY Right 03/08/2015   Procedure: RIGHT TOTAL HIP ARTHROPLASTY ANTERIOR APPROACH;  Surgeon: Ollen Gross, MD;  Location: [facility] ORS;  Service: Orthopedics;  Laterality: Right;    Prior to Admission medications   Medication Sig Start Date End Date Taking? Authorizing Provider  albuterol (VENTOLIN HFA) 108 (90 Base) MCG/ACT inhaler Inhale 2 puffs into the lungs every 6 (six) hours as needed for wheezing or shortness of breath.   Yes [provider]  apixaban (ELIQUIS) 5 MG TABS tablet Take 1 tablet (5 mg total) by mouth 2 (two) times daily. 08/15/19  Yes Lyn Records, MD  atorvastatin (LIPITOR) 20 MG tablet Take 20 mg by mouth at bedtime.    Yes [provider]  Azilsartan-Chlorthalidone (EDARBYCLOR) 40-12.5 MG TABS Take 1 tablet by mouth daily. 08/15/19  Yes Lyn Records, MD  ciclesonide (ALVESCO) 160 MCG/ACT inhaler Use one puff twice daily to prevent cough or wheeze.  Increase to 2 puffs twice daily with asthma flare. Rinse, gargle and spit  after use Patient taking differently: Inhale 1 puff into the lungs daily. with asthma flare. Rinse, gargle and spit after use 08/15/19  Yes Lyn RecordsSmith, Henry W, MD  esomeprazole (NEXIUM) 40 MG capsule Take 40 mg by mouth daily as needed (reflux).   Yes [provider]  fluticasone (FLONASE) 50 MCG/ACT nasal spray Place 2 sprays into both nostrils daily.   Yes [provider]  montelukast (SINGULAIR) 10 MG tablet Take 10 mg by mouth daily as needed (allergies/  itching eye).    Yes [provider]  Multiple Vitamins-Minerals (MEGA MULTI MEN) TABS Take 1 tablet by mouth once a week.   Yes [provider]  olopatadine (PATANOL) 0.1 % ophthalmic solution Place 1 drop into both eyes daily.    Yes [provider]  polyethylene glycol (MIRALAX / GLYCOLAX) packet Take 17 g by mouth daily as needed for mild constipation. 03/11/15   Perkins, Alexzandrew L, PA-C  triamcinolone (KENALOG) 0.1 % Apply 1 application topically 2 (two) times daily as needed (itching). 01/05/20   Kozlow, Alvira PhilipsEric J, MD    Allergies  Allergen Reactions  . Penicillins Rash    Has patient had a PCN reaction causing immediate rash, facial/tongue/throat swelling, SOB or lightheadedness with hypotension: Yes Has patient had a PCN reaction causing severe rash involving mucus membranes or skin necrosis: Yes Has patient had a PCN reaction that required hospitalization: No Has patient had a PCN reaction occurring within the last 10 years: No If all of the above answers are "NO", then may proceed with Cephalosporin use. Rash at the injection site    Social History   Socioeconomic History  . Marital status: Married    Spouse name: Not on file  . Number of children: Not on file  . Years of education: Not on file  . Highest education level: Not on file  Occupational History  . Not on file  Tobacco Use  . Smoking status: Never Smoker  . Smokeless tobacco: Never Used  Vaping Use  . Vaping Use: Never used  Substance and Sexual Activity  . Alcohol use: Yes    Alcohol/week: 0.0 standard drinks    Comment: socially   . Drug use: No  . Sexual activity: Not on file  Other Topics Concern  . 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 Difficulty of Paying Living Expenses: Not on file  Food Insecurity:   . Worried About Programme researcher, broadcasting/film/videounning Out of Food in the  Last Year: Not on file  . Ran Out of Food in the Last Year: Not on file  Transportation Needs:   . Lack of Transportation (Medical): Not on file  . Lack of Transportation (Non-Medical): Not on file  Physical Activity:   . Days of Exercise per Week: Not on file  . Minutes of Exercise per Session: Not on file  Stress:   . Feeling of Stress : Not on file  Social Connections:   . Frequency of Communication with Friends and Family: Not on file  . Frequency of Social Gatherings with Friends and Family: Not on file  . Attends Religious Services: Not on file  . Active Member of Clubs or Organizations: Not on file  . Attends BankerClub or Organization Meetings: Not on file  . Marital Status: Not on file  Intimate Partner Violence:   . Fear of Current or Ex-Partner: Not  on file  . Emotionally Abused: Not on file  . Physically Abused: Not on file  . Sexually Abused: Not on file      Tobacco Use: Low Risk   . Smoking Tobacco Use: Never Smoker  . Smokeless Tobacco Use: Never Used   Social History   Substance and Sexual Activity  Alcohol Use Yes  . Alcohol/week: 0.0 standard drinks   Comment: socially     Family History  Problem Relation Age of Onset  . Hypertension Father   . Allergic rhinitis Neg Hx   . Asthma Neg Hx   . Eczema Neg Hx   . Immunodeficiency Neg Hx   . Urticaria Neg Hx     ROS: Constitutional: no fever, no chills, no night sweats, no significant weight loss Cardiovascular: no chest pain, no palpitations Respiratory: no cough, no shortness of breath, No COPD Gastrointestinal: no vomiting, no nausea Musculoskeletal: no swelling in Joints, Joint Pain Neurologic: no numbness, no tingling, no difficulty with balance   . Objective:  Physical Exam: Well nourished and well developed.  General: Alert and oriented x3, cooperative and pleasant, no acute distress.  Head: normocephalic, atraumatic, neck supple.  Eyes: EOMI.  Respiratory: breath sounds clear in all  fields, no wheezing, rales, or rhonchi. Cardiovascular: Regular rate and rhythm, no murmurs, gallops or rubs.  Abdomen: non-tender to palpation and soft, normoactive bowel sounds. Musculoskeletal:  Left Hip Exam:  ROM: Flexion to 100 degrees, Internal Rotation to 0 degrees, External Rotation to 10 to 20 degrees, and abduction to 20 degrees with pain on flexion and rotation maneuvers.  There is no tenderness over the greater trochanter bursa.   Calves soft and nontender. Motor function intact in LE. Strength 5/5 LE bilaterally. Neuro: Distal pulses 2+. Sensation to light touch intact in LE.  Imaging Review: AP pelvis, AP and lateral of the left hip dated 10/17/2019 shows bone-on-bone arthritis, which has progressed over a 2 to 3-year time. He has large marginal osteophytes and subchondral cysts.   Assessment/Plan:  End stage arthritis, left hip  The patient history, physical examination, clinical judgement of the provider and imaging studies are consistent with end stage degenerative joint disease of the left hip and total hip arthroplasty is deemed medically necessary. The treatment options including medical management, injection therapy, arthroscopy and arthroplasty were discussed at length. The risks and benefits of total hip arthroplasty were presented and reviewed. The risks due to aseptic loosening, infection, stiffness, dislocation/subluxation, thromboembolic complications and other imponderables were discussed. The patient acknowledged the explanation, agreed to proceed with the plan and consent was signed. Patient is being admitted for inpatient treatment for surgery, pain control, PT, OT, prophylactic antibiotics, VTE prophylaxis, progressive ambulation and ADLs and discharge planning.The patient is planning to be discharged home with home health services.  Risks and benefits of the surgery were discussed with the patient and Dr. Lequita Halt at their previous office visit, and the patient  has elected to move forward with the aforementioned surgery. Post-operative care plans were discussed with the patient today and all patient questions were answered.   Therapy Plans: HHPT Disposition: Home with spouse Planned DVT Prophylaxis: Eliquis 5 mg BID DME Needed: None PCP: Elio Forget, MD [place], [place]) Cardiologist: Verdis Prime, MD (clearance received) TXA: IV Allergies: PCN (rash) Anesthesia Concerns: None BMI: 27.9 Last HgbA1c: Not diabetic Pharmacy: [facility] ([address])  Other: - Holding Eliquis 3 days prior to surgery.  - Cardiac pacemaker in place   Patient's anticipated LOS is less than  2 midnights, meeting these requirements: - Lives within 1 hour of care - Has a competent adult at home to recover with post-op recover - NO history of  - Chronic pain requiring opioids  - Diabetes  - Coronary Artery Disease  - Heart failure  - Heart attack  - Stroke  - DVT/VTE  - Cardiac arrhythmia  - Respiratory Failure/COPD  - Renal failure  - Advanced Liver disease  - Patient was instructed on what medications to stop prior to surgery. - Follow-up visit in 2 weeks with Dr. Lequita Halt. - Begin physical therapy following surgery. - Pre-operative lab work as pre-surgical testing. - Prescriptions will be provided in hospital at time of discharge.  Nelia Shi, MBA, PA-C Orthopedic Surgery [facility] [place]

## 2020-01-07 ENCOUNTER — Encounter (HOSPITAL_COMMUNITY): Payer: Self-pay | Admitting: Orthopedic Surgery

## 2020-01-07 ENCOUNTER — Observation Stay (HOSPITAL_COMMUNITY): Payer: Medicare Other

## 2020-01-07 ENCOUNTER — Ambulatory Visit (HOSPITAL_COMMUNITY): Payer: Medicare Other

## 2020-01-07 ENCOUNTER — Ambulatory Visit (HOSPITAL_COMMUNITY): Payer: Medicare Other | Admitting: Anesthesiology

## 2020-01-07 ENCOUNTER — Inpatient Hospital Stay (HOSPITAL_COMMUNITY)
Admission: RE | Admit: 2020-01-07 | Discharge: 2020-01-10 | DRG: 470 | Disposition: A | Discharge status: Home Health | Payer: Medicare Other | Attending: Orthopedic Surgery | Admitting: Orthopedic Surgery

## 2020-01-07 ENCOUNTER — Other Ambulatory Visit: Payer: Self-pay

## 2020-01-07 ENCOUNTER — Ambulatory Visit (HOSPITAL_COMMUNITY): Payer: Medicare Other | Admitting: Physician Assistant

## 2020-01-07 ENCOUNTER — Encounter (HOSPITAL_COMMUNITY)
Admission: RE | Disposition: A | Discharge status: Home Health | Payer: Self-pay | Source: Home / Self Care | Attending: Orthopedic Surgery

## 2020-01-07 DIAGNOSIS — I483 Typical atrial flutter: Secondary | ICD-10-CM | POA: Diagnosis present

## 2020-01-07 DIAGNOSIS — Z7902 Long term (current) use of antithrombotics/antiplatelets: Secondary | ICD-10-CM

## 2020-01-07 DIAGNOSIS — Z880 Allergy status to penicillin: Secondary | ICD-10-CM

## 2020-01-07 DIAGNOSIS — Z79899 Other long term (current) drug therapy: Secondary | ICD-10-CM

## 2020-01-07 DIAGNOSIS — Z8249 Family history of ischemic heart disease and other diseases of the circulatory system: Secondary | ICD-10-CM

## 2020-01-07 DIAGNOSIS — Z419 Encounter for procedure for purposes other than remedying health state, unspecified: Secondary | ICD-10-CM

## 2020-01-07 DIAGNOSIS — J4540 Moderate persistent asthma, uncomplicated: Secondary | ICD-10-CM | POA: Diagnosis present

## 2020-01-07 DIAGNOSIS — I10 Essential (primary) hypertension: Secondary | ICD-10-CM | POA: Diagnosis present

## 2020-01-07 DIAGNOSIS — Z950 Presence of cardiac pacemaker: Secondary | ICD-10-CM

## 2020-01-07 DIAGNOSIS — M1612 Unilateral primary osteoarthritis, left hip: Principal | ICD-10-CM | POA: Diagnosis present

## 2020-01-07 DIAGNOSIS — E785 Hyperlipidemia, unspecified: Secondary | ICD-10-CM | POA: Diagnosis present

## 2020-01-07 DIAGNOSIS — Z96641 Presence of right artificial hip joint: Secondary | ICD-10-CM | POA: Diagnosis present

## 2020-01-07 DIAGNOSIS — I480 Paroxysmal atrial fibrillation: Secondary | ICD-10-CM | POA: Diagnosis present

## 2020-01-07 DIAGNOSIS — M25752 Osteophyte, left hip: Secondary | ICD-10-CM | POA: Diagnosis present

## 2020-01-07 DIAGNOSIS — I495 Sick sinus syndrome: Secondary | ICD-10-CM | POA: Diagnosis present

## 2020-01-07 DIAGNOSIS — Z96642 Presence of left artificial hip joint: Secondary | ICD-10-CM

## 2020-01-07 DIAGNOSIS — Z96649 Presence of unspecified artificial hip joint: Secondary | ICD-10-CM

## 2020-01-07 DIAGNOSIS — K219 Gastro-esophageal reflux disease without esophagitis: Secondary | ICD-10-CM | POA: Diagnosis present

## 2020-01-07 HISTORY — PX: TOTAL HIP ARTHROPLASTY: SHX124

## 2020-01-07 LAB — TYPE AND SCREEN
ABO/RH(D): O POS
Antibody Screen: NEGATIVE

## 2020-01-07 SURGERY — ARTHROPLASTY, HIP, TOTAL, ANTERIOR APPROACH
Anesthesia: Monitor Anesthesia Care | Site: Hip | Laterality: Left

## 2020-01-07 MED ORDER — LACTATED RINGERS IV SOLN: INTRAVENOUS | Status: DC

## 2020-01-07 MED ORDER — VANCOMYCIN HCL IN DEXTROSE 1-5 GM/200ML-% IV SOLN
1000.0000 mg | Freq: Two times a day (BID) | INTRAVENOUS | Status: AC
  Administered 2020-01-07: 1000 mg via INTRAVENOUS
  Filled 2020-01-07: qty 200

## 2020-01-07 MED ORDER — ACETAMINOPHEN 325 MG PO TABS: 325.0000 mg | ORAL_TABLET | ORAL | Status: DC | PRN

## 2020-01-07 MED ORDER — ORAL CARE MOUTH RINSE: 15.0000 mL | Freq: Once | OROMUCOSAL | Status: AC

## 2020-01-07 MED ORDER — BISACODYL 10 MG RE SUPP: 10.0000 mg | Freq: Every day | RECTAL | Status: DC | PRN

## 2020-01-07 MED ORDER — PANTOPRAZOLE SODIUM 40 MG PO TBEC: 40.0000 mg | DELAYED_RELEASE_TABLET | Freq: Every day | ORAL | Status: DC | PRN

## 2020-01-07 MED ORDER — VANCOMYCIN HCL IN DEXTROSE 1-5 GM/200ML-% IV SOLN
1000.0000 mg | INTRAVENOUS | Status: AC
  Administered 2020-01-07: 1000 mg via INTRAVENOUS
  Filled 2020-01-07: qty 200

## 2020-01-07 MED ORDER — POLYETHYLENE GLYCOL 3350 17 G PO PACK
17.0000 g | PACK | Freq: Every day | ORAL | Status: DC | PRN
  Administered 2020-01-09: 17 g via ORAL
  Filled 2020-01-07: qty 1

## 2020-01-07 MED ORDER — FLUTICASONE PROPIONATE 50 MCG/ACT NA SUSP
2.0000 | Freq: Every day | NASAL | Status: DC
  Filled 2020-01-07: qty 16

## 2020-01-07 MED ORDER — PROPOFOL 500 MG/50ML IV EMUL
INTRAVENOUS | Status: DC | PRN
  Administered 2020-01-07: 75 ug/kg/min via INTRAVENOUS

## 2020-01-07 MED ORDER — MORPHINE SULFATE (PF) 2 MG/ML IV SOLN: 0.5000 mg | INTRAVENOUS | Status: DC | PRN

## 2020-01-07 MED ORDER — PHENYLEPHRINE HCL-NACL 10-0.9 MG/250ML-% IV SOLN
INTRAVENOUS | Status: AC
  Filled 2020-01-07: qty 250

## 2020-01-07 MED ORDER — ATORVASTATIN CALCIUM 20 MG PO TABS
20.0000 mg | ORAL_TABLET | Freq: Every day | ORAL | Status: DC
  Administered 2020-01-08 – 2020-01-09 (×2): 20 mg via ORAL
  Filled 2020-01-07 (×2): qty 1

## 2020-01-07 MED ORDER — APIXABAN 2.5 MG PO TABS
2.5000 mg | ORAL_TABLET | Freq: Two times a day (BID) | ORAL | Status: DC
  Administered 2020-01-08 (×2): 2.5 mg via ORAL
  Filled 2020-01-07 (×2): qty 1

## 2020-01-07 MED ORDER — PHENYLEPHRINE HCL-NACL 10-0.9 MG/250ML-% IV SOLN
INTRAVENOUS | Status: DC | PRN
  Administered 2020-01-07: 50 ug/min via INTRAVENOUS

## 2020-01-07 MED ORDER — HYDROCODONE-ACETAMINOPHEN 5-325 MG PO TABS
1.0000 | ORAL_TABLET | ORAL | Status: DC | PRN
  Administered 2020-01-07: 1 via ORAL
  Filled 2020-01-07: qty 1

## 2020-01-07 MED ORDER — SODIUM CHLORIDE 0.9 % IV SOLN: INTRAVENOUS | Status: DC

## 2020-01-07 MED ORDER — FENTANYL CITRATE (PF) 100 MCG/2ML IJ SOLN
INTRAMUSCULAR | Status: DC | PRN
  Administered 2020-01-07 (×2): 50 ug via INTRAVENOUS

## 2020-01-07 MED ORDER — ONDANSETRON HCL 4 MG/2ML IJ SOLN
INTRAMUSCULAR | Status: DC | PRN
  Administered 2020-01-07: 4 mg via INTRAVENOUS

## 2020-01-07 MED ORDER — ACETAMINOPHEN 160 MG/5ML PO SOLN: 325.0000 mg | ORAL | Status: DC | PRN

## 2020-01-07 MED ORDER — OLOPATADINE HCL 0.1 % OP SOLN
1.0000 [drp] | Freq: Every day | OPHTHALMIC | Status: DC
  Filled 2020-01-07: qty 5

## 2020-01-07 MED ORDER — METOCLOPRAMIDE HCL 5 MG PO TABS: 5.0000 mg | ORAL_TABLET | Freq: Three times a day (TID) | ORAL | Status: DC | PRN

## 2020-01-07 MED ORDER — DEXAMETHASONE SODIUM PHOSPHATE 10 MG/ML IJ SOLN
8.0000 mg | Freq: Once | INTRAMUSCULAR | Status: AC
  Administered 2020-01-07: 8 mg via INTRAVENOUS

## 2020-01-07 MED ORDER — MONTELUKAST SODIUM 10 MG PO TABS: 10.0000 mg | ORAL_TABLET | Freq: Every day | ORAL | Status: DC | PRN

## 2020-01-07 MED ORDER — MEPERIDINE HCL 50 MG/ML IJ SOLN: 6.2500 mg | INTRAMUSCULAR | Status: DC | PRN

## 2020-01-07 MED ORDER — POVIDONE-IODINE 10 % EX SWAB
2.0000 "application " | Freq: Once | CUTANEOUS | Status: AC
  Administered 2020-01-07: 2 via TOPICAL

## 2020-01-07 MED ORDER — OXYCODONE HCL 5 MG PO TABS: 5.0000 mg | ORAL_TABLET | Freq: Once | ORAL | Status: DC | PRN

## 2020-01-07 MED ORDER — DEXAMETHASONE SODIUM PHOSPHATE 10 MG/ML IJ SOLN
10.0000 mg | Freq: Once | INTRAMUSCULAR | Status: AC
  Administered 2020-01-08: 10 mg via INTRAVENOUS
  Filled 2020-01-07: qty 1

## 2020-01-07 MED ORDER — BUPIVACAINE HCL 0.25 % IJ SOLN
INTRAMUSCULAR | Status: AC
  Filled 2020-01-07: qty 1

## 2020-01-07 MED ORDER — ACETAMINOPHEN 10 MG/ML IV SOLN
1000.0000 mg | Freq: Once | INTRAVENOUS | Status: AC
  Administered 2020-01-07: 1000 mg via INTRAVENOUS
  Filled 2020-01-07: qty 100

## 2020-01-07 MED ORDER — ONDANSETRON HCL 4 MG PO TABS: 4.0000 mg | ORAL_TABLET | Freq: Four times a day (QID) | ORAL | Status: DC | PRN

## 2020-01-07 MED ORDER — CHLORHEXIDINE GLUCONATE 0.12 % MT SOLN
15.0000 mL | Freq: Once | OROMUCOSAL | Status: AC
  Administered 2020-01-07: 15 mL via OROMUCOSAL

## 2020-01-07 MED ORDER — FENTANYL CITRATE (PF) 100 MCG/2ML IJ SOLN: 25.0000 ug | INTRAMUSCULAR | Status: DC | PRN

## 2020-01-07 MED ORDER — ONDANSETRON HCL 4 MG/2ML IJ SOLN
INTRAMUSCULAR | Status: AC
  Filled 2020-01-07: qty 2

## 2020-01-07 MED ORDER — BUDESONIDE 0.5 MG/2ML IN SUSP
0.5000 mg | Freq: Two times a day (BID) | RESPIRATORY_TRACT | Status: DC
  Administered 2020-01-08 – 2020-01-10 (×5): 0.5 mg via RESPIRATORY_TRACT
  Filled 2020-01-07 (×5): qty 2

## 2020-01-07 MED ORDER — DEXAMETHASONE SODIUM PHOSPHATE 10 MG/ML IJ SOLN
INTRAMUSCULAR | Status: AC
  Filled 2020-01-07: qty 1

## 2020-01-07 MED ORDER — PHENYLEPHRINE 40 MCG/ML (10ML) SYRINGE FOR IV PUSH (FOR BLOOD PRESSURE SUPPORT)
PREFILLED_SYRINGE | INTRAVENOUS | Status: DC | PRN
  Administered 2020-01-07 (×2): 80 ug via INTRAVENOUS

## 2020-01-07 MED ORDER — METOCLOPRAMIDE HCL 5 MG/ML IJ SOLN: 5.0000 mg | Freq: Three times a day (TID) | INTRAMUSCULAR | Status: DC | PRN

## 2020-01-07 MED ORDER — IRBESARTAN 150 MG PO TABS
300.0000 mg | ORAL_TABLET | Freq: Every day | ORAL | Status: DC
  Administered 2020-01-08 – 2020-01-10 (×3): 300 mg via ORAL
  Filled 2020-01-07 (×3): qty 2

## 2020-01-07 MED ORDER — ONDANSETRON HCL 4 MG/2ML IJ SOLN: 4.0000 mg | Freq: Four times a day (QID) | INTRAMUSCULAR | Status: DC | PRN

## 2020-01-07 MED ORDER — CEFAZOLIN SODIUM-DEXTROSE 2-4 GM/100ML-% IV SOLN: 2.0000 g | Freq: Four times a day (QID) | INTRAVENOUS | Status: DC

## 2020-01-07 MED ORDER — TRANEXAMIC ACID-NACL 1000-0.7 MG/100ML-% IV SOLN
1000.0000 mg | INTRAVENOUS | Status: AC
  Administered 2020-01-07: 1000 mg via INTRAVENOUS
  Filled 2020-01-07: qty 100

## 2020-01-07 MED ORDER — BUPIVACAINE IN DEXTROSE 0.75-8.25 % IT SOLN
INTRATHECAL | Status: DC | PRN
  Administered 2020-01-07: 2 mL via INTRATHECAL

## 2020-01-07 MED ORDER — ONDANSETRON HCL 4 MG/2ML IJ SOLN: 4.0000 mg | Freq: Once | INTRAMUSCULAR | Status: DC | PRN

## 2020-01-07 MED ORDER — OXYCODONE HCL 5 MG/5ML PO SOLN: 5.0000 mg | Freq: Once | ORAL | Status: DC | PRN

## 2020-01-07 MED ORDER — FENTANYL CITRATE (PF) 100 MCG/2ML IJ SOLN
INTRAMUSCULAR | Status: AC
  Filled 2020-01-07: qty 2

## 2020-01-07 MED ORDER — WATER FOR IRRIGATION, STERILE IR SOLN
Status: DC | PRN
  Administered 2020-01-07: 2000 mL

## 2020-01-07 MED ORDER — PHENOL 1.4 % MT LIQD: 1.0000 | OROMUCOSAL | Status: DC | PRN

## 2020-01-07 MED ORDER — DOCUSATE SODIUM 100 MG PO CAPS
100.0000 mg | ORAL_CAPSULE | Freq: Two times a day (BID) | ORAL | Status: DC
  Administered 2020-01-07 – 2020-01-10 (×6): 100 mg via ORAL
  Filled 2020-01-07 (×6): qty 1

## 2020-01-07 MED ORDER — PROPOFOL 10 MG/ML IV BOLUS
INTRAVENOUS | Status: AC
  Filled 2020-01-07: qty 20

## 2020-01-07 MED ORDER — MENTHOL 3 MG MT LOZG: 1.0000 | LOZENGE | OROMUCOSAL | Status: DC | PRN

## 2020-01-07 MED ORDER — PROPOFOL 1000 MG/100ML IV EMUL
INTRAVENOUS | Status: AC
  Filled 2020-01-07: qty 100

## 2020-01-07 MED ORDER — METHOCARBAMOL 500 MG IVPB - SIMPLE MED
500.0000 mg | Freq: Four times a day (QID) | INTRAVENOUS | Status: DC | PRN
  Filled 2020-01-07: qty 50

## 2020-01-07 MED ORDER — PHENYLEPHRINE 40 MCG/ML (10ML) SYRINGE FOR IV PUSH (FOR BLOOD PRESSURE SUPPORT)
PREFILLED_SYRINGE | INTRAVENOUS | Status: AC
  Filled 2020-01-07: qty 10

## 2020-01-07 MED ORDER — ACETAMINOPHEN 325 MG PO TABS
325.0000 mg | ORAL_TABLET | Freq: Four times a day (QID) | ORAL | Status: DC | PRN
  Administered 2020-01-09 – 2020-01-10 (×2): 650 mg via ORAL
  Filled 2020-01-07 (×2): qty 2

## 2020-01-07 MED ORDER — 0.9 % SODIUM CHLORIDE (POUR BTL) OPTIME
TOPICAL | Status: DC | PRN
  Administered 2020-01-07: 1000 mL

## 2020-01-07 MED ORDER — BUPIVACAINE-EPINEPHRINE (PF) 0.25% -1:200000 IJ SOLN
INTRAMUSCULAR | Status: DC | PRN
  Administered 2020-01-07: 30 mL

## 2020-01-07 MED ORDER — TRAMADOL HCL 50 MG PO TABS
50.0000 mg | ORAL_TABLET | Freq: Four times a day (QID) | ORAL | Status: DC | PRN
  Administered 2020-01-07 – 2020-01-09 (×4): 50 mg via ORAL
  Filled 2020-01-07: qty 1
  Filled 2020-01-07: qty 2
  Filled 2020-01-07 (×3): qty 1

## 2020-01-07 MED ORDER — AZILSARTAN-CHLORTHALIDONE 40-12.5 MG PO TABS: 1.0000 | ORAL_TABLET | Freq: Every day | ORAL | Status: DC

## 2020-01-07 MED ORDER — METHOCARBAMOL 500 MG PO TABS
500.0000 mg | ORAL_TABLET | Freq: Four times a day (QID) | ORAL | Status: DC | PRN
  Administered 2020-01-08 – 2020-01-10 (×4): 500 mg via ORAL
  Filled 2020-01-07 (×5): qty 1

## 2020-01-07 MED ORDER — ALBUTEROL SULFATE HFA 108 (90 BASE) MCG/ACT IN AERS: 2.0000 | INHALATION_SPRAY | Freq: Four times a day (QID) | RESPIRATORY_TRACT | Status: DC | PRN

## 2020-01-07 MED ORDER — MAGNESIUM CITRATE PO SOLN: 1.0000 | Freq: Once | ORAL | Status: DC | PRN

## 2020-01-07 MED ORDER — CHLORTHALIDONE 25 MG PO TABS
12.5000 mg | ORAL_TABLET | Freq: Every day | ORAL | Status: DC
  Administered 2020-01-08 – 2020-01-10 (×3): 12.5 mg via ORAL
  Filled 2020-01-07 (×3): qty 1

## 2020-01-07 SURGICAL SUPPLY — 46 items
BAG DECANTER FOR FLEXI CONT (MISCELLANEOUS) IMPLANT
BAG SPEC THK2 15X12 ZIP CLS (MISCELLANEOUS)
BAG ZIPLOCK 12X15 (MISCELLANEOUS) IMPLANT
BLADE SAG 18X100X1.27 (BLADE) ×2 IMPLANT
CLSR STERI-STRIP ANTIMIC 1/2X4 (GAUZE/BANDAGES/DRESSINGS) ×1 IMPLANT
COVER PERINEAL POST (MISCELLANEOUS) ×2 IMPLANT
COVER SURGICAL LIGHT HANDLE (MISCELLANEOUS) ×2 IMPLANT
COVER WAND RF STERILE (DRAPES) IMPLANT
CUP ACET PINNACLE SECTR 50MM (Hips) IMPLANT
DECANTER SPIKE VIAL GLASS SM (MISCELLANEOUS) ×2 IMPLANT
DRAPE STERI IOBAN 125X83 (DRAPES) ×2 IMPLANT
DRAPE U-SHAPE 47X51 STRL (DRAPES) ×4 IMPLANT
DRSG ADAPTIC 3X8 NADH LF (GAUZE/BANDAGES/DRESSINGS) ×2 IMPLANT
DRSG AQUACEL AG ADV 3.5X10 (GAUZE/BANDAGES/DRESSINGS) ×2 IMPLANT
DURAPREP 26ML APPLICATOR (WOUND CARE) ×2 IMPLANT
ELECT REM PT RETURN 15FT ADLT (MISCELLANEOUS) ×2 IMPLANT
EVACUATOR 1/8 PVC DRAIN (DRAIN) IMPLANT
GLOVE BIO SURGEON STRL SZ 6 (GLOVE) IMPLANT
GLOVE BIO SURGEON STRL SZ7 (GLOVE) IMPLANT
GLOVE BIO SURGEON STRL SZ8 (GLOVE) ×2 IMPLANT
GLOVE BIOGEL PI IND STRL 6.5 (GLOVE) IMPLANT
GLOVE BIOGEL PI IND STRL 7.0 (GLOVE) IMPLANT
GLOVE BIOGEL PI IND STRL 8 (GLOVE) ×1 IMPLANT
GLOVE BIOGEL PI INDICATOR 6.5 (GLOVE)
GLOVE BIOGEL PI INDICATOR 7.0 (GLOVE)
GLOVE BIOGEL PI INDICATOR 8 (GLOVE) ×1
GOWN STRL REUS W/TWL LRG LVL3 (GOWN DISPOSABLE) ×2 IMPLANT
GOWN STRL REUS W/TWL XL LVL3 (GOWN DISPOSABLE) IMPLANT
HEAD FEM STD 32X+1 STRL (Hips) ×1 IMPLANT
HOLDER FOLEY CATH W/STRAP (MISCELLANEOUS) ×2 IMPLANT
KIT TURNOVER KIT A (KITS) IMPLANT
LINER MARATHON 32 50 (Hips) ×2 IMPLANT
MANIFOLD NEPTUNE II (INSTRUMENTS) ×2 IMPLANT
PACK ANTERIOR HIP CUSTOM (KITS) ×2 IMPLANT
PENCIL SMOKE EVACUATOR COATED (MISCELLANEOUS) ×2 IMPLANT
PINNACLE SECTOR CUP 50MM (Hips) ×2 IMPLANT
STEM FEMORAL SZ6 HIGH ACTIS (Stem) ×1 IMPLANT
STRIP CLOSURE SKIN 1/2X4 (GAUZE/BANDAGES/DRESSINGS) ×2 IMPLANT
SUT ETHIBOND NAB CT1 #1 30IN (SUTURE) ×2 IMPLANT
SUT MNCRL AB 4-0 PS2 18 (SUTURE) ×2 IMPLANT
SUT STRATAFIX 0 PDS 27 VIOLET (SUTURE) ×2
SUT VIC AB 2-0 CT1 27 (SUTURE) ×4
SUT VIC AB 2-0 CT1 TAPERPNT 27 (SUTURE) ×2 IMPLANT
SUTURE STRATFX 0 PDS 27 VIOLET (SUTURE) ×1 IMPLANT
SYR 50ML LL SCALE MARK (SYRINGE) IMPLANT
TRAY FOLEY MTR SLVR 16FR STAT (SET/KITS/TRAYS/PACK) ×2 IMPLANT

## 2020-01-07 NOTE — Evaluation (Signed)
Physical Therapy Evaluation Patient Details Name: Isaiah Murphy MRN: [id number] DOB: 07/26/1934 Today's Date: 01/07/2020   History of Present Illness  Pt is 85 yo male admitted for L anterior THA on 01/07/20.  He has pmh including but not limited to R THA, pacemaker, HTN, anemia.  Clinical Impression  Pt is s/p L anterior THA resulting in the deficits listed below (see PT Problem List). Pt is normally independent and enjoys playing tennis.  He resides with his wife who is unable to physically assist with transfers.  Pt did well with therapy on DOS.  He required min A for L LE with transfers and was able to ambulate in hallway with RW and min guard.  Pt is expected to progress well and with excellent rehab potential. Pt will benefit from skilled PT to increase their independence and safety with mobility to allow discharge to the venue listed below.   Pt and pt's daughter report plan is for pt to return home and they want HHPT and a HH aide (they understand aide may be private pay) at discharge - notified case manager.      Follow Up Recommendations Follow surgeon's recommendation for DC plan and follow-up therapies;Supervision/Assistance - 24 hour (Pt reports wants HHPT and HH aide)    Equipment Recommendations  None recommended by PT (has RW and 3 in 1)    Recommendations for Other Services       Precautions / Restrictions Precautions Precautions: Fall Restrictions Weight Bearing Restrictions: Yes LLE Weight Bearing: Weight bearing as tolerated      Mobility  Bed Mobility Overal bed mobility: Needs Assistance Bed Mobility: Supine to Sit;Sit to Supine     Supine to sit: Min assist Sit to supine: Min assist   General bed mobility comments: min A for R LE with cues for sequencing and to complete scoot forward    Transfers Overall transfer level: Needs assistance Equipment used: Rolling walker (2 wheeled) Transfers: Sit to/from Stand Sit to Stand: Min assist;From elevated  surface            Ambulation/Gait Ambulation/Gait assistance: Min guard Gait Distance (Feet): 75 Feet Assistive device: Rolling walker (2 wheeled) Gait Pattern/deviations: Step-to pattern;Decreased stride length;Decreased weight shift to left;Shuffle Gait velocity: decreased   General Gait Details: Pt initially requiring assist to advance L LE but after a few steps was able to advance on his own but no lift off ground; cues for posture and RW proximity  Stairs            Wheelchair Mobility    Modified Rankin (Stroke Patients Only)       Balance Overall balance assessment: Needs assistance Sitting-balance support: No upper extremity supported;Feet supported Sitting balance-Leahy Scale: Good     Standing balance support: Bilateral upper extremity supported;No upper extremity supported Standing balance-Leahy Scale: Fair Standing balance comment: RW for ambulation ;no AD static stand                             Pertinent Vitals/Pain Pain Assessment: 0-10 Pain Score: 3  Pain Location: L hip Pain Descriptors / Indicators: Discomfort;Sore Pain Intervention(s): Limited activity within patient's tolerance;Monitored during session;Premedicated before session;Repositioned;Ice applied    Home Living Family/patient expects to be discharged to:: Private residence Living Arrangements: Spouse/significant other Available Help at Discharge: Family;Available 24 hours/day (wife is available but she is elderly; daughter is 30 mins away) Type of Home: House Home Access: Level entry  Home Layout: Multi-level;Able to live on main level with bedroom/bathroom Home Equipment: Bedside commode;Walker - 2 wheels;Grab bars - tub/shower Additional Comments: Daughter on phone and reports plan is for HHPT and HH aide    Prior Function Level of Independence: Needs assistance   Gait / Transfers Assistance Needed: Independent with community ambulation - typically without AD  but has been using RW last few weeks due to hip pain  ADL's / Homemaking Assistance Needed: Independent  Comments: Likes to play tennis     Hand Dominance        Extremity/Trunk Assessment   Upper Extremity Assessment Upper Extremity Assessment: Overall WFL for tasks assessed    Lower Extremity Assessment Lower Extremity Assessment: LLE deficits/detail;RLE deficits/detail RLE Deficits / Details: ROM WFL; MMT 5/5 LLE Deficits / Details: ROM WFL; MMT ankle 5/5, knee 3/5, hip 1/ 5 LLE Sensation: WNL    Cervical / Trunk Assessment Cervical / Trunk Assessment: Normal  Communication   Communication: No difficulties  Cognition Arousal/Alertness: Awake/alert Behavior During Therapy: WFL for tasks assessed/performed Overall Cognitive Status: Within Functional Limits for tasks assessed                                        General Comments      Exercises Total Joint Exercises Ankle Circles/Pumps: AROM;Both;10 reps;Supine Quad Sets: AROM;Both;10 reps;Supine   Assessment/Plan    PT Assessment Patient needs continued PT services  PT Problem List Decreased strength;Decreased mobility;Decreased safety awareness;Decreased range of motion;Decreased activity tolerance;Decreased balance;Decreased knowledge of use of DME;Pain       PT Treatment Interventions DME instruction;Therapeutic activities;Modalities;Gait training;Therapeutic exercise;Patient/family education;Stair training;Balance training;Functional mobility training    PT Goals (Current goals can be found in the Care Plan section)  Acute Rehab PT Goals Patient Stated Goal: return home PT Goal Formulation: With patient/family Time For Goal Achievement: 01/21/20 Potential to Achieve Goals: Good    Frequency 7X/week   Barriers to discharge Decreased caregiver support wife elderly unable to provide physical assist ; pt and daughter reports plan is to get private pay [facility] aide a few hours per day     Co-evaluation               AM-PAC PT "6 Clicks" Mobility  Outcome Measure Help needed turning from your back to your side while in a flat bed without using bedrails?: A Little Help needed moving from lying on your back to sitting on the side of a flat bed without using bedrails?: A Little Help needed moving to and from a bed to a chair (including a wheelchair)?: A Little Help needed standing up from a chair using your arms (e.g., wheelchair or bedside chair)?: A Little Help needed to walk in hospital room?: A Little Help needed climbing 3-5 steps with a railing? : A Lot 6 Click Score: 17    End of Session Equipment Utilized During Treatment: Gait belt Activity Tolerance: Patient tolerated treatment well Patient left: in bed;with call bell/phone within reach;with bed alarm set;with family/visitor present Nurse Communication: Mobility status PT Visit Diagnosis: Other abnormalities of gait and mobility (R26.89);Muscle weakness (generalized) (M62.81);Pain Pain - Right/Left: Left Pain - part of body: Hip    Time: 3875-6433 PT Time Calculation (min) (ACUTE ONLY): 36 min   Charges:   PT Evaluation $PT Eval Moderate Complexity: 1 Mod PT Treatments $Gait Training: 8-22 mins        Anya Murphey,  PT Acute Rehab Services Pager [phone] [facility] [phone]    Rayetta Humphrey 01/07/2020, 3:19 PM

## 2020-01-07 NOTE — Plan of Care (Signed)
Plan of care reviewed and discussed with the patient. 

## 2020-01-07 NOTE — Discharge Instructions (Signed)
Frank Aluisio, MD Total Joint Specialist [facility] [address] [place], [place] [address] [phone]  ANTERIOR APPROACH TOTAL HIP REPLACEMENT POSTOPERATIVE DIRECTIONS     Hip Rehabilitation, Guidelines Following Surgery  The results of a hip operation are greatly improved after range of motion and muscle strengthening exercises. Follow all safety measures which are given to protect your hip. If any of these exercises cause increased pain or swelling in your joint, decrease the amount until you are comfortable again. Then slowly increase the exercises. Call your caregiver if you have problems or questions.   HOME CARE INSTRUCTIONS  . Remove items at home which could result in a fall. This includes throw rugs or furniture in walking pathways.   ICE to the affected hip as frequently as 20-30 minutes an hour and then as needed for pain and swelling. Continue to use ice on the hip for pain and swelling from surgery. You may notice swelling that will progress down to the foot and ankle. This is normal after surgery. Elevate the leg when you are not up walking on it.    Continue to use the breathing machine which will help keep your temperature down.  It is common for your temperature to cycle up and down following surgery, especially at night when you are not up moving around and exerting yourself.  The breathing machine keeps your lungs expanded and your temperature down.  DIET You may resume your previous home diet once your are discharged from the hospital.  DRESSING / WOUND CARE / SHOWERING . You have an adhesive waterproof bandage over the incision. Leave this in place until your first follow-up appointment. Once you remove this you will not need to place another bandage.  . You may begin showering 3 days following surgery, but do not submerge the incision under water.  ACTIVITY . For the first 3-5 days, it is important to rest and keep the operative  leg elevated. You should, as a general rule, rest for 50 minutes and walk/stretch for 10 minutes per hour. After 5 days, you may slowly increase activity as tolerated.  . Perform the exercises you were provided twice a day for about 15-20 minutes each session. Begin these 2 days following surgery. . Walk with your walker as instructed. Use the walker until you are comfortable transitioning to a cane. Walk with the cane in the opposite hand of the operative leg. You may discontinue the cane once you are comfortable and walking steadily. . Avoid periods of inactivity such as sitting longer than an hour when not asleep. This helps prevent blood clots.  . Do not drive a car for 6 weeks or until released by your surgeon.  . Do not drive while taking narcotics.  TED HOSE STOCKINGS Wear the elastic stockings on both legs for three weeks following surgery during the day. You may remove them at night while sleeping.  WEIGHT BEARING Weight bearing as tolerated with assist device (walker, cane, etc) as directed, use it as long as suggested by your surgeon or therapist, typically at least 4-6 weeks.  POSTOPERATIVE CONSTIPATION PROTOCOL Constipation - defined medically as fewer than three stools per week and severe constipation as less than one stool per week.  One of the most common issues patients have following surgery is constipation.  Even if you have a regular bowel pattern at home, your normal regimen is likely to be disrupted due to multiple reasons following surgery.  Combination of anesthesia, postoperative narcotics,   change in appetite and fluid intake all can affect your bowels.  In order to avoid complications following surgery, here are some recommendations in order to help you during your recovery period.  . Colace (docusate) - Pick up an over-the-counter form of Colace or another stool softener and take twice a day as long as you are requiring postoperative pain medications.  Take with a full  glass of water daily.  If you experience loose stools or diarrhea, hold the colace until you stool forms back up.  If your symptoms do not get better within 1 week or if they get worse, check with your doctor. . Dulcolax (bisacodyl) - Pick up over-the-counter and take as directed by the product packaging as needed to assist with the movement of your bowels.  Take with a full glass of water.  Use this product as needed if not relieved by Colace only.  . MiraLax (polyethylene glycol) - Pick up over-the-counter to have on hand.  MiraLax is a solution that will increase the amount of water in your bowels to assist with bowel movements.  Take as directed and can mix with a glass of water, juice, soda, coffee, or tea.  Take if you go more than two days without a movement.Do not use MiraLax more than once per day. Call your doctor if you are still constipated or irregular after using this medication for 7 days in a row.  If you continue to have problems with postoperative constipation, please contact the office for further assistance and recommendations.  If you experience "the worst abdominal pain ever" or develop nausea or vomiting, please contact the office immediatly for further recommendations for treatment.  ITCHING  If you experience itching with your medications, try taking only a single pain pill, or even half a pain pill at a time.  You can also use Benadryl over the counter for itching or also to help with sleep.   MEDICATIONS See your medication summary on the "After Visit Summary" that the nursing staff will review with you prior to discharge.  You may have some home medications which will be placed on hold until you complete the course of blood thinner medication.  It is important for you to complete the blood thinner medication as prescribed by your surgeon.  Continue your approved medications as instructed at time of discharge.  PRECAUTIONS If you experience chest pain or shortness of breath -  call 911 immediately for transfer to the hospital emergency department.  If you develop a fever greater that 101 F, purulent drainage from wound, increased redness or drainage from wound, foul odor from the wound/dressing, or calf pain - CONTACT YOUR SURGEON.                                                   FOLLOW-UP APPOINTMENTS Make sure you keep all of your appointments after your operation with your surgeon and caregivers. You should call the office at the above phone number and make an appointment for approximately two weeks after the date of your surgery or on the date instructed by your surgeon outlined in the "After Visit Summary".  RANGE OF MOTION AND STRENGTHENING EXERCISES  These exercises are designed to help you keep full movement of your hip joint. Follow your caregiver's or physical therapist's instructions. Perform all exercises about fifteen times, three   times per day or as directed. Exercise both hips, even if you have had only one joint replacement. These exercises can be done on a training (exercise) mat, on the floor, on a table or on a bed. Use whatever works the best and is most comfortable for you. Use music or television while you are exercising so that the exercises are a pleasant break in your day. This will make your life better with the exercises acting as a break in routine you can look forward to.  . Lying on your back, slowly slide your foot toward your buttocks, raising your knee up off the floor. Then slowly slide your foot back down until your leg is straight again.  . Lying on your back spread your legs as far apart as you can without causing discomfort.  . Lying on your side, raise your upper leg and foot straight up from the floor as far as is comfortable. Slowly lower the leg and repeat.  . Lying on your back, tighten up the muscle in the front of your thigh (quadriceps muscles). You can do this by keeping your leg straight and trying to raise your heel off the  floor. This helps strengthen the largest muscle supporting your knee.  . Lying on your back, tighten up the muscles of your buttocks both with the legs straight and with the knee bent at a comfortable angle while keeping your heel on the floor.   IF YOU ARE TRANSFERRED TO A SKILLED REHAB FACILITY If the patient is transferred to a skilled rehab facility following release from the hospital, a list of the current medications will be sent to the facility for the patient to continue.  When discharged from the skilled rehab facility, please have the facility set up the patient's Home Health Physical Therapy prior to being released. Also, the skilled facility will be responsible for providing the patient with their medications at time of release from the facility to include their pain medication, the muscle relaxants, and their blood thinner medication. If the patient is still at the rehab facility at time of the two week follow up appointment, the skilled rehab facility will also need to assist the patient in arranging follow up appointment in our office and any transportation needs.  MAKE SURE YOU:  . Understand these instructions.  . Get help right away if you are not doing well or get worse.    DENTAL ANTIBIOTICS:  In most cases prophylactic antibiotics for Dental procdeures after total joint surgery are not necessary.  Exceptions are as follows:  1. History of prior total joint infection  2. Severely immunocompromised (Organ Transplant, cancer chemotherapy, Rheumatoid biologic meds such as Humera)  3. Poorly controlled diabetes (A1C &gt; 8.0, blood glucose over 200)  If you have one of these conditions, contact your surgeon for an antibiotic prescription, prior to your dental procedure.    Pick up stool softner and laxative for home use following surgery while on pain medications. Do not submerge incision under water. Please use good hand washing techniques while changing dressing each  day. May shower starting three days after surgery. Please use a clean towel to pat the incision dry following showers. Continue to use ice for pain and swelling after surgery. Do not use any lotions or creams on the incision until instructed by your surgeon.  

## 2020-01-07 NOTE — Transfer of Care (Signed)
Immediate Anesthesia Transfer of Care Note  Patient: Isaiah Murphy  Procedure(s) Performed: TOTAL HIP ARTHROPLASTY ANTERIOR APPROACH (Left Hip)  Patient Location: PACU  Anesthesia Type:Spinal  Level of Consciousness: sedated  Airway & Oxygen Therapy: Patient Spontanous Breathing and Patient connected to face mask oxygen  Post-op Assessment: Report given to RN and Post -op Vital signs reviewed and stable  Post vital signs: Reviewed and stable  Last Vitals:  Vitals Value Taken Time  BP    Temp    Pulse 71 01/07/20 1010  Resp 18 01/07/20 1010  SpO2 100 % 01/07/20 1010  Vitals shown include unvalidated device data.  Last Pain:  Vitals:   01/07/20 0658  TempSrc:   PainSc: 0-No pain      Patients Stated Pain Goal: 3 (01/07/20 1017)  Complications: No complications documented.

## 2020-01-07 NOTE — Anesthesia Procedure Notes (Signed)
Spinal  Patient location during procedure: OR Start time: 01/07/2020 8:22 AM End time: 01/07/2020 8:26 AM Staffing Anesthesiologist: Bethena Midget, MD Preanesthetic Checklist Completed: patient identified, IV checked, site marked, risks and benefits discussed, surgical consent, monitors and equipment checked, pre-op evaluation and timeout performed Spinal Block Patient position: sitting Prep: DuraPrep Patient monitoring: heart rate, cardiac monitor, continuous pulse ox and blood pressure Approach: midline Location: L3-4 Injection technique: single-shot Needle Needle type: Sprotte  Needle gauge: 24 G Needle length: 9 cm Assessment Sensory level: T4

## 2020-01-07 NOTE — Interval H&P Note (Signed)
History and Physical Interval Note:  01/07/2020 6:57 AM  Isaiah Murphy  has presented today for surgery, with the diagnosis of left hip osteoarthritis.  The various methods of treatment have been discussed with the patient and family. After consideration of risks, benefits and other options for treatment, the patient has consented to  Procedure(s) with comments: TOTAL HIP ARTHROPLASTY ANTERIOR APPROACH (Left) - as a surgical intervention.  The patient's history has been reviewed, patient examined, no change in status, stable for surgery.  I have reviewed the patient's chart and labs.  Questions were answered to the patient's satisfaction.     Homero Fellers Sahvanna Mcmanigal

## 2020-01-07 NOTE — Progress Notes (Signed)
Pt. s/u with and placed on CPAP Auto of <6/>18, using own hose/FFMask with 2 lpm oxygen added to circuit, tolerating well, made aware to notify if needed.

## 2020-01-07 NOTE — Anesthesia Postprocedure Evaluation (Signed)
Anesthesia Post Note  Patient: Isaiah Murphy  Procedure(s) Performed: TOTAL HIP ARTHROPLASTY ANTERIOR APPROACH (Left Hip)     Patient location during evaluation: PACU Anesthesia Type: MAC Level of consciousness: oriented and awake and alert Pain management: pain level controlled Vital Signs Assessment: post-procedure vital signs reviewed and stable Respiratory status: spontaneous breathing, respiratory function stable and patient connected to nasal cannula oxygen Cardiovascular status: blood pressure returned to baseline and stable Postop Assessment: no headache, no backache and no apparent nausea or vomiting Anesthetic complications: no   No complications documented.  Last Vitals:  Vitals:   01/07/20 1015 01/07/20 1030  BP: 110/70 109/77  Pulse: 70 70  Resp: 16 12  Temp: 36.4 C   SpO2: 100% 100%    Last Pain:  Vitals:   01/07/20 1030  TempSrc:   PainSc: Asleep                 Kishana Battey

## 2020-01-07 NOTE — Op Note (Signed)
OPERATIVE REPORT- TOTAL HIP ARTHROPLASTY   PREOPERATIVE DIAGNOSIS: Osteoarthritis of the Left hip.   POSTOPERATIVE DIAGNOSIS: Osteoarthritis of the Left  hip.   PROCEDURE: Left total hip arthroplasty, anterior approach.   SURGEON: Ollen Gross, MD   ASSISTANT: Arther Abbott, PA-C  ANESTHESIA:  Spinal  ESTIMATED BLOOD LOSS:-450 mL    DRAINS: Hemovac x1.   COMPLICATIONS: None   CONDITION: PACU - hemodynamically stable.   BRIEF CLINICAL NOTE: Isaiah Murphy is a 85 y.o. male who has advanced end-  stage arthritis of their Left  hip with progressively worsening pain and  dysfunction.The patient has failed nonoperative management and presents for  total hip arthroplasty.   PROCEDURE IN DETAIL: After successful administration of spinal  anesthetic, the traction boots for the [facility] bed were placed on both  feet and the patient was placed onto the [facility] bed, boots placed into the leg  holders. The Left hip was then isolated from the perineum with plastic  drapes and prepped and draped in the usual sterile fashion. ASIS and  greater trochanter were marked and a oblique incision was made, starting  at about 1 cm lateral and 2 cm distal to the ASIS and coursing towards  the anterior cortex of the femur. The skin was cut with a 10 blade  through subcutaneous tissue to the level of the fascia overlying the  tensor fascia lata muscle. The fascia was then incised in line with the  incision at the junction of the anterior third and posterior 2/3rd. The  muscle was teased off the fascia and then the interval between the TFL  and the rectus was developed. The Hohmann retractor was then placed at  the top of the femoral neck over the capsule. The vessels overlying the  capsule were cauterized and the fat on top of the capsule was removed.  A Hohmann retractor was then placed anterior underneath the rectus  femoris to give exposure to the entire anterior capsule. A T-shaped   capsulotomy was performed. The edges were tagged and the femoral head  was identified.       Osteophytes are removed off the superior acetabulum.  The femoral neck was then cut in situ with an oscillating saw. Traction  was then applied to the left lower extremity utilizing the [facility]  traction. The femoral head was then removed. Retractors were placed  around the acetabulum and then circumferential removal of the labrum was  performed. Osteophytes were also removed. Reaming starts at 47 mm to  medialize and  Increased in 2 mm increments to 49 mm. We reamed in  approximately 40 degrees of abduction, 20 degrees anteversion. A 50 mm  pinnacle acetabular shell was then impacted in anatomic position under  fluoroscopic guidance with excellent purchase. We did not need to place  any additional dome screws. A 32 mm neutral + 4 marathon liner was then  placed into the acetabular shell.       The femoral lift was then placed along the lateral aspect of the femur  just distal to the vastus ridge. The leg was  externally rotated and capsule  was stripped off the inferior aspect of the femoral neck down to the  level of the lesser trochanter, this was done with electrocautery. The femur was lifted after this was performed. The  leg was then placed in an extended and adducted position essentially delivering the femur. We also removed the capsule superiorly and the piriformis from the piriformis  fossa to gain excellent exposure of the  proximal femur. Rongeur was used to remove some cancellous bone to get  into the lateral portion of the proximal femur for placement of the  initial starter reamer. The starter broaches was placed  the starter broach  and was shown to go down the center of the canal. Broaching  with the Actis system was then performed starting at size 0  coursing  Up to size 6. A size 6 had excellent torsional and rotational  and axial stability. The trial high offset neck was then placed   with a 32 + 1 trial head. The hip was then reduced. We confirmed that  the stem was in the canal both on AP and lateral x-rays. It also has excellent sizing. The hip was reduced with outstanding stability through full extension and full external rotation.. AP pelvis was taken and the leg lengths were measured and found to be equal. Hip was then dislocated again and the femoral head and neck removed. The  femoral broach was removed. Size 6 Actis stem with a high offset  neck was then impacted into the femur following native anteversion. Has  excellent purchase in the canal. Excellent torsional and rotational and  axial stability. It is confirmed to be in the canal on AP and lateral  fluoroscopic views. The 32 + 1 metal head was placed and the hip  reduced with outstanding stability. Again AP pelvis was taken and it  confirmed that the leg lengths were equal. The wound was then copiously  irrigated with saline solution and the capsule reattached and repaired  with Ethibond suture. 30 ml of .25% Bupivicaine was  injected into the capsule and into the edge of the tensor fascia lata as well as subcutaneous tissue. The fascia overlying the tensor fascia lata was then closed with a running #1 V-Loc. Subcu was closed with interrupted 2-0 Vicryl and subcuticular running 4-0 Monocryl. Incision was cleaned  and dried. Steri-Strips and a bulky sterile dressing applied. Hemovac  drain was hooked to suction and then the patient was awakened and transported to  recovery in stable condition.        Please note that a surgical assistant was a medical necessity for this procedure to perform it in a safe and expeditious manner. Assistant was necessary to provide appropriate retraction of vital neurovascular structures and to prevent femoral fracture and allow for anatomic placement of the prosthesis.  Gaynelle Arabian, M.D.

## 2020-01-07 NOTE — Anesthesia Procedure Notes (Signed)
Date/Time: 01/07/2020 8:31 AM Performed by: Jhonnie Garner, CRNA Oxygen Delivery Method: Simple face mask

## 2020-01-08 ENCOUNTER — Encounter (HOSPITAL_COMMUNITY): Payer: Self-pay | Admitting: Orthopedic Surgery

## 2020-01-08 DIAGNOSIS — Z96641 Presence of right artificial hip joint: Secondary | ICD-10-CM | POA: Diagnosis present

## 2020-01-08 DIAGNOSIS — E785 Hyperlipidemia, unspecified: Secondary | ICD-10-CM | POA: Diagnosis present

## 2020-01-08 DIAGNOSIS — I495 Sick sinus syndrome: Secondary | ICD-10-CM | POA: Diagnosis present

## 2020-01-08 DIAGNOSIS — Z950 Presence of cardiac pacemaker: Secondary | ICD-10-CM | POA: Diagnosis not present

## 2020-01-08 DIAGNOSIS — K219 Gastro-esophageal reflux disease without esophagitis: Secondary | ICD-10-CM | POA: Diagnosis present

## 2020-01-08 DIAGNOSIS — M1612 Unilateral primary osteoarthritis, left hip: Secondary | ICD-10-CM | POA: Diagnosis present

## 2020-01-08 DIAGNOSIS — I480 Paroxysmal atrial fibrillation: Secondary | ICD-10-CM | POA: Diagnosis present

## 2020-01-08 DIAGNOSIS — Z79899 Other long term (current) drug therapy: Secondary | ICD-10-CM | POA: Diagnosis not present

## 2020-01-08 DIAGNOSIS — Z880 Allergy status to penicillin: Secondary | ICD-10-CM | POA: Diagnosis not present

## 2020-01-08 DIAGNOSIS — I10 Essential (primary) hypertension: Secondary | ICD-10-CM | POA: Diagnosis present

## 2020-01-08 DIAGNOSIS — J4540 Moderate persistent asthma, uncomplicated: Secondary | ICD-10-CM | POA: Diagnosis present

## 2020-01-08 DIAGNOSIS — M25552 Pain in left hip: Secondary | ICD-10-CM | POA: Diagnosis present

## 2020-01-08 DIAGNOSIS — Z96642 Presence of left artificial hip joint: Secondary | ICD-10-CM

## 2020-01-08 DIAGNOSIS — Z7902 Long term (current) use of antithrombotics/antiplatelets: Secondary | ICD-10-CM | POA: Diagnosis not present

## 2020-01-08 DIAGNOSIS — Z8249 Family history of ischemic heart disease and other diseases of the circulatory system: Secondary | ICD-10-CM | POA: Diagnosis not present

## 2020-01-08 DIAGNOSIS — I483 Typical atrial flutter: Secondary | ICD-10-CM | POA: Diagnosis present

## 2020-01-08 DIAGNOSIS — M25752 Osteophyte, left hip: Secondary | ICD-10-CM | POA: Diagnosis present

## 2020-01-08 LAB — CBC
HCT: 35.4 % — ABNORMAL LOW (ref 39.0–52.0)
Hemoglobin: 12.3 g/dL — ABNORMAL LOW (ref 13.0–17.0)
MCH: 33.9 pg (ref 26.0–34.0)
MCHC: 34.7 g/dL (ref 30.0–36.0)
MCV: 97.5 fL (ref 80.0–100.0)
Platelets: 156 10*3/uL (ref 150–400)
RBC: 3.63 MIL/uL — ABNORMAL LOW (ref 4.22–5.81)
RDW: 12.5 % (ref 11.5–15.5)
WBC: 15.7 10*3/uL — ABNORMAL HIGH (ref 4.0–10.5)
nRBC: 0 % (ref 0.0–0.2)

## 2020-01-08 LAB — BASIC METABOLIC PANEL
Anion gap: 8 (ref 5–15)
BUN: 19 mg/dL (ref 8–23)
CO2: 24 mmol/L (ref 22–32)
Calcium: 8 mg/dL — ABNORMAL LOW (ref 8.9–10.3)
Chloride: 99 mmol/L (ref 98–111)
Creatinine, Ser: 1.12 mg/dL (ref 0.61–1.24)
GFR, Estimated: >60 mL/min (ref >60)
Glucose, Bld: 148 mg/dL — ABNORMAL HIGH (ref 70–99)
Potassium: 3.9 mmol/L (ref 3.5–5.1)
Sodium: 131 mmol/L — ABNORMAL LOW (ref 135–145)

## 2020-01-08 NOTE — Progress Notes (Signed)
Physical Therapy Treatment Patient Details Name: Isaiah Murphy MRN: [id number] DOB: 05/12/1934 Today's Date: 01/08/2020    History of Present Illness Pt is 85 yo male admitted for L anterior THA on 01/07/20.  He has pmh including but not limited to R THA, pacemaker, HTN, anemia.    PT Comments    POD # 1 pm session Pt back in bed requesting to rest but did agree to TE's.  Performed THR TE's following HEP handout.  Instructed on proper tech and use of belt to self assist.  Pain was controlled 4/10.  Pt plans to D/C to home tomorrow. .    Follow Up Recommendations  Follow surgeon's recommendation for DC plan and follow-up therapies;Supervision/Assistance - 24 hour     Equipment Recommendations  None recommended by PT    Recommendations for Other Services       Precautions / Restrictions Precautions Precautions: Fall Restrictions Weight Bearing Restrictions: No LLE Weight Bearing: Weight bearing as tolerated       Balance                                            Cognition Arousal/Alertness: Awake/alert Behavior During Therapy: WFL for tasks assessed/performed Overall Cognitive Status: Within Functional Limits for tasks assessed                                 General Comments: AxO x 3 very talkative this afternoon.  Required redirection to stay on task TE's.       Exercises      General Comments        Pertinent Vitals/Pain Pain Assessment: 0-10 Pain Score: 8  Pain Location: L hip with amb (hip flex) Pain Descriptors / Indicators: Discomfort;Sore;Operative site guarding;Tender;Throbbing Pain Intervention(s): Monitored during session;Premedicated before session;Repositioned;Ice applied    Home Living                      Prior Function            PT Goals (current goals can now be found in the care plan section) Progress towards PT goals: Progressing toward goals    Frequency    7X/week      PT Plan  Current plan remains appropriate    Co-evaluation              AM-PAC PT "6 Clicks" Mobility   Outcome Measure  Help needed turning from your back to your side while in a flat bed without using bedrails?: A Little Help needed moving from lying on your back to sitting on the side of a flat bed without using bedrails?: A Little Help needed moving to and from a bed to a chair (including a wheelchair)?: A Little Help needed standing up from a chair using your arms (e.g., wheelchair or bedside chair)?: A Little Help needed to walk in hospital room?: A Little Help needed climbing 3-5 steps with a railing? : A Lot 6 Click Score: 17    End of Session Equipment Utilized During Treatment: Gait belt Activity Tolerance: Patient tolerated treatment well Patient left: in bed;with bed alarm set;with call bell/phone within reach Nurse Communication: Mobility status PT Visit Diagnosis: Other abnormalities of gait and mobility (R26.89);Muscle weakness (generalized) (M62.81);Pain Pain - Right/Left: Left Pain - part of body: Hip  Time: 2952-8413 PT Time Calculation (min) (ACUTE ONLY): 25 min  Charges:  $Gait Training: 8-22 mins $Therapeutic Exercise: 23-37 mins                     {Daschel Roughton  PTA Acute  Rehabilitation Services Pager      [phone] Office      [phone]

## 2020-01-08 NOTE — TOC Transition Note (Signed)
Transition of Care [facility]) - CM/SW Discharge Note   Patient Details  Name: Isaiah Murphy MRN: [id number] Date of Birth: 07/21/1934  Transition of Care [facility]) CM/SW Contact:  Lia Hopping, [place] Phone Number: 01/08/2020, 11:20 AM   Clinical Narrative:    Re: HHPT, nurse aide, DME CSW met with the patient and spouse at bedside, and his daughter Brunetta Genera by phone. CSW discussed home health physical therapy and a nurse aide arrangements for post op care. CSW provided a list of Medicare.gov agencies. Daughter chose [facility]. CSW confirmed staff availability for both services. The [facility] agency will pair the patient with a personal care services as well. [facility] staff to discuss further with daughter Brunetta Genera. CSW inquired about DME. Patient confirm he has a RW and will benefit from a 3 in1. CSW made a referral to [employer]. 3 in 1 to be delivered to the patient bedside.  No other needs identified.   Final next level of care: Home w Home Health Services Barriers to Discharge: Continued Medical Work up   Patient Goals and CMS Choice   CMS Medicare.gov Compare Post Acute Care list provided to:: Patient Choice offered to / list presented to : Patient  Discharge Placement                       Discharge Plan and Services                DME Arranged: 3-N-1 DME Agency: [facility] Date DME Agency Contacted: 01/08/20 Time DME Agency Contacted: 1119 Representative spoke with at DME Agency: Freda Munro HH Arranged: PT, Nurse's Aide [place] Agency: [facility] [place] Date [place]: 01/08/20 Time [place]: 1116 Representative spoke with at [place]: [place] ([place]) Interventions     Readmission Risk Interventions No flowsheet data found.

## 2020-01-08 NOTE — Progress Notes (Signed)
   Subjective: 1 Day Post-Op Procedure(s) (LRB): TOTAL HIP ARTHROPLASTY ANTERIOR APPROACH (Left) Patient reports pain as mild.   Patient seen in rounds by Dr. Lequita Halt. Patient is well, and has had no acute complaints or problems. Patient denies SOB, chest pain, or calf pain. No acute overnight events. Patient did well with PT yesterday, successfully ambulating in hallway with rolling walker and minimal guard. Foley pulled this am.  We will continue therapy today.   Objective: Vital signs in last 24 hours: Temp:  [97.2 F (36.2 C)-98.3 F (36.8 C)] 98.3 F (36.8 C) (12/02 0542) Pulse Rate:  [68-71] 70 (12/02 0542) Resp:  [11-17] 16 (12/02 0542) BP: (90-136)/(54-105) 106/54 (12/02 0542) SpO2:  [98 %-100 %] 99 % (12/02 0542)  Intake/Output from previous day:  Intake/Output Summary (Last 24 hours) at 01/08/2020 0741 Last data filed at 01/08/2020 0542 Gross per 24 hour  Intake 3608.11 ml  Output 1715 ml  Net 1893.11 ml     Intake/Output this shift: No intake/output data recorded.  Labs: Recent Labs    01/08/20 0327  HGB 12.3*   Recent Labs    01/08/20 0327  WBC 15.7*  RBC 3.63*  HCT 35.4*  PLT 156   Recent Labs    01/08/20 0327  NA 131*  K 3.9  CL 99  CO2 24  BUN 19  CREATININE 1.12  GLUCOSE 148*  CALCIUM 8.0*   No results for input(s): LABPT, INR in the last 72 hours.  Exam: General - Patient is Alert and Oriented Extremity - Neurologically intact Neurovascular intact Sensation intact distally Intact pulses distally Dressing - dressing C/D/I Motor Function - intact, moving foot and toes well on exam.   Past Medical History:  Diagnosis Date  . Acute blood loss anemia   . Allergic rhinitis   . Arthritis   . Constipation   . Dyslipidemia   . Dysrhythmia    history of paroxysmal A Fib and typical A Flutter   . GERD (gastroesophageal reflux disease)   . History of bronchitis   . History of sick sinus syndrome   . History of syncope   . HLD  (hyperlipidemia)   . Hypertension   . Hyponatremia   . Leukocytosis   . Moderate persistent asthma with acute exacerbation in adult   . Presence of permanent cardiac pacemaker   . Primary osteoarthritis of right hip   . Sleep apnea    pt states not currently using CPAP machine   . Unsteady gait     Assessment/Plan: 1 Day Post-Op Procedure(s) (LRB): TOTAL HIP ARTHROPLASTY ANTERIOR APPROACH (Left) Active Problems:   Primary osteoarthritis of left hip  Estimated body mass index is 28.32 kg/m as calculated from the following:   Height as of this encounter: 5\' 9"  (1.753 m).   Weight as of this encounter: 87 kg. Advance diet Up with therapy Plan for discharge tomorrow  DVT Prophylaxis - Eliquis Weight bearing as tolerated. Will be seen by physical therapy twice today, and again on Friday, and if meeting goals at that time, will be likely discharged.   The PDMP database was reviewed today prior to any opioid medications being prescribed to this patient.  Plan is to Home after hospital stay, and will be seen by home health physical therapy services as well as home health aide.  Patient to follow up in clinic in two weeks on December 14.   December 16, MBA, PA-C Orthopedic Surgery 01/08/2020, 7:41 AM

## 2020-01-08 NOTE — Progress Notes (Signed)
Physical Therapy Treatment Patient Details Name: Isaiah Murphy MRN: [id number] DOB: 05/20/1934 Today's Date: 01/08/2020    History of Present Illness Pt is 85 yo male admitted for L anterior THA on 01/07/20.  He has pmh including but not limited to R THA, pacemaker, HTN, anemia.    PT Comments    POD # 1 am session General Comments: AxO x 3 but slow today/required increased time.  Sleepy.  Assisted OOB to amb required increased time this session  General bed mobility comments: demonstarted and instructed on how to use a safety belt to self assist LE off bed.  Required increased time x 2 nearly 5 min to complete.  Pt repeated "wait", "give me time to think about it"  General transfer comment: 83% VC's on proper hand placement and safety with turns (slow processing this session)  General Gait Details: slow moving and with poor forward flex posture.  Pt was unable to adnce L LE on his own.  Required physical assist to advance L LE first 12 feet.  Decreased distance this session due to fatigue, effort.  Required increased time x 2 nearly 12 min to advance only 22 feet.  Recliner following behind for safety. Returned to room in recliner then performed a few TE's followed by ICE. Marland Kitchen      Follow Up Recommendations  Follow surgeons recommendation for DC plan and follow-up therapies;Supervision/Assistance - 24 hour     Equipment Recommendations  None recommended by PT    Recommendations for Other Services       Precautions / Restrictions Precautions Precautions: Fall Restrictions Weight Bearing Restrictions: No LLE Weight Bearing: Weight bearing as tolerated    Mobility  Bed Mobility Overal bed mobility: Needs Assistance Bed Mobility: Supine to Sit     Supine to sit: Min assist     General bed mobility comments: demonstarted and instructed on how to use a safety belt to self assist LE off bed.  Required increased time x 2 nearly 5 min to complete.  Pt repeated "wait", "give me time  to think about it"  Transfers Overall transfer level: Needs assistance Equipment used: Rolling walker (2 wheeled) Transfers: Sit to/from Stand Sit to Stand: Min assist         General transfer comment: 83% VC's on proper hand placement and safety with turns (slow processing this session)  Ambulation/Gait Ambulation/Gait assistance: Min assist Gait Distance (Feet): 22 Feet Assistive device: Rolling walker (2 wheeled) Gait Pattern/deviations: Step-to pattern;Decreased stride length;Decreased weight shift to left;Shuffle Gait velocity: decreased   General Gait Details: slow moving and with poor forward flex posture.  Pt was unable to adnce L LE on his own.  Required physical assist to advance L LE first 12 feet.  Decreased distance this session due to fatigue, effort.  Required increased time x 2 nearly 12 min to advance only 22 feet.  Recliner following behind for safety.   Stairs             Wheelchair Mobility    Modified Rankin (Stroke Patients Only)       Balance                                            Cognition Arousal/Alertness: Awake/alert Behavior During Therapy: WFL for tasks assessed/performed Overall Cognitive Status: Within Functional Limits for tasks assessed  General Comments: AxO x 3 but slow today/required increased time      Exercises  20 reps AP 10 reps knee presses 5 reps HS    General Comments        Pertinent Vitals/Pain Pain Assessment: 0-10 Pain Score: 8  Pain Location: L hip with amb (hip flex) Pain Descriptors / Indicators: Discomfort;Sore;Operative site guarding;Tender;Throbbing Pain Intervention(s): Monitored during session;Premedicated before session;Repositioned;Ice applied    Home Living                      Prior Function            PT Goals (current goals can now be found in the care plan section) Progress towards PT goals: Progressing  toward goals    Frequency    7X/week      PT Plan Current plan remains appropriate    Co-evaluation              AM-PAC PT "6 Clicks" Mobility   Outcome Measure  Help needed turning from your back to your side while in a flat bed without using bedrails?: A Little Help needed moving from lying on your back to sitting on the side of a flat bed without using bedrails?: A Little Help needed moving to and from a bed to a chair (including a wheelchair)?: A Little Help needed standing up from a chair using your arms (e.g., wheelchair or bedside chair)?: A Little Help needed to walk in hospital room?: A Little Help needed climbing 3-5 steps with a railing? : A Lot 6 Click Score: 17    End of Session Equipment Utilized During Treatment: Gait belt Activity Tolerance: Patient limited by fatigue;No increased pain Patient left: in chair;with call bell/phone within reach;with chair alarm set;with family/visitor present Nurse Communication: Mobility status PT Visit Diagnosis: Other abnormalities of gait and mobility (R26.89);Muscle weakness (generalized) (M62.81);Pain Pain - Right/Left: Left Pain - part of body: Hip     Time: 6967-8938 PT Time Calculation (min) (ACUTE ONLY): 33 min  Charges:  $Gait Training: 8-22 mins $Therapeutic Exercise: 8-22 mins                     Felecia Shelling  PTA Acute  Rehabilitation Services Pager      [phone] Office      [phone]

## 2020-01-09 LAB — CBC
HCT: 35.2 % — ABNORMAL LOW (ref 39.0–52.0)
Hemoglobin: 12 g/dL — ABNORMAL LOW (ref 13.0–17.0)
MCH: 32.8 pg (ref 26.0–34.0)
MCHC: 34.1 g/dL (ref 30.0–36.0)
MCV: 96.2 fL (ref 80.0–100.0)
Platelets: 155 10*3/uL (ref 150–400)
RBC: 3.66 MIL/uL — ABNORMAL LOW (ref 4.22–5.81)
RDW: 12.6 % (ref 11.5–15.5)
WBC: 16.8 10*3/uL — ABNORMAL HIGH (ref 4.0–10.5)
nRBC: 0 % (ref 0.0–0.2)

## 2020-01-09 LAB — BASIC METABOLIC PANEL
Anion gap: 7 (ref 5–15)
BUN: 19 mg/dL (ref 8–23)
CO2: 26 mmol/L (ref 22–32)
Calcium: 8.1 mg/dL — ABNORMAL LOW (ref 8.9–10.3)
Chloride: 97 mmol/L — ABNORMAL LOW (ref 98–111)
Creatinine, Ser: 1.07 mg/dL (ref 0.61–1.24)
GFR, Estimated: >60 mL/min (ref >60)
Glucose, Bld: 133 mg/dL — ABNORMAL HIGH (ref 70–99)
Potassium: 3.9 mmol/L (ref 3.5–5.1)
Sodium: 130 mmol/L — ABNORMAL LOW (ref 135–145)

## 2020-01-09 MED ORDER — APIXABAN 5 MG PO TABS
5.0000 mg | ORAL_TABLET | Freq: Two times a day (BID) | ORAL | Status: DC
  Administered 2020-01-10: 5 mg via ORAL
  Filled 2020-01-09 (×2): qty 1

## 2020-01-09 MED ORDER — METHOCARBAMOL 500 MG PO TABS: 500.0000 mg | ORAL_TABLET | Freq: Four times a day (QID) | ORAL | 0 refills | Status: DC | PRN

## 2020-01-09 MED ORDER — APIXABAN 2.5 MG PO TABS
2.5000 mg | ORAL_TABLET | Freq: Two times a day (BID) | ORAL | Status: AC
  Administered 2020-01-09 (×2): 2.5 mg via ORAL
  Filled 2020-01-09 (×2): qty 1

## 2020-01-09 MED ORDER — HYDROCODONE-ACETAMINOPHEN 5-325 MG PO TABS
1.0000 | ORAL_TABLET | Freq: Four times a day (QID) | ORAL | 0 refills | Status: DC | PRN
Start: 2020-01-09 — End: 2021-06-06

## 2020-01-09 MED ORDER — TRAMADOL HCL 50 MG PO TABS
50.0000 mg | ORAL_TABLET | Freq: Four times a day (QID) | ORAL | 0 refills | Status: DC | PRN
Start: 2020-01-09 — End: 2021-06-06

## 2020-01-09 NOTE — Progress Notes (Signed)
Physical Therapy Treatment Patient Details Name: Isaiah Murphy MRN: [id number] DOB: 01-26-1935 Today's Date: 01/09/2020    History of Present Illness Pt is 85 yo male admitted for L anterior THA on 01/07/20.  He has pmh including but not limited to R THA, pacemaker, HTN, anemia.    PT Comments    POD # 2 pm session Assisted with all (supine/seated/standing) THR TE's following HEP handout.  Instructed on proper tech and freq.  Assisted with amb to and from bathroom.  General Gait Details: slowly imroving his ability to self advance L LE requires increased time.  Only amb to and from bathroom this session to focus on HEP education and lunch tray arrived.  Pt plans to D/C to home tomorrow.  Will have one more PT session in am.    Follow Up Recommendations  Follow surgeons recommendation for DC plan and follow-up therapies;Supervision/Assistance - 24 hour     Equipment Recommendations  None recommended by PT    Recommendations for Other Services       Precautions / Restrictions Precautions Precautions: Fall Restrictions Weight Bearing Restrictions: No LLE Weight Bearing: Weight bearing as tolerated    Mobility  Bed Mobility Overal bed mobility: Needs Assistance Bed Mobility: Supine to Sit     Supine to sit: Min assist;Supervision (requires extra extra time to get EOB with use of strap and HOB elevated)     General bed mobility comments: OOB in recliner  Transfers Overall transfer level: Needs assistance Equipment used: Rolling walker (2 wheeled) Transfers: Sit to/from Stand Sit to Stand: Min assist         General transfer comment: VCs for proper hand placement for lift off, to get his L foot underneath him, and to lean forward  Ambulation/Gait Ambulation/Gait assistance: Min assist Gait Distance (Feet): 12 Feet (to and from bathroom) Assistive device: Rolling walker (2 wheeled) Gait Pattern/deviations: Step-to pattern;Decreased stride length;Decreased weight  shift to left;Shuffle Gait velocity: decreased   General Gait Details: slowly imroving his ability to self advance L LE requires increased time.  Only amb to and from bathroom this session to focus on HEP education.   Stairs             Wheelchair Mobility    Modified Rankin (Stroke Patients Only)       Balance                                            Cognition Arousal/Alertness: Awake/alert Behavior During Therapy: WFL for tasks assessed/performed Overall Cognitive Status: Within Functional Limits for tasks assessed                                 General Comments: AxO x 3 very educated      Exercises   Total Hip Replacement TE's following HEP Handout  supine 10 reps ankle pumps 05 reps knee presses 05 reps heel slides 05 reps SAQ's 05 reps ABD Seated 05 LAQ's Standing with walker 05 hip flex (AAROM) 05 reps hip ABd 05 reps hip ext (only a step length back) 05 reps back kicks (knee flex)  Instructed how to use a belt loop to assist  Instructed walking was best exercise  Followed by ICE Instructed on use and freq     General Comments  Pertinent Vitals/Pain Pain Assessment: 0-10 Pain Score: 5  Pain Location: L hip pain during ambulation Pain Descriptors / Indicators: Sore;Tender;Discomfort Pain Intervention(s): Monitored during session;Premedicated before session;Repositioned;Ice applied    Home Living                      Prior Function            PT Goals (current goals can now be found in the care plan section) Acute Rehab PT Goals Patient Stated Goal: return home PT Goal Formulation: With patient/family Time For Goal Achievement: 01/21/20 Potential to Achieve Goals: Good Progress towards PT goals: Progressing toward goals    Frequency    7X/week      PT Plan Current plan remains appropriate    Co-evaluation              AM-PAC PT "6 Clicks" Mobility   Outcome  Measure  Help needed turning from your back to your side while in a flat bed without using bedrails?: A Little Help needed moving from lying on your back to sitting on the side of a flat bed without using bedrails?: A Little Help needed moving to and from a bed to a chair (including a wheelchair)?: A Little Help needed standing up from a chair using your arms (e.g., wheelchair or bedside chair)?: A Little Help needed to walk in hospital room?: A Little Help needed climbing 3-5 steps with a railing? : A Lot 6 Click Score: 17    End of Session Equipment Utilized During Treatment: Gait belt Activity Tolerance: Patient tolerated treatment well Patient left: in chair;with chair alarm set;with call bell/phone within reach Nurse Communication: Mobility status PT Visit Diagnosis: Other abnormalities of gait and mobility (R26.89);Muscle weakness (generalized) (M62.81);Pain Pain - Right/Left: Left Pain - part of body: Hip     Time: 1420-1449 PT Time Calculation (min) (ACUTE ONLY): 29 min  Charges:  $Gait Training: 8-22 mins $Therapeutic Exercise: 8-22 mins                     {Jamie Belger  PTA Acute  Rehabilitation Services Pager      [phone] Office      [phone]

## 2020-01-09 NOTE — Progress Notes (Signed)
   Subjective: 2 Days Post-Op Procedure(s) (LRB): TOTAL HIP ARTHROPLASTY ANTERIOR APPROACH (Left) Patient reports pain as moderate.   Patient seen in rounds for Dr. Lequita Halt. Patient is well, and has had no acute complaints or problems other than soreness in the left hip. Denies chest pain or SOB. No issues overnight. Voiding without difficulty. Plan is to go Home after hospital stay.  Objective: Vital signs in last 24 hours: Temp:  [98.1 F (36.7 C)-98.4 F (36.9 C)] 98.2 F (36.8 C) (12/03 0520) Pulse Rate:  [69-70] 70 (12/03 0520) Resp:  [16-18] 16 (12/03 0520) BP: (102-127)/(65-73) 120/71 (12/03 0520) SpO2:  [96 %-99 %] 97 % (12/03 0520)  Intake/Output from previous day:  Intake/Output Summary (Last 24 hours) at 01/09/2020 0716 Last data filed at 01/09/2020 0520 Gross per 24 hour  Intake 460 ml  Output 1000 ml  Net -540 ml    Intake/Output this shift: No intake/output data recorded.  Labs: Recent Labs    01/08/20 0327 01/09/20 0318  HGB 12.3* 12.0*   Recent Labs    01/08/20 0327 01/09/20 0318  WBC 15.7* 16.8*  RBC 3.63* 3.66*  HCT 35.4* 35.2*  PLT 156 155   Recent Labs    01/08/20 0327 01/09/20 0318  NA 131* 130*  K 3.9 3.9  CL 99 97*  CO2 24 26  BUN 19 19  CREATININE 1.12 1.07  GLUCOSE 148* 133*  CALCIUM 8.0* 8.1*   No results for input(s): LABPT, INR in the last 72 hours.  Exam: General - Patient is Alert and Oriented Extremity - Neurologically intact Neurovascular intact Sensation intact distally Dorsiflexion/Plantar flexion intact Dressing/Incision - clean, dry, no drainage Motor Function - intact, moving foot and toes well on exam.   Past Medical History:  Diagnosis Date  . Acute blood loss anemia   . Allergic rhinitis   . Arthritis   . Constipation   . Dyslipidemia   . Dysrhythmia    history of paroxysmal A Fib and typical A Flutter   . GERD (gastroesophageal reflux disease)   . History of bronchitis   . History of sick sinus  syndrome   . History of syncope   . HLD (hyperlipidemia)   . Hypertension   . Hyponatremia   . Leukocytosis   . Moderate persistent asthma with acute exacerbation in adult   . Presence of permanent cardiac pacemaker   . Primary osteoarthritis of right hip   . Sleep apnea    pt states not currently using CPAP machine   . Unsteady gait     Assessment/Plan: 2 Days Post-Op Procedure(s) (LRB): TOTAL HIP ARTHROPLASTY ANTERIOR APPROACH (Left) Active Problems:   Primary osteoarthritis of left hip   Status post total hip replacement, left  Estimated body mass index is 28.32 kg/m as calculated from the following:   Height as of this encounter: 5\' 9"  (1.753 m).   Weight as of this encounter: 87 kg. Up with therapy  DVT Prophylaxis - Eliquis Weight-bearing as tolerated  Patient states he will not have adequate help at home until tomorrow. HHPT and an aide have been arranged through social work. Will continue working with PT today and plan for discharge tomorrow around lunch. Follow-up in the office on December 14th.  The PDMP database was reviewed today prior to any opioid medications being prescribed to this patient.   December 16, PA-C Orthopedic Surgery [phone] 01/09/2020, 7:16 AM

## 2020-01-09 NOTE — Plan of Care (Signed)
Plan of care reviewed and discussed with the patient. 

## 2020-01-09 NOTE — Progress Notes (Signed)
Physical Therapy Treatment Patient Details Name: Isaiah Murphy MRN: [id number] DOB: 01/31/1935 Today's Date: 01/09/2020    History of Present Illness Pt is 85 yo male admitted for L anterior THA on 01/07/20.  He has pmh including but not limited to R THA, pacemaker, HTN, anemia.    PT Comments    Pt received awake in bed for AM session. He claims his pain is a "negative 1 out of 10" with lying in bed. To get EOB, he requires HOB to be elevated, increased use of bed rails, strap, and increased time. However, he insists on doing it independently. Pt is asked if he has adjustable bed or bed rails at home, and he replies "no, but it's okay". He is Min A to stand, requiring VCs for proper foot and hand placement for lift off, to lean forward when initiating, and to stand upright once standing. He needs additional time to stand at EOB before walking. No s/s dizziness.  Min A for ambulation in hallway 40 ft. He needs light assistance to advance L LE at first, however, quickly becomes independent. Takes 2 standing rest breaks to talk to patients in hallway. Does not require to utilize chair follow; pt is determined to complete walk today. Min A to sit with VCs to reach back to chair when sitting. Good balance and control to sit.   Follow Up Recommendations  Follow surgeons recommendation for DC plan and follow-up therapies;Supervision/Assistance - 24 hour     Equipment Recommendations  None recommended by PT    Recommendations for Other Services       Precautions / Restrictions Precautions Precautions: Fall Restrictions Weight Bearing Restrictions: No LLE Weight Bearing: Weight bearing as tolerated    Mobility  Bed Mobility Overal bed mobility: Needs Assistance Bed Mobility: Supine to Sit     Supine to sit: Min assist;Supervision (requires extra extra time to get EOB with use of strap and HOB elevated)     General bed mobility comments: requires extra extra time to get EOB with use of  strap and HOB elevated. Pt requests HOB to be elevated even higher. Increased use of hand rail. However, required no physical assistance.  Transfers Overall transfer level: Needs assistance Equipment used: Rolling walker (2 wheeled) Transfers: Sit to/from Stand Sit to Stand: Min assist         General transfer comment: VCs for proper hand placement for lift off, to get his L foot underneath him, and to lean forward  Ambulation/Gait Ambulation/Gait assistance: Min assist Gait Distance (Feet): 40 Feet Assistive device: Rolling walker (2 wheeled) Gait Pattern/deviations: Step-to pattern;Decreased stride length;Decreased weight shift to left;Shuffle Gait velocity: decreased   General Gait Details: Slow moving due to decreased ability to advance L LE. Unable to advance L LE on his own initially, but became independent after ~10 step-throughs.   Stairs             Wheelchair Mobility    Modified Rankin (Stroke Patients Only)       Balance                                            Cognition Arousal/Alertness: Awake/alert Behavior During Therapy: WFL for tasks assessed/performed Overall Cognitive Status: Within Functional Limits for tasks assessed  General Comments: AxO x 3      Exercises      General Comments        Pertinent Vitals/Pain Pain Assessment: 0-10 Pain Score: 1  Pain Location: L hip pain during ambulation Pain Descriptors / Indicators: Sore Pain Intervention(s): Monitored during session;Ice applied    Home Living                      Prior Function            PT Goals (current goals can now be found in the care plan section) Acute Rehab PT Goals Patient Stated Goal: return home PT Goal Formulation: With patient/family Time For Goal Achievement: 01/21/20 Potential to Achieve Goals: Good Progress towards PT goals: Progressing toward goals    Frequency     7X/week      PT Plan Current plan remains appropriate    Co-evaluation              AM-PAC PT "6 Clicks" Mobility   Outcome Measure  Help needed turning from your back to your side while in a flat bed without using bedrails?: A Little Help needed moving from lying on your back to sitting on the side of a flat bed without using bedrails?: A Little Help needed moving to and from a bed to a chair (including a wheelchair)?: A Little Help needed standing up from a chair using your arms (e.g., wheelchair or bedside chair)?: A Little Help needed to walk in hospital room?: A Little Help needed climbing 3-5 steps with a railing? : A Lot 6 Click Score: 17    End of Session Equipment Utilized During Treatment: Gait belt Activity Tolerance: Patient tolerated treatment well Patient left: in chair;with chair alarm set;with call bell/phone within reach Nurse Communication: Mobility status PT Visit Diagnosis: Other abnormalities of gait and mobility (R26.89);Muscle weakness (generalized) (M62.81);Pain Pain - Right/Left: Left Pain - part of body: Hip     Time: 3662-9476 PT Time Calculation (min) (ACUTE ONLY): 28 min  Charges:  $Gait Training: 8-22 mins $Therapeutic Activity: 8-22 mins                     C. Alinda Dooms, SPTA [facility] [phone]

## 2020-01-09 NOTE — Progress Notes (Signed)
Pt refusing CPAP QHS at this time due to taking a laxative.  Pt states he will call if he decides to wear, RT to monitor and assess as needed.

## 2020-01-10 LAB — CBC
HCT: 35.4 % — ABNORMAL LOW (ref 39.0–52.0)
Hemoglobin: 12.3 g/dL — ABNORMAL LOW (ref 13.0–17.0)
MCH: 33.3 pg (ref 26.0–34.0)
MCHC: 34.7 g/dL (ref 30.0–36.0)
MCV: 95.9 fL (ref 80.0–100.0)
Platelets: 158 10*3/uL (ref 150–400)
RBC: 3.69 MIL/uL — ABNORMAL LOW (ref 4.22–5.81)
RDW: 12.6 % (ref 11.5–15.5)
WBC: 11.2 10*3/uL — ABNORMAL HIGH (ref 4.0–10.5)
nRBC: 0 % (ref 0.0–0.2)

## 2020-01-10 MED ORDER — MAGNESIUM CITRATE PO SOLN
1.0000 | Freq: Once | ORAL | Status: DC
  Filled 2020-01-10: qty 296

## 2020-01-10 NOTE — Progress Notes (Signed)
Subjective: 3 Days Post-Op Procedure(s) (LRB): TOTAL HIP ARTHROPLASTY ANTERIOR APPROACH (Left)  Patient reports pain as mild to moderate.  Denies fever, chills, N/V, CP, SOB.  Notes that he has worked well with therapy. Tolerating POs well.  Admits to flatus.  Denies BM.  Denies abdominal pain. Has concerns over not having a BM as he has struggled with constipation in the past.  His daughter is on speaker phone for our encounter this morning.    Objective:   VITALS:  Temp:  [97.6 F (36.4 C)-98.3 F (36.8 C)] 98.2 F (36.8 C) (12/04 0453) Pulse Rate:  [71-75] 71 (12/04 0453) Resp:  [17] 17 (12/04 0453) BP: (120-134)/(69-78) 126/69 (12/04 0453) SpO2:  [97 %-100 %] 100 % (12/04 0453)  General: WDWN patient in NAD. Psych:  Appropriate mood and affect. Neuro:  A&O x 3, Moving all extremities, sensation intact to light touch HEENT:  EOMs intact Chest:  Even non-labored respirations Skin: Dressing C/D/I, no rashes or lesions Extremities: warm/dry, mild edema to L thigh, no erythema or echymosis.  No lymphadenopathy. Pulses: Popliteus 2+ MSK:  ROM: TKE, MMT: able to perform quad set, (-) Homan's    LABS Recent Labs    01/08/20 0327 01/08/20 0327 01/09/20 0318 01/10/20 0415  HGB 12.3*  --  12.0* 12.3*  WBC 15.7*   < > 16.8* 11.2*  PLT 156   < > 155 158   < > = values in this interval not displayed.   Recent Labs    01/08/20 0327 01/09/20 0318  NA 131* 130*  K 3.9 3.9  CL 99 97*  CO2 24 26  BUN 19 19  CREATININE 1.12 1.07  GLUCOSE 148* 133*   No results for input(s): LABPT, INR in the last 72 hours.   Assessment/Plan: 3 Days Post-Op Procedure(s) (LRB): TOTAL HIP ARTHROPLASTY ANTERIOR APPROACH (Left)  Patient seen in rounds for Dr. Lequita Halt Mag Citrate ordered due to patient's lack of BM and hx of constipation. WBAT L LE Up with therapy DVT ppx: Eliquis D/C home today with HHPT D/C scripts sent to patient's pharmacy. Plan for outpatient post-op visit on  01/20/20.   Alfredo Martinez PA-C [facility] Office:  [phone]

## 2020-01-10 NOTE — Discharge Summary (Signed)
Physician Discharge Summary  Patient ID: Isaiah Murphy MRN: [id number] DOB/AGE: 1935-01-24 85 y.o.  Admit date: 01/07/2020 Discharge date: 01/10/2020  Admission Diagnoses: L hip OA; hx of Atrial flutter, near syncope, HTN, dyslipidemia, asthma, tachycardia-bradycardia syndrome, pacemaker implanted.  Discharge Diagnoses:  Active Problems:   Primary osteoarthritis of left hip   Status post total hip replacement, left Same as above.  Discharged Condition: stable  Hospital Course: Patient presented to [facility] OR on 01/07/20 for elective L total hip arthroplasty by Dr. Ollen Gross.  The patient tolerated the procedure well and was admitted to the hospital.  He worked well with therapy.  He tolerated his stay well without complication.  He is to be D/C'd home on 01/10/20 with HHPT.  Consults: PT/OT  Significant Diagnostic Studies: radiology: X-Ray: for diagnostic purposes  Treatments: IV hydration, antibiotics: Ancef, analgesia: acetaminophen, Morphine, tramadol, and hydrocodone, cardiac meds: irbesartan/chlorthalidone, anticoagulation: eliquis and surgery: as stated above.  Discharge Exam: Blood pressure 126/69, pulse 71, temperature 98.2 F (36.8 C), temperature source Oral, resp. rate 17, height 5\' 9"  (1.753 m), weight 87 kg, SpO2 100 %. General: WDWN patient in NAD. Psych:  Appropriate mood and affect. Neuro:  A&O x 3, Moving all extremities, sensation intact to light touch HEENT:  EOMs intact Chest:  Even non-labored respirations Skin:  Dressing C/D/I, no rashes or lesions Extremities: warm/dry, mild edema to L thigh, no erythema or echymosis.  No lymphadenopathy. Pulses: Popliteus 2+ MSK:  ROM: TKE, MMT: able to perform quad set, (-) Homan's   Disposition: Discharge disposition: 01-Home or Self Care       Discharge Instructions    Call MD / Call 911   Complete by: As directed    If you experience chest pain or shortness of breath, CALL 911 and be transported to the  hospital emergency room.  If you develope a fever above 101 F, pus (white drainage) or increased drainage or redness at the wound, or calf pain, call your surgeon's office.   Constipation Prevention   Complete by: As directed    Drink plenty of fluids.  Prune juice may be helpful.  You may use a stool softener, such as Colace (over the counter) 100 mg twice a day.  Use MiraLax (over the counter) for constipation as needed.   Diet - low sodium heart healthy   Complete by: As directed    Increase activity slowly as tolerated   Complete by: As directed    Weight bearing as tolerated   Complete by: As directed    Laterality: left   Extremity: Lower     Allergies as of 01/10/2020      Reactions   Penicillins Rash   Has patient had a PCN reaction causing immediate rash, facial/tongue/throat swelling, SOB or lightheadedness with hypotension: Yes Has patient had a PCN reaction causing severe rash involving mucus membranes or skin necrosis: Yes Has patient had a PCN reaction that required hospitalization: No Has patient had a PCN reaction occurring within the last 10 years: No If all of the above answers are "NO", then may proceed with Cephalosporin use. Rash at the injection site      Medication List    TAKE these medications   albuterol 108 (90 Base) MCG/ACT inhaler Commonly known as: VENTOLIN HFA Inhale 2 puffs into the lungs every 6 (six) hours as needed for wheezing or shortness of breath.   apixaban 5 MG Tabs tablet Commonly known as: ELIQUIS Take 1 tablet (5 mg total)  by mouth 2 (two) times daily.   atorvastatin 20 MG tablet Commonly known as: LIPITOR Take 20 mg by mouth at bedtime.   ciclesonide 160 MCG/ACT inhaler Commonly known as: ALVESCO Use one puff twice daily to prevent cough or wheeze.  Increase to 2 puffs twice daily with asthma flare. Rinse, gargle and spit after use What changed:   how much to take  how to take this  when to take this  additional  instructions   Edarbyclor 40-12.5 MG Tabs Generic drug: Azilsartan-Chlorthalidone Take 1 tablet by mouth daily.   esomeprazole 40 MG capsule Commonly known as: NEXIUM Take 40 mg by mouth daily as needed (reflux).   fluticasone 50 MCG/ACT nasal spray Commonly known as: FLONASE Place 2 sprays into both nostrils daily.   HYDROcodone-acetaminophen 5-325 MG tablet Commonly known as: NORCO/VICODIN Take 1-2 tablets by mouth every 6 (six) hours as needed for severe pain.   Mega Multi Men Tabs Take 1 tablet by mouth once a week.   methocarbamol 500 MG tablet Commonly known as: ROBAXIN Take 1 tablet (500 mg total) by mouth every 6 (six) hours as needed for muscle spasms.   montelukast 10 MG tablet Commonly known as: SINGULAIR Take 10 mg by mouth daily as needed (allergies/ itching eye).   olopatadine 0.1 % ophthalmic solution Commonly known as: PATANOL Place 1 drop into both eyes daily.   polyethylene glycol 17 g packet Commonly known as: MIRALAX / GLYCOLAX Take 17 g by mouth daily as needed for mild constipation.   traMADol 50 MG tablet Commonly known as: ULTRAM Take 1-2 tablets (50-100 mg total) by mouth every 6 (six) hours as needed for moderate pain.   triamcinolone 0.1 % Commonly known as: KENALOG Apply 1 application topically 2 (two) times daily as needed (itching).            Durable Medical Equipment  (From admission, onward)         Start     Ordered   01/08/20 1137  For home use only DME 3 n 1  Once        01/08/20 1136           Discharge Care Instructions  (From admission, onward)         Start     Ordered   01/10/20 0000  Weight bearing as tolerated       Question Answer Comment  Laterality left   Extremity Lower      01/10/20 0848          Follow-up Information    Ollen Gross, MD. Schedule an appointment as soon as possible for a visit on 01/20/2020.   Specialty: Orthopedic Surgery Contact information: [address] [place] [address] [phone]               Signed: Lolly Mustache Office:  [phone]

## 2020-01-10 NOTE — Progress Notes (Signed)
Physical Therapy Treatment Patient Details Name: Isaiah Murphy MRN: [id number] DOB: 1934/09/06 Today's Date: 01/10/2020    History of Present Illness Pt is 85 yo male admitted for L anterior THA on 01/07/20.  He has pmh including but not limited to R THA, pacemaker, HTN, anemia.    PT Comments    POD # 3 General bed mobility comments: increased time but self able using belt loop.  General transfer comment: increased effort from lower bed height(pt plans to sleep on a day bed) General Gait Details: slowly improving does require increased time initially.  Slow.  Able to amb a functional distance. Then returned to room to perform some TE's following HEP handout.  Instructed on proper tech, freq as well as use of ICE.   Addressed all mobility questions, discussed appropriate activity, educated on use of ICE.  Pt ready for D/C to home.   Follow Up Recommendations  Follow surgeon's recommendation for DC plan and follow-up therapies;Supervision/Assistance - 24 hour     Equipment Recommendations  None recommended by PT    Recommendations for Other Services       Precautions / Restrictions Precautions Precautions: Fall Restrictions Weight Bearing Restrictions: No LLE Weight Bearing: Weight bearing as tolerated    Mobility  Bed Mobility Overal bed mobility: Needs Assistance Bed Mobility: Supine to Sit     Supine to sit: Supervision     General bed mobility comments: increased time but self able using belt loop  Transfers Overall transfer level: Needs assistance Equipment used: Rolling walker (2 wheeled) Transfers: Sit to/from Stand Sit to Stand: Supervision         General transfer comment: increased effort from lower bed height(pt plans to sleep on a day bed)  Ambulation/Gait Ambulation/Gait assistance: Supervision;Min guard Gait Distance (Feet): 55 Feet Assistive device: Rolling walker (2 wheeled) Gait Pattern/deviations: Step-to pattern;Decreased stride  length;Decreased weight shift to left;Shuffle Gait velocity: decreased   General Gait Details: slowly improving does require increased time initially.  Slow.  Able to amb a functional distance.   Stairs             Wheelchair Mobility    Modified Rankin (Stroke Patients Only)       Balance                                            Cognition Arousal/Alertness: Awake/alert Behavior During Therapy: WFL for tasks assessed/performed Overall Cognitive Status: Within Functional Limits for tasks assessed                                 General Comments: AxO x 3 very educated      Exercises  performed 3 reps all TE's following HEP Pt will have HHPT as well    General Comments        Pertinent Vitals/Pain Pain Assessment: 0-10 Pain Score: 5  Pain Location: L hip pain during ambulation Pain Descriptors / Indicators: Sore;Tender;Discomfort Pain Intervention(s): Monitored during session;Premedicated before session;Repositioned;Ice applied    Home Living                      Prior Function            PT Goals (current goals can now be found in the care plan section) Progress towards PT goals:  Progressing toward goals    Frequency    7X/week      PT Plan Current plan remains appropriate    Co-evaluation              AM-PAC PT "6 Clicks" Mobility   Outcome Measure  Help needed turning from your back to your side while in a flat bed without using bedrails?: None Help needed moving from lying on your back to sitting on the side of a flat bed without using bedrails?: None Help needed moving to and from a bed to a chair (including a wheelchair)?: None Help needed standing up from a chair using your arms (e.g., wheelchair or bedside chair)?: None Help needed to walk in hospital room?: A Little Help needed climbing 3-5 steps with a railing? : A Little 6 Click Score: 22    End of Session Equipment Utilized  During Treatment: Gait belt Activity Tolerance: Patient tolerated treatment well Patient left: in chair;with chair alarm set;with call bell/phone within reach Nurse Communication: Mobility status (ready for DC to home) PT Visit Diagnosis: Other abnormalities of gait and mobility (R26.89);Muscle weakness (generalized) (M62.81);Pain Pain - Right/Left: Left Pain - part of body: Hip     Time: 1205-1233 PT Time Calculation (min) (ACUTE ONLY): 28 min  Charges:  $Gait Training: 8-22 mins $Therapeutic Exercise: 8-22 mins                     Felecia Shelling  PTA Acute  Rehabilitation Services Pager      [phone] Office      [phone]

## 2020-01-16 DIAGNOSIS — Z96642 Presence of left artificial hip joint: Secondary | ICD-10-CM | POA: Insufficient documentation

## 2020-01-19 ENCOUNTER — Encounter (HOSPITAL_COMMUNITY): Payer: Self-pay | Admitting: Orthopedic Surgery

## 2020-01-20 ENCOUNTER — Encounter: Payer: Medicare Other | Admitting: Internal Medicine

## 2020-03-26 ENCOUNTER — Ambulatory Visit (INDEPENDENT_AMBULATORY_CARE_PROVIDER_SITE_OTHER): Payer: Medicare Other | Admitting: Internal Medicine

## 2020-03-26 ENCOUNTER — Other Ambulatory Visit: Payer: Self-pay

## 2020-03-26 ENCOUNTER — Encounter: Payer: Self-pay | Admitting: Internal Medicine

## 2020-03-26 VITALS — BP 146/84 | HR 72 | Ht 69.5 in | Wt 190.0 lb

## 2020-03-26 DIAGNOSIS — Z950 Presence of cardiac pacemaker: Secondary | ICD-10-CM

## 2020-03-26 DIAGNOSIS — I10 Essential (primary) hypertension: Secondary | ICD-10-CM

## 2020-03-26 DIAGNOSIS — I495 Sick sinus syndrome: Secondary | ICD-10-CM

## 2020-03-26 LAB — CUP PACEART INCLINIC DEVICE CHECK
Battery Remaining Longevity: 28 mo
Battery Voltage: 2.94 V
Brady Statistic AP VP Percent: 96.15 %
Brady Statistic AP VS Percent: 0 %
Brady Statistic AS VP Percent: 3.84 %
Brady Statistic AS VS Percent: 0 %
Brady Statistic RA Percent Paced: 95.38 %
Brady Statistic RV Percent Paced: 99.94 %
Date Time Interrogation Session: 20220218152800
Implantable Lead Implant Date: 20150413
Implantable Lead Implant Date: 20150413
Implantable Lead Location: 753859
Implantable Lead Location: 753860
Implantable Lead Model: 5076
Implantable Lead Model: 5076
Implantable Pulse Generator Implant Date: 20150413
Lead Channel Impedance Value: 361 Ohm
Lead Channel Impedance Value: 437 Ohm
Lead Channel Impedance Value: 494 Ohm
Lead Channel Impedance Value: 570 Ohm
Lead Channel Pacing Threshold Amplitude: 0.5 V
Lead Channel Pacing Threshold Amplitude: 0.75 V
Lead Channel Pacing Threshold Pulse Width: 0.4 ms
Lead Channel Pacing Threshold Pulse Width: 0.4 ms
Lead Channel Sensing Intrinsic Amplitude: 1.125 mV
Lead Channel Sensing Intrinsic Amplitude: 14.75 mV
Lead Channel Setting Pacing Amplitude: 1.5 V
Lead Channel Setting Pacing Amplitude: 2.5 V
Lead Channel Setting Pacing Pulse Width: 0.4 ms
Lead Channel Setting Sensing Sensitivity: 0.9 mV

## 2020-03-26 NOTE — Progress Notes (Signed)
HPI Mr. Fredericksen returns today for ongoing evaluation and management of his PPM. He is a pleasant 86 yo man with a h/o HTN, sinus node dysfunction and CHB, who is s/p PPM insertion. He has moved back to [place] from [place]. He has PAF. he does not have palpitations. Allergies  Allergen Reactions  . Penicillins Rash    Has patient had a PCN reaction causing immediate rash, facial/tongue/throat swelling, SOB or lightheadedness with hypotension: Yes Has patient had a PCN reaction causing severe rash involving mucus membranes or skin necrosis: Yes Has patient had a PCN reaction that required hospitalization: No Has patient had a PCN reaction occurring within the last 10 years: No If all of the above answers are "NO", then may proceed with Cephalosporin use. Rash at the injection site     Current Outpatient Medications  Medication Sig Dispense Refill  . albuterol (VENTOLIN HFA) 108 (90 Base) MCG/ACT inhaler Inhale 2 puffs into the lungs every 6 (six) hours as needed for wheezing or shortness of breath.    Marland Kitchen apixaban (ELIQUIS) 5 MG TABS tablet Take 1 tablet (5 mg total) by mouth 2 (two) times daily. 180 tablet 3  . atorvastatin (LIPITOR) 20 MG tablet Take 20 mg by mouth at bedtime.     . Azilsartan-Chlorthalidone (EDARBYCLOR) 40-12.5 MG TABS Take 1 tablet by mouth daily. 90 tablet 3  . ciclesonide (ALVESCO) 160 MCG/ACT inhaler Use one puff twice daily to prevent cough or wheeze.  Increase to 2 puffs twice daily with asthma flare. Rinse, gargle and spit after use (Patient taking differently: Inhale 1 puff into the lungs daily. with asthma flare. Rinse, gargle and spit after use) 1 Inhaler 1  . esomeprazole (NEXIUM) 40 MG capsule Take 40 mg by mouth daily as needed (reflux).    . fluticasone (FLONASE) 50 MCG/ACT nasal spray Place 2 sprays into both nostrils daily.    Marland Kitchen HYDROcodone-acetaminophen (NORCO/VICODIN) 5-325 MG tablet Take 1-2 tablets by mouth every 6 (six) hours as needed for  severe pain. 42 tablet 0  . methocarbamol (ROBAXIN) 500 MG tablet Take 1 tablet (500 mg total) by mouth every 6 (six) hours as needed for muscle spasms. 40 tablet 0  . montelukast (SINGULAIR) 10 MG tablet Take 10 mg by mouth daily as needed (allergies/ itching eye).     . Multiple Vitamins-Minerals (MEGA MULTI MEN) TABS Take 1 tablet by mouth once a week.    Marland Kitchen olopatadine (PATANOL) 0.1 % ophthalmic solution Place 1 drop into both eyes daily.     . polyethylene glycol (MIRALAX / GLYCOLAX) packet Take 17 g by mouth daily as needed for mild constipation. 14 each 0  . traMADol (ULTRAM) 50 MG tablet Take 1-2 tablets (50-100 mg total) by mouth every 6 (six) hours as needed for moderate pain. 40 tablet 0  . triamcinolone (KENALOG) 0.1 % Apply 1 application topically 2 (two) times daily as needed (itching). 80 g 3   No current facility-administered medications for this visit.     Past Medical History:  Diagnosis Date  . Acute blood loss anemia   . Allergic rhinitis   . Arthritis   . Constipation   . Dyslipidemia   . Dysrhythmia    history of paroxysmal A Fib and typical A Flutter   . GERD (gastroesophageal reflux disease)   . History of bronchitis   . History of sick sinus syndrome   . History of syncope   . HLD (hyperlipidemia)   . Hypertension   .  Hyponatremia   . Leukocytosis   . Moderate persistent asthma with acute exacerbation in adult   . Presence of permanent cardiac pacemaker   . Primary osteoarthritis of right hip   . Sleep apnea    pt states not currently using CPAP machine   . Unsteady gait     ROS:   All systems reviewed and negative except as noted in the HPI.   Past Surgical History:  Procedure Laterality Date  . CARDIOVERSION N/A 04/28/2013   Procedure: CARDIOVERSION;  Surgeon: Lars Masson, MD;  [facility] ENDOSCOPY;  Service: Cardiovascular;  Laterality: N/A;  . HEMORRHOID SURGERY    . left shoulder surgery      secondary to torn ligament / subluxation   . TEE WITHOUT CARDIOVERSION N/A 04/28/2013   Procedure: TRANSESOPHAGEAL ECHOCARDIOGRAM (TEE);  Surgeon: Lars Masson, MD;  Location: [facility] ENDOSCOPY;  Service: Cardiovascular;  Laterality: N/A;  . TOTAL HIP ARTHROPLASTY Right 03/08/2015   Procedure: RIGHT TOTAL HIP ARTHROPLASTY ANTERIOR APPROACH;  Surgeon: Ollen Gross, MD;  Location: [facility] ORS;  Service: Orthopedics;  Laterality: Right;  . TOTAL HIP ARTHROPLASTY Left 01/07/2020   Procedure: TOTAL HIP ARTHROPLASTY ANTERIOR APPROACH;  Surgeon: Ollen Gross, MD;  Location: [facility] ORS;  Service: Orthopedics;  Laterality: Left;      Family History  Problem Relation Age of Onset  . Hypertension Father   . Allergic rhinitis Neg Hx   . Asthma Neg Hx   . Eczema Neg Hx   . Immunodeficiency Neg Hx   . Urticaria Neg Hx      Social History   Socioeconomic History  . Marital status: Married    Spouse name: Not on file  . Number of children: Not on file  . Years of education: Not on file  . Highest education level: Not on file  Occupational History  . Not on file  Tobacco Use  . Smoking status: Never Smoker  . Smokeless tobacco: Never Used  Vaping Use  . Vaping Use: Never used  Substance and Sexual Activity  . Alcohol use: Yes    Alcohol/week: 0.0 standard drinks    Comment: socially   . Drug use: No  . Sexual activity: Not on file  Other Topics Concern  . Not on file  Social History Narrative   ** Merged History Encounter **       Married, Scientist, research (medical) in [place]   Social Determinants of Corporate investment banker Strain: Not on [employer] Insecurity: Not on file  Transportation Needs: Not on file  Physical Activity: Not on file  Stress: Not on file  Social Connections: Not on file  Intimate Partner Violence: Not on file     BP (!) 146/84   Pulse 72   Ht 5' 9.5" (1.765 m)   Wt 190 lb (86.2 kg)   SpO2 97%   BMI 27.66 kg/m   Physical Exam:  Well appearing NAD HEENT:  Unremarkable Neck:  No JVD, no thyromegally Lymphatics:  No adenopathy Back:  No CVA tenderness Lungs:  Clear with no wheezes HEART:  Regular rate rhythm, no murmurs, no rubs, no clicks Abd:  soft, positive bowel sounds, no organomegally, no rebound, no guarding Ext:  2 plus pulses, no edema, no cyanosis, no clubbing Skin:  No rashes no nodules Neuro:  CN II through XII intact, motor grossly intact  EKG - NSR with AV pacing  DEVICE  Normal device function.  See PaceArt for details.  Assess/Plan:. 1. Tachybrady syndrome - he is maintaining NSR. He is asymptomatic. 2. CHB - he is asymptomatic. He has no escape today. 3. PPM - his medtronic DDD PM is working normally. We will recheck in several months. 4. HTN - his sbp is up a bit. No change in his meds today. He will maintain a low sodium diet.  Sharlot Gowda Hearl Heikes,MD

## 2020-03-26 NOTE — Patient Instructions (Signed)
Medication Instructions:  Your physician recommends that you continue on your current medications as directed. Please refer to the Current Medication list given to you today.  Labwork: None ordered.  Testing/Procedures: None ordered.  Follow-Up: Your physician wants you to follow-up in: one year with Lewayne Bunting, MD or one of the following Advanced Practice Providers on your designated Care Team:    Gypsy Balsam, NP  Francis Dowse, PA-C  Casimiro Needle "Mardelle Matte" [place], [place]  Remote monitoring is used to monitor your Pacemaker from home. This monitoring reduces the number of office visits required to check your device to one time per year. It allows [place] to keep an eye on the functioning of your device to ensure it is working properly. You are scheduled for a device check from home on 04/19/2020. You may send your transmission at any time that day. If you have a wireless device, the transmission will be sent automatically. After your physician reviews your transmission, you will receive a postcard with your next transmission date.  Any Other Special Instructions Will Be Listed Below (If Applicable).  If you need a refill on your cardiac medications before your next appointment, please call your pharmacy.

## 2020-03-31 ENCOUNTER — Telehealth: Payer: Self-pay

## 2020-03-31 ENCOUNTER — Ambulatory Visit (INDEPENDENT_AMBULATORY_CARE_PROVIDER_SITE_OTHER): Payer: Medicare Other

## 2020-03-31 DIAGNOSIS — I495 Sick sinus syndrome: Secondary | ICD-10-CM | POA: Diagnosis not present

## 2020-03-31 NOTE — Telephone Encounter (Signed)
DOD call in from [facility]) asking if OK to prescribe Medrol pack. Dr. Excell Seltzer spoke with him and told him it was OK. He was grateful for assistance.

## 2020-04-01 LAB — CUP PACEART REMOTE DEVICE CHECK
Battery Remaining Longevity: 24 mo
Battery Voltage: 2.94 V
Brady Statistic AP VP Percent: 97.36 %
Brady Statistic AP VS Percent: 0 %
Brady Statistic AS VP Percent: 2.63 %
Brady Statistic AS VS Percent: 0 %
Brady Statistic RA Percent Paced: 97.24 %
Brady Statistic RV Percent Paced: 99.99 %
Date Time Interrogation Session: 20220223085955
Implantable Lead Implant Date: 20150413
Implantable Lead Implant Date: 20150413
Implantable Lead Location: 753859
Implantable Lead Location: 753860
Implantable Lead Model: 5076
Implantable Lead Model: 5076
Implantable Pulse Generator Implant Date: 20150413
Lead Channel Impedance Value: 361 Ohm
Lead Channel Impedance Value: 418 Ohm
Lead Channel Impedance Value: 494 Ohm
Lead Channel Impedance Value: 570 Ohm
Lead Channel Pacing Threshold Amplitude: 0.5 V
Lead Channel Pacing Threshold Amplitude: 0.625 V
Lead Channel Pacing Threshold Pulse Width: 0.4 ms
Lead Channel Pacing Threshold Pulse Width: 0.4 ms
Lead Channel Sensing Intrinsic Amplitude: 0.875 mV
Lead Channel Sensing Intrinsic Amplitude: 0.875 mV
Lead Channel Sensing Intrinsic Amplitude: 14.75 mV
Lead Channel Sensing Intrinsic Amplitude: 14.75 mV
Lead Channel Setting Pacing Amplitude: 1.5 V
Lead Channel Setting Pacing Amplitude: 2.5 V
Lead Channel Setting Pacing Pulse Width: 0.4 ms
Lead Channel Setting Sensing Sensitivity: 0.9 mV

## 2020-04-08 NOTE — Progress Notes (Signed)
Remote pacemaker transmission.   

## 2020-05-05 ENCOUNTER — Other Ambulatory Visit: Payer: Self-pay | Admitting: Orthopedic Surgery

## 2020-05-05 ENCOUNTER — Other Ambulatory Visit (HOSPITAL_COMMUNITY): Payer: Self-pay | Admitting: Orthopedic Surgery

## 2020-05-05 DIAGNOSIS — M5459 Other low back pain: Secondary | ICD-10-CM

## 2020-05-10 ENCOUNTER — Other Ambulatory Visit (HOSPITAL_COMMUNITY): Payer: Self-pay | Admitting: Orthopedic Surgery

## 2020-05-10 DIAGNOSIS — M5459 Other low back pain: Secondary | ICD-10-CM

## 2020-05-10 NOTE — Progress Notes (Signed)
[facility] OFF[facility]  [address] [place], [place] [address]     [name]    Date of Visit:  10/12/2017  Date of Birth: 04-30-1934  Age: 86 yrs.   Medical Record Number: [id number]  __  CURRENT DIAGNOSES     1. Essential (primary) hypertension,  I10  2. Hypertensive heart disease without heart failure, I11.9  3. Mitral valve insufficiency nonrheumatic, I34.0  4. Aortic valve stenosis non-rheumatic, I35.0  5. Ventricular tachycardia, I47.2  6. Paroxysmal atrial fibrillation,  I48.0  7. Typical atrial flutter, I48.3  8. Sick sinus syndrome, I49.5  9. Syncope and collapse, R55  10. Presence of cardiac pacemaker, Z95.0  11. Device check cardiac pacemaker, Z45.010  __   ALLERGIES    Penicillins, Rash  __  MEDICATIONS      1. Eliquis 5 mg tablet, 1 po bid  2. esomeprazole magnesium DR 40 mg granules delayed release for susp, 1 po qd  3. hydrochlorothiazide 12.5 mg tablet, 1 po q am  4. Lipitor 20 mg Tablet, 1 po qd  5. lisinopril 40 mg tablet,  1 po qd  6. montelukast 10 mg tablet, 1 po qd  7. multivitamin capsule, megaman, prn  8. Nasonex 50 mcg/Actuation Spray, Non-Aerosol, nasal spray-1 spray bid  9. olopatadine 0.1 % eye drops, as directed  10. ProAir HFA 90 mcg/actuation  aerosol inhaler, 1 puff for wheezing q 4 hours  __  CHIEF COMPLAINT/REASON FOR VISIT  Followup of Aortic valve stenosis non-rheumatic, Followup  of Essential (primary) hypertension, Followup of Paroxysmal atrial fibrillation and Followup of Syncope and collapse  __  HISTORY OF PRESENT ILLNESS   [name] is an 86 year old patient of [name], M.D. 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 He has had issues with atrial flutter and pauses and had a pacemaker in April of 2015. He has had a hip replacement  in the past and needs the other one done; however, he wants to hold off until he retires which could be in the spring of 2020. He denies any falls or syncope. He denies any chest pain or  shortness of breath. He denies any palpitations. He is fully anticoagulated  with Eliquis. He denies any signs of bleeding.   __  PAST HISTORY     Past Medical Illnesses : syncope-2008, HTN, syncope 8/18;  Past Cardiac Illnesses: Atrial Flutter, -Cardioverted in [place] 2015,  Pauses of 5-6 seconds while in Aflutter in [place], Atrial fibrillation-Paroxysmal; Infectious Diseases: No previous history of significant infectious diseases.;  Surgical Procedures: 50 yrs ago-Shldr reduction post subluxation;1978-Hemorrhoidectomy; Trauma History:  50 yrs ago-shldr subluxation; NYHA Classification: I; Canadian Angina Classification : Class 0: Asymptomatic; Cardiology Procedures-Invasive: Pacemaker implant April 2015; Cardiology Procedures-Noninvasive : Treadmill Dual Isotope 2008, Echocardiogram March 2010, Echo 04/28/13, Stress Echocardiogram March 2015, Echocardiogram March 2017, Echocardiogram June 2018; Left Ventricular Ejection Fraction : LVEF of 60% documented via echocardiogram on 07/28/2016  ___  FAMILY HISTORY   Father -- Stroke    __  CARDIAC RISK FACTORS      Tobacco Abuse: has never used tobacco; Family History of Heart Disease: negative;  Hyperlipidemia: positive; Hypertension: positive;   Diabetes Mellitus: negative; Prior History of Heart Disease: negative;  Obesity: negative; Sedentary Life Style:negative; Age :positive; Menopausal:not applicable  __  SOCIAL HISTORY     Alcohol Use: beer, wine, mixed drinks and 1-2 glasses/wk; Smoking: Does not smoke; Never smoker ([id number]);  Diet: Regular diet without modifications and Caffeine  use-1-2 per day; Lifestyle: Married;  Exercise: Exercises regularly and 2-3x/wk:Tennis; Seat Belt Use: always;  Occupation: A Interior and spatial designer at [employer]; Sexual Activity: did not discuss sexual history;  Residence:  lives with wife; Place of Birth: [place];    __  PHYSICAL EXAMINATION    Vital Signs:   Blood Pressure:  158/96 Sitting, Left arm, regular cuff  164/98 Sitting, Right arm, regular  cuff    Weight:  190.60 lbs.  Height: 69.5"  BMI: 27.74    Pulse: 75/min.       Constitutional: He's a normally  developed man who was in no distress Skin: surgical scars well-healed Head : Normocephalic, normal hair pattern, no masses or tenderness Eyes: conjunctivae and lids unremarkable  ENT: No pallor or cyanosis Neck:  JVP normal, no carotid bruit, thyroid not enlarged, no thyromegaly Chest: clear to auscultation bilaterally, no tenderness to palpation, normal respiratory  excursion, no intercostal retraction Cardiac: Regular rhythm, No S3 or S4, S1 normal, S2 normal. Apical impulse not displaced, no murmurs, gallops or rubs  detected. Abdomen: Abdomen soft, bowel sounds normoactive, no masses, no hepatosplenomegaly, non-tender, no bruits  Peripheral Pulses: bilateral radial pulse(s) 2+ Extremities/Back : No deformities, clubbing, cyanosis, erythema or edema observed. There are no spinal abnormalities noted. Normal muscle strength and tone. Psychiatric : normal memory Neurological: No gross motor or sensory  deficits noted, affect appropriate, oriented to time, person and place.   __    Medications added today by the physician:       EKG:  Sinus rhythm. There were a few paced beats.     IMPRESSIONS:  1. Status post pacemaker  which is functioning normally. I will arrange for him to have it   checked.   2. Paroxysmal atrial flutter. He is not aware of any palpitations. He remains fully   anticoagulated.   3. Hypertension. Blood pressure was elevated. He was  running a little behind and his   blood pressures have been well controlled up until today. He is going to monitor this   and he will see his primary care physician.   4. Dyslipidemia.      RECOMMENDATIONS:  The patient will follow up with [name], M.D. in six months. We will arrange for a pacemaker followup. He also has a history of sleep apnea. He stopped wearing his CPAP. He  has been noting increased fatigue. We will arrange for him to see one  of our sleep specialists.     Arville Care, ANP    LS/tuarg  ____________________________  TODAYS ORDERS   12 Lead ECG Today  Dual Cham. PPM w/o Reprogram First Available  Return Visit 15 MIN dr AP 6 months

## 2020-05-10 NOTE — Progress Notes (Signed)
[name], [name]    Date of Visit:  04/13/2017  Date of Birth: Dec 01, 1934  Age: 86 yrs.   Medical Record Number: [id number]  __  CURRENT DIAGNOSES     1. Essential (primary) hypertension,  I10  2. Hypertensive heart disease without heart failure, I11.9  3. Mitral valve insufficiency nonrheumatic, I34.0  4. Aortic valve stenosis non-rheumatic, I35.0  5. Ventricular tachycardia, I47.2  6. Paroxysmal atrial fibrillation,  I48.0  7. Typical atrial flutter, I48.3  8. Sick sinus syndrome, I49.5  9. Syncope and collapse, R55  10. Presence of cardiac pacemaker, Z95.0  11. Device check cardiac pacemaker, Z45.010  __   ALLERGIES    Penicillins, Rash  __  MEDICATIONS      1. ciclesonide HFA 160 mcg/actuation aerosol inhaler, take as directed  2. Eliquis 5 mg tablet, 1 po bid  3. esomeprazole magnesium DR 40 mg granules delayed release for susp, 1 po qd  4. hydrochlorothiazide 25 mg tablet, 1 po  q am  5. Lipitor 20 mg Tablet, 1 po qd  6. lisinopril 40 mg tablet, 1 po qd  7. montelukast 10 mg tablet, 1 po qd  8. Nasonex 50 mcg/Actuation Spray, Non-Aerosol, nasal spray-1 spray bid  9. olopatadine 0.1 % eye drops, as directed   10. Patanol 0.1 % eye drops, 1 drop both eyes qd  11. ProAir HFA 90 mcg/actuation aerosol inhaler, 1 puff for wheezing q 4 hours  12. Vitamin D2 50,000 unit capsule, 1po qweekly  13. 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 He underwent a pacemaker implant in April of 2015. He has had a hip replacement in the past. He was last seen in our office  in December of 2017 and since then has been doing fairly well. He now sees Dr. Carleene Cooper, and the patients blood pressure control has  been optimized. The amlodipine was discontinued. He continues on anticoagulation with Eliquis for his atrial dysrhythmias.  He tries to stay reasonably active. 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   __   PAST HISTORY     Past Medical Illnesses: syncope-2008, HTN, syncope 8/18;   Past Cardiac Illnesses: Atrial Flutter, -Cardioverted in [place] 2015, Pauses of 5-6 seconds while in Aflutter in [place], Atrial fibrillation-Paroxysmal;  Infectious Diseases: No previous history of significant infectious diseases.; Surgical Procedures: 50 yrs  ago-Shldr reduction post subluxation;1978-Hemorrhoidectomy; Trauma History: 50 yrs ago-shldr subluxation;  NYHA Classification: I; Canadian Angina Classification: Class 0: Asymptomatic;  Cardiology Procedures-Invasive: Pacemaker implant April 2015; Cardiology Procedures-Noninvasive: Treadmill  Dual Isotope 2008, Echocardiogram March 2010, Echo 04/28/13, Stress Echocardiogram March 2015, Echocardiogram March 2017, Echocardiogram June 2018; Left Ventricular Ejection Fraction : LVEF of 60% documented via echocardiogram on 07/28/2016  ___  FAMILY HISTORY   Father -- Stroke    __  CARDIAC RISK FACTORS      Tobacco Abuse: has never used tobacco; Family History of Heart Disease: negative;  Hyperlipidemia: positive; Hypertension: positive;   Diabetes Mellitus: negative; Prior History of Heart Disease: negative;  Obesity: negative; Sedentary Life Style:negative;  Age :positive; Menopausal:not applicable  __  SOCIAL HISTORY     Alcohol Use: beer, wine, mixed drinks and 1-2 glasses/wk; Smoking: Does not smoke; Never smoker ([id number]);  Diet: Regular diet without modifications and Caffeine use-1-2 per day; Lifestyle: Married;  Exercise: Exercises regularly and 2-3x/wk:Tennis; Seat Belt Use: always;  Occupation: A Interior and spatial designer at [employer]; Sexual Activity: did not discuss sexual history;   Residence:  lives with wife; Place of Birth: [place];    __  PHYSICAL EXAMINATION    Vital Signs:   Blood Pressure:  120/80 Sitting, Right arm, regular cuff    Weight: 189.00 lbs.   Height: 69.50"  BMI: 27   Pulse:  83/min.       Constitutional: He's a normally developed man who was in no distress  Skin: surgical scars well-healed Head: Normocephalic,  normal hair pattern, no masses or tenderness Eyes: conjunctivae and lids unremarkable ENT : No pallor or cyanosis Neck: JVP normal, no carotid  bruit, thyroid not enlarged, no thyromegaly Chest: clear to auscultation bilaterally, no tenderness to palpation, normal respiratory excursion, no intercostal  retraction Cardiac: Regular rhythm, No S3 or S4, S1 normal, S2 normal. Apical impulse not displaced, no murmurs, gallops or rubs detected.  Abdomen: Abdomen soft, bowel sounds normoactive, no masses, no hepatosplenomegaly, non-tender, no bruits Peripheral Pulses : bilateral radial pulse(s) 2+ Extremities/Back: No deformities, clubbing, cyanosis, erythema or edema  observed. There are no spinal abnormalities noted. Normal muscle strength and tone. Psychiatric: normal  memory Neurological: No gross motor or sensory deficits noted, affect appropriate, oriented to time,  person and place.   __    Medications added today by the physician:      ECG :  Normal sinus rhythm.     IMPRESSIONS/RECOMMENDATIONS:   1. Hypertension - the patients blood pressure remains under satisfactory control. He checks it   periodically and in fact has done well without the amlodipine.  2. Paroxysmal atrial flutter - the patient has not had his pacer checked in six months.  He does   have a home monitoring device, but does not use it and thus, he does need to come in to the   office a bit more frequently for [place] to interrogate it.   3. Long-term anticoagulation - as per national guidelines, he should remain on  lifelong   anticoagulation unless he develops complications related to that approach.    4. Dyslipidemia - on appropriate medical therapy and no changes advised.     Thank you again for allowing [place] to participate in this patients care.      Casper Harrison, M.D., F.A.C.C.    ARP/tubks    cc: Carleene Cooper MD     AP  ____________________________   TODAYS ORDERS  Diet mgmt edu, guidance and counseling TODAY  Return Visit with NP/PA 6 months  12 Lead ECG Today

## 2020-05-10 NOTE — Progress Notes (Signed)
[facility]   [address] [place], [place] [address]     [name]    Date of Visit:  04/15/2018  Date of Birth: 01/09/1935  Age: 86 yrs.   Medical Record Number: [id number]  __  CURRENT DIAGNOSES     1. Obstructive Sleep Apnea (adult)  (pediatric), G47.33  2. Essential (primary) hypertension, I10  3. Hypertensive heart disease without heart failure, I11.9  4. Mitral valve insufficiency nonrheumatic, I34.0  5. Aortic valve stenosis non-rheumatic, I35.0  6. Ventricular  tachycardia, I47.2  7. Paroxysmal atrial fibrillation, I48.0  8. Typical atrial flutter, I48.3  9. Sick sinus syndrome, I49.5  10. Syncope and collapse, R55  11. Presence of cardiac pacemaker, Z95.0  12. Device check cardiac pacemaker,  Z45.010  __  ALLERGIES    Penicillins, Rash   __  MEDICATIONS     1. Lipitor 20 mg Tablet, 1 po qd  2. Nasonex 50 mcg/Actuation Spray, Non-Aerosol, nasal spray-1 spray bid  3. montelukast  10 mg tablet, 1 po qd  4. ProAir HFA 90 mcg/actuation aerosol inhaler, 1 puff for wheezing q 4 hours  5. esomeprazole magnesium DR 40 mg granules delayed release for susp, 1 po qd  6. olopatadine 0.1 % eye drops, as directed  7. Eliquis  5 mg tablet, 1 po bid  8. multivitamin capsule, megaman, prn  9. Edarbyclor 40 mg-12.5 mg tablet, 1 po q am  __  CHIEF COMPLAINT/REASON FOR VISIT   Followup of Aortic valve stenosis non-rheumatic  __  HISTORY OF PRESENT ILLNESS    The patient is an 86 year old gentleman with a history of  atrial flutter and pauses who had a pacemaker placed in April 2015. He also has had a hip replacement and is anticipating another 1. 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  He has hand picked his successor. He continues to do well with anticoagulation and has had no bleeding tendencies with Eliquis. His hypertension regimen has been changed and he is now on edarbychlor and is tolerating that without problems. He  maintains  a reasonable level of physical activity. He has had no chest pain pressure heaviness or tightness. He denies any shortness of breath palpitations lightheadedness or syncope. Overall, he seems to be doing reasonably well.    __   PAST HISTORY     Past Medical Illnesses: syncope-2008, HTN, syncope 8/18;   Past Cardiac Illnesses: Atrial Flutter, -Cardioverted in [place] 2015, Pauses of 5-6 seconds while in Aflutter in [place], Atrial fibrillation-Paroxysmal;  Infectious Diseases: No previous history of significant infectious diseases.; Surgical Procedures: 50 yrs  ago-Shldr reduction post subluxation;1978-Hemorrhoidectomy; Trauma History: 50 yrs ago-shldr subluxation;  NYHA Classification: I; Canadian Angina Classification: Class 0: Asymptomatic;  Cardiology Procedures-Invasive: Pacemaker implant April 2015; Cardiology Procedures-Noninvasive: Treadmill  Dual Isotope 2008, Echocardiogram March 2010, Echo 04/28/13, Stress Echocardiogram March 2015, Echocardiogram March 2017, Echocardiogram June 2018; Left Ventricular Ejection Fraction : LVEF of 60% documented via echocardiogram on 07/28/2016  ___  FAMILY HISTORY  Father --  Stroke    __  CARDIAC RISK FACTORS     Tobacco Abuse: has never used tobacco; Family  History of Heart Disease: negative; Hyperlipidemia: positive;  Hypertension: positive;  Diabetes Mellitus: negative;  Prior History of Heart Disease: negative; Obesity: negative;  Sedentary Life Style:negative; [id number]; Menopausal :not applicable  __  SOCIAL HISTORY     Alcohol Use: beer, wine, mixed drinks and 1-2 glasses/wk;  Smoking: Does not smoke; Never smoker ([id number]);  Diet: Regular diet without modifications and Caffeine use-1-2 per day; Lifestyle: Married;  Exercise: Exercises regularly and 2-3x/wk:Tennis; Seat Belt Use: always;  Occupation: A Interior and spatial designer at [employer]; Sexual Activity: did not discuss sexual history;  Residence:  lives with wife; Place of Birth: [place];   __   PHYSICAL  EXAMINATION    Vital Signs:  Blood Pressure:  160/92 Sitting, Right arm, regular  cuff    Weight: 193.20 lbs.  Height: 69.5"   BMI: 28.12   Pulse: 77/min. ECG        Constitutional: He's a normally developed man who was in no distress Skin: surgical scars well-healed  Head: Normocephalic, normal hair pattern, no masses or tenderness Eyes: conjunctivae and lids unremarkable  ENT: No pallor or cyanosis Neck: JVP normal, no carotid bruit, thyroid not enlarged, no thyromegaly  Chest: clear to auscultation bilaterally, no tenderness to palpation, normal respiratory excursion, no intercostal retraction Cardiac : Regular rhythm, No S3 or S4, S1 normal, S2 normal. Apical impulse not displaced, no murmurs, gallops or rubs detected. Abdomen: Abdomen soft, bowel  sounds normoactive, no masses, no hepatosplenomegaly, non-tender, no bruits Peripheral Pulses: bilateral radial pulse(s) 2+  Extremities/Back: No deformities, clubbing, cyanosis, erythema or edema observed. There are no spinal abnormalities noted. Normal muscle strength and tone.  Psychiatric: normal memory Neurological: No gross motor or sensory deficits noted, affect appropriate,  oriented to time, person and place.   __    Medications added today by the physician:    ECG:  Normal sinus rhythm with appropriate pacing    IMPRESSIONS:  1. Paroxysmal atrial flutter  2. Sick sinus syndrome   3. Status post pacemaker  4. Hypertension  5. Hyperlipidemia  6. Chronic anticoagulation with Eliquis  7. Sleep apnea on CPAP      RECOMMENDATIONS:  1. The patient is doing well with the current medical therapy. The strategy  is anticoagulation and rate control. No changes are planned.  2. The pacemaker is functioning normally and he will continue with transtelephonic surveillance and yearly follow-up in our clinic.  3. Lipids will be checked once or twice per year.   4. The patient was encouraged to continue a full and active lifestyle with particular changes of diet exercise  weight loss and heart healthy lifestyle.  5. The patient will be relocating following his retirement and likely will return to [place].  Obviously we will help in the transition of care and provide records to his next team of physicians.      This note was generated by the [facility] EMR system/Dragon speech recognition and may contain errors or omissions not intended by the user.  Grammatical errors, random word insertions, deletions, pronoun errors, and incomplete sentences are occasional consequences of this technology due to software limitations. Not all errors are caught or corrected. If there are questions or concerns about  the content of this note or information contained within the body of this dictation, they should be addressed directly with the author for clarification.    Alden Hipp, MD, [facility]      cc: Carleene Cooper MD    ____________________________   TODAYS ORDERS  Diet mgmt edu, guidance and counseling TODAY  Return Visit with NP/PA 6 months  12 Lead ECG Today

## 2020-05-10 NOTE — Progress Notes (Signed)
[facility]  [address] [place], [place] [address]     [name]    Date of Visit:  06/11/2015  Date of Birth: 01-Jan-1935  Age: 86 yrs.   Medical Record Number: [id number]  __  CURRENT DIAGNOSES     1. Paroxysmal atrial fibrillation,  I48.0  2. Typical atrial flutter, I48.3  3. Sick sinus syndrome, I49.5  4. Syncope and collapse, R55  5. Device check cardiac pacemaker, Z45.010  6. Presence of cardiac pacemaker, Z95.0  7. Essential (primary) hypertension, I10   8. Hypertensive heart disease without heart failure, I11.9  __  ALLERGIES    Penicillins, Rash   __  MEDICATIONS     1. Lisinopril 40 mg Tablet, 1 po q am  2. Lipitor 20 mg Tablet, 1 po qd  3. Singulair 10 mg Tablet, 1 po qd  4.  Albuterol Sulfate 90 mcg/Actuation HFA Aerosol Inhaler, inh-2 po puffs bid prn  5. Nasonex 50 mcg/Actuation Spray, Non-Aerosol, nasal spray-1 spray bid  6. Nexium 40 mg capsule,delayed release, 1 po qd  7. ciclesonide HFA 160 mcg/actuation  aerosol inhaler, take as directed  8. Mega Multivitamin For Men 200 mcg-175 mcg-250 mcg tablet, po qd  9. Fish Oil 300 mg capsule, po qd  10. amlodipine 10 mg tablet, 1 po qd  11. hydrochlorothiazide 25 mg tablet, 1 po q am  12. Eliquis  5 mg tablet, 1 po bid  13. montelukast 10 mg tablet, 1 po qd  14. ProAir HFA 90 mcg/actuation aerosol inhaler, 1 puff for wheezing q 4 hours  __  CHIEF COMPLAINT/REASON FOR VISIT   Followup of Paroxysmal atrial fibrillation  __  HISTORY OF PRESENT ILLNESS  [name] is a very pleasant 86 year old gentleman with a history of  paroxysmal atrial fibrillation and atrial flutter and sick sinus syndrome, who is status post Medtronic dual-chamber pacemaker implant. He continues on 5 mg b.i.d. of Eliquis for anticoagulation. He reports to Clinic today for followup from his last office  visit and to discuss his recent echocardiogram results. He does report that since his last office visit he has continued to do well and is back on the tennis  courts playing at his own pace, but hopes to be back to his aggressive tennis group within the  next month to two months. He denies any recent chest pain, palpitations, lightheadedness, dizziness, syncope, or near syncope.   __  PAST HISTORY      Past Medical Illnesses: syncope-2008, HTN;  Past Cardiac Illnesses : Atrial Flutter, -Cardioverted in [place] 2015, Pauses of 5-6 seconds while in Aflutter in [place], Atrial fibrillation-Paroxysmal; Infectious Diseases: No previous  history of significant infectious diseases.; Surgical Procedures: 50 yrs ago-Shldr reduction post subluxation;1978-Hemorrhoidectomy;  Trauma History: 50 yrs ago-shldr subluxation; NYHA Classification: I;  Canadian Angina Classification: Class 0: Asymptomatic; Cardiology Procedures-Invasive: Pacemaker implant  April 2015; Cardiology Procedures-Noninvasive: Treadmill Dual Isotope 2008, Echocardiogram March 2010, Echo 04/28/13, Stress Echocardiogram March 2015,  Echocardiogram March 2017; Left Ventricular Ejection Fraction: LVEF of 60% documented via echocardiogram on 04/16/2015   ___  FAMILY HISTORY  Father -- Stroke     __  CARDIAC RISK FACTORS     Tobacco Abuse: has never used tobacco;  Family History of Heart Disease: negative; Hyperlipidemia: positive;  Hypertension: positive;  Diabetes Mellitus: negative;  Prior History of Heart Disease: negative; Obesity: negative;  Sedentary Life Style:negative; WUX:[id number]; Menopausal :not applicable  __  SOCIAL HISTORY    Alcohol Use : beer,  wine, mixed drinks and 1-2 glasses/wk; Smoking: Does not smoke; Never smoker ([id number]); Diet : Regular diet without modifications and Caffeine use-1-2 per day; Lifestyle: Married; Exercise : Exercises regularly and 2-3x/wk:Tennis; Seat Belt Use: always; Occupation : A Interior and spatial designer at [employer]; Sexual Activity: did not discuss sexual history; Residence :  lives with wife; Place of Birth: [place];   __   PHYSICAL EXAMINATION    Vital Signs:  Blood Pressure:   130/82 Sitting,  Left arm, regular cuff  130/82 Sitting, Right arm, regular cuff    Weight: 182.00 lbs.   Height: 69"  BMI: 27   Pulse:  80/min.   Respirations: 18/min.       Constitutional:  He's a normally developed man who was in no distress Skin: surgical scars well-healed Head : Normocephalic, normal hair pattern, no masses or tenderness Eyes: conjunctivae and lids unremarkable  ENT: No pallor or cyanosis Neck:  carotid pulses are full and equal bilaterally, no JVD Chest: clear to auscultation bilaterally, normal respiratory excursion, no intercostal retraction,  no use of accessory muscles Cardiac: Regular rhythm, No S3 or S4, S1 normal, S2 normal. Apical impulse not displaced, no murmurs, gallops or rubs detected.  Abdomen: Abdomen soft, bowel sounds normoactive, no masses, no hepatosplenomegaly, non-tender, no bruits Peripheral Pulses : bilateral radial pulse(s) 2+ Extremities/Back: No deformities, clubbing, cyanosis, erythema or edema  observed. There are no spinal abnormalities noted. Normal muscle strength and tone. Psychiatric: normal  memory Neurological: No gross motor or sensory deficits noted, affect appropriate, oriented to time,  person and place.   __    Medications added today by the physician:    IMPRESSIONS:    1. Paroxysmal atrial fibrillation. He continues on Eliquis with no recurrences.  2. History of atrial flutter with no further recurrences.  3. Echocardiogram done 16 April 2015 with ejection fraction of 60%, mild mitral regurgitation,   mild  tricuspid regurgitation, and mild aortic stenosis with peak gradient of 17 mmHg, mean   gradient of 8.0 mmHg, and an aortic valve area of 1.8 cm2.  4. Sick sinus syndrome, status post Medtronic dual-chamber pacemaker implant.     RECOMMENDATION AND DISCUSSION:  From an arrhythmia standpoint Mr. Frayre continues to do well and has had no further recurrences of atrial fibrillation or any arrhythmias. As such, I did discuss with him  his recent echocardiogram results.  Additionally I did discuss with him that while he is increasing his cardiovascular endurance that he should pay close attention and should not push himself too hard too fast, as he also had his hip replaced in January  of this year. Otherwise I did recommend no changes at this time to the status quo, but did recommend that he follow up in nine months' time with Dr. Ida Rogue or myself on a day that Dr. Ida Rogue is available for direct in-Clinic consultation. Mr. Cueva  was amicable to the plan and will maintain the status quo for now and will follow up with our office in nine months' time or sooner should the need arise.     Karis Juba, PA-C    JO/tutbh     cc: Chrystie Nose MD    EL

## 2020-05-10 NOTE — Progress Notes (Signed)
[name]    Date of Visit:  10/18/2016  Date of Birth: 07/31/1934  Age: 86 yrs.   Medical Record Number: [id number]  __  CURRENT DIAGNOSES     1. Mitral valve insufficiency nonrheumatic,  I34.0  2. Aortic valve stenosis non-rheumatic, I35.0  3. Ventricular tachycardia, I47.2  4. Paroxysmal atrial fibrillation, I48.0  5. Typical atrial flutter, I48.3  6. Sick sinus syndrome, I49.5  7. Syncope and collapse, R55   8. Device check cardiac pacemaker, Z45.010  9. Essential (primary) hypertension, I10  10. Hypertensive heart disease without heart failure, I11.9  11. Presence of cardiac pacemaker, Z95.0  __   ALLERGIES    Penicillins, Rash  __  MEDICATIONS      1. ciclesonide HFA 160 mcg/actuation aerosol inhaler, take as directed  2. Eliquis 5 mg tablet, 1 po bid  3. esomeprazole magnesium DR 40 mg granules delayed release for susp, 1 po qd  4. hydrochlorothiazide 25 mg tablet, 1 po  q am  5. Lipitor 20 mg Tablet, 1 po qd  6. lisinopril 40 mg tablet, 1/2 po q am  7. montelukast 10 mg tablet, 1 po qd  8. Nasonex 50 mcg/Actuation Spray, Non-Aerosol, nasal spray-1 spray bid  9. olopatadine 0.1 % eye drops, as directed   10. Patanol 0.1 % eye drops, 1 drop both eyes qd  11. ProAir HFA 90 mcg/actuation aerosol inhaler, 1 puff for wheezing q 4 hours  12. Vitamin D2 50,000 unit capsule, 1po qweekly  13. Vitamin D2 50,000 unit capsule, once a week  __   CHIEF COMPLAINT/REASON FOR VISIT  Followup of Syncope and collapse  __  HISTORY OF PRESENT ILLNESS   [name] is an 86 year old gentleman with history of sick sinus syndrome and syncope, status post pacemaker. He has had paroxysmal atrial fibrillation. He had prior syncope, which was suspected to be a vagal episode after hip surgery. More recently he  was hospitalized at [facility]. He had had a meal, was sitting on a barstool, felt thirsty, got some cold water and felt weak, lightheaded and went out. He did not hurt himself. His pacemaker was checked  at that time, and it showed normal  function. He was thought to have orthostatic intolerance or vasovagal-type syncope. He presents now for evaluation. His blood pressure medications have been cut back. He is down to 20 of lisinopril, and his amlodipine has been off as his blood pressures  have gone lower. He presents now for re-evaluation. He has not had bleeding on anticoagulation.    Arrhythmia information was pulled off his device. He had brief episodes of atrial fibrillation not associated with his episode.  __   PAST HISTORY     Past Medical Illnesses: syncope-2008, HTN;   Past Cardiac Illnesses: Atrial Flutter, -Cardioverted in [place] 2015, Pauses of 5-6 seconds while in Aflutter in [place], Atrial fibrillation-Paroxysmal;  Infectious Diseases: No previous history of significant infectious diseases.; Surgical Procedures: 50 yrs  ago-Shldr reduction post subluxation;1978-Hemorrhoidectomy; Trauma History: 50 yrs ago-shldr subluxation;  NYHA Classification: I; Canadian Angina Classification: Class 0: Asymptomatic;  Cardiology Procedures-Invasive: Pacemaker implant April 2015; Cardiology Procedures-Noninvasive: Treadmill  Dual Isotope 2008, Echocardiogram March 2010, Echo 04/28/13, Stress Echocardiogram March 2015, Echocardiogram March 2017, Echocardiogram June 2018; Left Ventricular Ejection Fraction : LVEF of 60% documented via echocardiogram on 07/28/2016  ___  FAMILY HISTORY  Father --  Stroke    __  CARDIAC RISK FACTORS     Tobacco Abuse: has never used tobacco;  Family  History of Heart Disease: negative; Hyperlipidemia: positive;  Hypertension: positive;  Diabetes Mellitus: negative;  Prior History of Heart Disease: negative; Obesity: negative;  Sedentary Life Style:negative; ZOX:[id number]; Menopausal :not applicable  __  SOCIAL HISTORY     Alcohol Use: beer, wine, mixed drinks and 1-2 glasses/wk; Smoking: Does not smoke; Never smoker ([id number]);  Diet: Regular diet without modifications and Caffeine use-1-2 per day;  Lifestyle: Married;  Exercise: Exercises regularly and 2-3x/wk:Tennis; Seat Belt Use: always;  Occupation: A Interior and spatial designer at [employer]; Sexual Activity: did not discuss sexual history;  Residence:  lives with wife; Place of Birth: [place];   __   PHYSICAL EXAMINATION    Vital Signs:  Blood Pressure: 120/80 HR 76         Constitutional: He's a normally developed man who was in no distress Skin: surgical scars well-healed  Head: Normocephalic, normal hair pattern, no masses or tenderness Eyes: conjunctivae and lids unremarkable  ENT: No pallor or cyanosis Neck: JVP normal, no carotid bruit, thyroid not enlarged, no thyromegaly  Chest: clear to auscultation bilaterally, no tenderness to palpation, normal respiratory excursion, no intercostal retraction Cardiac : Regular rhythm, No S3 or S4, S1 normal, S2 normal. Apical impulse not displaced, no murmurs, gallops or rubs detected. Abdomen: Abdomen soft, bowel  sounds normoactive, no masses, no hepatosplenomegaly, non-tender, no bruits Peripheral Pulses: bilateral radial pulse(s) 2+  Extremities/Back: No deformities, clubbing, cyanosis, erythema or edema observed. There are no spinal abnormalities noted. Normal muscle strength and tone.  Psychiatric: normal memory Neurological: No gross motor or sensory deficits noted, affect appropriate,  oriented to time, person and place.   __  ASSESSMENT:   [name] is an 86 year old gentleman with history of atrial arrhythmia and pauses, status post pacemaker. He had recent syncope thought due to orthostasis or neurocardiogenic. His blood  pressure medications have already been cut back due to some lower blood pressures. It may be reasonable to cut them back further if his blood pressures are still well controlled. At this point, I will plan to see him back in six months, but if he has  anymore lightheadedness or syncope, I will need to see him back sooner if only to cut his medications back further.    IMPRESSIONS:   1. Syncope with  prior history suggestive of vasovagal syncope.  2. Paroxysmal atrial flutter and fibrillation  with low burden.  3. Sick sinus syndrome, status post Medtronic pacemaker.  4. Eliquis anticoagulation.  5. Hypertension.    RECOMMENDATIONS:   1. Continue remote device monitoring.  2. Re-evaluation in six months or sooner  should he have worsening lightheadedness.  3. If his blood pressure is on the lower side, it would be reasonable to cut back further,   especially lisinopril or hydrochlorothiazide.    Ida Rogue, MD, [facility]     Tid: [id number]     cc: Carleene Cooper MD    lk    ds

## 2020-05-10 NOTE — Progress Notes (Signed)
[facility] OFFICE  [address] [place], [place] [address]     [name]    Date of Visit:  01/28/2015  Date of Birth: [birth year]  Age: 86 yrs.   Medical Record Number: [id number]  __  CURRENT DIAGNOSES     1. Hypertensive heart disease without  heart failure, I11.9  2. Paroxysmal atrial fibrillation, I48.0  3. Typical atrial flutter, I48.3  4. Sick sinus syndrome, I49.5  5. Device check cardiac pacemaker, Z45.010  6. Presence of cardiac pacemaker, Z95.0  7. Essential  (primary) hypertension, I10  8. Syncope and collapse, R55  __  ALLERGIES    Penicillins,  Rash  __  MEDICATIONS     1. Lisinopril 40 mg Tablet, 1 po q am  2. Lipitor 20 mg Tablet,  1 po qd  3. Singulair 10 mg Tablet, 1 po qd  4. Albuterol Sulfate 90 mcg/Actuation HFA Aerosol Inhaler, inh-2 po puffs bid prn  5. Nasonex 50 mcg/Actuation Spray, Non-Aerosol, nasal spray-1 spray bid  6. Nexium 40 mg capsule,delayed release,  1 po qd  7. ciclesonide HFA 160 mcg/actuation aerosol inhaler, take as directed  8. Mega Multivitamin For Men 200 mcg-175 mcg-250 mcg tablet, po qd  9. Fish Oil 300 mg capsule, po qd  10. amlodipine 10 mg tablet, 1 po qd  11. hydrochlorothiazide  25 mg tablet, 1 po q am  12. Eliquis 5 mg tablet, 1 po bid  __  CHIEF COMPLAINT/REASON FOR VISIT  Followup of Paroxysmal atrial fibrillation   __  HISTORY OF PRESENT ILLNESS  The patient is a 86 year old gentleman with a history of atrial flutter with long pauses, who was cardioverted and  underwent a pacemaker implant in April of 2015. He has done well since that time. His pacemaker was interrogated earlier this month, and he is paced 88.3% of the time. There was no breakthrough atrial fibrillation and he had some salvos of PACs. He otherwise  feels well from a cardiac perspective. He denies any chest pain, pressure, heaviness, tightness, squeezing, or burning sensations. He has had no shortness of breath, lightheadedness, or syncope. He comes in today  because of the need for elective right  hip replacement by Dr. Ollen Gross at [facility] in [place], [place] on March 08, 2015. In light of the above, he presents for preoperative assessment.   __  PAST HISTORY      Past Medical Illnesses: syncope-2008, HTN;  Past Cardiac Illnesses : Atrial Flutter, -Cardioverted in [place] 2015, Pauses of 5-6 seconds while in Aflutter in [place], Atrial fibrillation-Paroxysmal; Infectious Diseases: No previous  history of significant infectious diseases.; Surgical Procedures: 50 yrs ago-Shldr reduction post subluxation;1978-Hemorrhoidectomy;  Trauma History: 50 yrs ago-shldr subluxation; NYHA Classification: I;  Canadian Angina Classification: Class 0: Asymptomatic; Cardiology Procedures-Invasive: Pacemaker implant  April 2015; Cardiology Procedures-Noninvasive: Treadmill Dual Isotope 2008, Echocardiogram March 2010, Echo 04/28/13, Stress Echocardiogram March 2015;  Left Ventricular Ejection Fraction: LVEF of 45% documented via echocardiogram on 04/28/2013  ___   FAMILY HISTORY  Father -- Stroke    __  CARDIAC RISK FACTORS     Tobacco Abuse : has never used tobacco; Family History of Heart Disease: negative; Hyperlipidemia : positive; Hypertension: positive;  Diabetes Mellitus : negative; Prior History of Heart Disease: negative; Obesity : negative; Sedentary Life Style:negative; JWJ:[id number];  Menopausal:not applicable  __  SOCIAL HISTORY     Alcohol Use: beer, wine, mixed drinks and 1-2 glasses/wk; Smoking: Does not smoke;  Never smoker ([id number]);  Diet: Regular diet without modifications and Caffeine use-1-2 per day; Lifestyle: Married;  Exercise: Exercises regularly and 2-3x/wk:Tennis; Seat Belt Use: always;  Occupation: A Interior and spatial designer at [employer]; 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  Height:  69.5"  BMI: 26   Pulse: 81/min.    Respirations: 18/min.       Constitutional: Cooperative, alert and oriented,well developed,  well nourished, in no acute distress. Skin: surgical scars well-healed Head : Normocephalic, normal hair pattern, no masses or tenderness Eyes: conjunctivae and lids unremarkable  ENT: No pallor or cyanosis Neck: JVP normal, no carotid bruit, thyroid not enlarged  Chest: clear to auscultation bilaterally, normal A-P diameter, Normal symmetry Cardiac: Regular rhythm,  S1 normal, S2 normal, no murmurs Abdomen: Abdomen soft, bowel sounds normoactive, no masses, no hepatosplenomegaly, non-tender, no bruits  Peripheral Pulses: pulses full and equal in all extremities Extremities/Back: No deformities, clubbing,  cyanosis, erythema or edema observed. There are no spinal abnormalities noted. Normal muscle strength and tone. Psychiatric: normal memory  Neurological: No gross motor or sensory deficits noted, affect appropriate, oriented to time, person and place.   __    Medications added today  by the physician:    ELECTROCARDIOGRAM:  Normal sinus rhythm at 81 beats per minute. Nonspecific ST and T wave changes are noted.     IMPRESSIONS AND RECOMMENDATIONS:  1. Preoperative  cardiac assessment: There is no cardiovascular contraindication toward   proceeding with hip surgery as planned. The patient's Eliquis should be discontinued three   days prior to surgery and then resumed postoperatively when surgically stable  and hemostasis   assured. The patient does not require any preoperative cardiovascular testing other than the   electrocardiogram obtained today. The patient's cardiovascular risk for the planned surgery   is felt to be low.   2. Paroxysmal  atrial flutter: No recurrence. He is doing well with medical therapy and the   pacemaker function is normal.   3. Chronic anticoagulation: Stable on Eliquis without bleeding tendencies.    Management is as outlined above.     Thank you  again for allowing [place] to participate in this patient's care.     Meighan Treto Reather Converse, [facility]    ARP/tumam    cc: Chrystie Nose MD  Loanne Drilling MD [phone]    rw   ____________________________  Christianne Dolin  ON:GEXBMWU Education ICD-10: I48.0 MedlinePlus Connect results for ICD-10 I48.0  Return Visit 30 MIN 1 year  12 Lead ECG Today

## 2020-05-10 NOTE — Progress Notes (Signed)
[facility]  [address] [place], [place] [address]     [name]    Date of Visit:  04/14/2015  Date of Birth: September 14, 1934  Age: 86 yrs.   Medical Record Number: [id number]  __  CURRENT DIAGNOSES     1. Paroxysmal atrial fibrillation,  I48.0  2. Sick sinus syndrome, I49.5  3. Syncope and collapse, R55  4. Device check cardiac pacemaker, Z45.010  5. Presence of cardiac pacemaker, Z95.0  6. Typical atrial flutter, I48.3  7. Essential (primary) hypertension, I10   8. Hypertensive heart disease without heart failure, I11.9  __  ALLERGIES    Penicillins, Rash   __  MEDICATIONS     1. Lisinopril 40 mg Tablet, 1 po q am  2. Lipitor 20 mg Tablet, 1 po qd  3. Singulair 10 mg Tablet, 1 po qd  4.  Albuterol Sulfate 90 mcg/Actuation HFA Aerosol Inhaler, inh-2 po puffs bid prn  5. Nasonex 50 mcg/Actuation Spray, Non-Aerosol, nasal spray-1 spray bid  6. Nexium 40 mg capsule,delayed release, 1 po qd  7. ciclesonide HFA 160 mcg/actuation  aerosol inhaler, take as directed  8. Mega Multivitamin For Men 200 mcg-175 mcg-250 mcg tablet, po qd  9. Fish Oil 300 mg capsule, po qd  10. amlodipine 10 mg tablet, 1 po qd  11. hydrochlorothiazide 25 mg tablet, 1 po q am  12. Eliquis  5 mg tablet, 1 po bid  13. montelukast 10 mg tablet, 1 po qd  14. ProAir HFA 90 mcg/actuation aerosol inhaler, 1 puff for wheezing q 4 hours  __  CHIEF COMPLAINT/REASON FOR VISIT   Followup of Paroxysmal atrial fibrillation  __  HISTORY OF PRESENT ILLNESS  [name] is a 86 year old gentleman who had initial presentation with  atrial flutter with slow rates, sinus bradycardia. Pacemaker was put in in April 2015. Since that time, we have not seen any atrial fibrillation in the better part of the last two years. He had recent syncopal episode after hip surgery. He had gone to  a new place for rehabilitation and was pushing himself on a bicycle. I suspect that he had a vagal episode due to a higher level of exertion than usual.  His pacemaker interrogation was fine at that time and is good to the office today as well. There is  no atrial fibrillation at this point.  __  PAST HISTORY     Past Medical Illnesses : syncope-2008, HTN;  Past Cardiac Illnesses: Atrial Flutter, -Cardioverted in [place] 2015, Pauses of 5-6  seconds while in Aflutter in [place], Atrial fibrillation-Paroxysmal; Infectious Diseases: No previous history of significant infectious diseases.;  Surgical Procedures: 50 yrs ago-Shldr reduction post subluxation;1978-Hemorrhoidectomy; Trauma History:  50 yrs ago-shldr subluxation; NYHA Classification: I; Canadian Angina Classification : Class 0: Asymptomatic; Cardiology Procedures-Invasive: Pacemaker implant April 2015; Cardiology Procedures-Noninvasive : Treadmill Dual Isotope 2008, Echocardiogram March 2010, Echo 04/28/13, Stress Echocardiogram March 2015; Left Ventricular Ejection Fraction: LVEF of 45%  documented via echocardiogram on 04/28/2013  ___  FAMILY HISTORY  Father --  Stroke    __  CARDIAC RISK FACTORS     Tobacco Abuse: has never used tobacco; Family  History of Heart Disease: negative; Hyperlipidemia: positive;  Hypertension: positive;  Diabetes Mellitus: negative;  Prior History of Heart Disease: negative; Obesity: negative;  Sedentary Life Style:negative; [id number]; Menopausal :not applicable  __  SOCIAL HISTORY     Alcohol Use: beer, wine, mixed drinks and 1-2 glasses/wk; Smoking: Does not smoke;  Never smoker ([id number]);  Diet: Regular diet without modifications and Caffeine use-1-2 per day; Lifestyle: Married;  Exercise: Exercises regularly and 2-3x/wk:Tennis; Seat Belt Use: always;  Occupation: A Interior and spatial designer at [employer]; Sexual Activity: did not discuss sexual history;  Residence:  lives with wife; Place of Birth: [place];   __   PHYSICAL EXAMINATION    Vital Signs:  Blood Pressure:  124/84 Sitting, Left arm, regular  cuff  120/90 Sitting, Right arm, regular cuff    Weight: 176.00 lbs.  Height:  69"  BMI: 26    Pulse: 80/min. Apical Regular    Respirations: 16/min. regular and relaxed      Constitutional: He's a normally developed man  who was in no distress Skin: surgical scars well-healed Head : Normocephalic, normal hair pattern, no masses or tenderness Eyes: conjunctivae and lids unremarkable  ENT: No pallor or cyanosis Neck: JVP normal, no carotid bruit, thyroid not enlarged  Chest: clear to auscultation bilaterally, normal A-P diameter, Normal symmetry Cardiac: Regular rhythm,  S1 normal, S2 normal, no murmurs Abdomen: Abdomen soft, bowel sounds normoactive, no masses, no hepatosplenomegaly, non-tender, no bruits  Peripheral Pulses: pulses full and equal in all extremities Extremities/Back: No deformities, clubbing,  cyanosis, erythema or edema observed. There are no spinal abnormalities noted. Normal muscle strength and tone. Psychiatric: normal memory  Neurological: No gross motor or sensory deficits noted, affect appropriate, oriented to time, person and place.   __    Medications added today  by the physician:      IMPRESSION/RECOMMENDATIONS:  [name] is a 86 year old gentleman with history of atrial flutter and atrial fibrillation with slow rates and sinus bradycardia status  post pacemaker in April 2015. He has had no recurrent atrial arrhythmia. He has normal device function, and recent episode likely due to higher than usual degree of exertion and his deconditioned state. I will not make any other medical changes at this  time. I will plan for echocardiogram to look at left atrial size and see if there is any change in LV function. I had a discussion with his daughter, who is a physician in [place], regarding anticoagulation in this particular case where he has  age of 86, hypertension and risk factors for stroke, but there has been no atrial fibrillation with correction of bradycardia. We discussed pros and cons of stopping anticoagulation and concerns of factors which give risk for stroke and  factors which  give risk to atrial fibrillation, and how there is not necessarily 1:1 correlation but likely a common origin. We will see him back afterwards to see what the echo shows, and if there is more arrhythmia and log input from Dr. Kathrin Greathouse as well as his daughter's  colleagues in [place].    Ida Rogue, MD, [facility]     Tid: [id number]    cc: Chrystie Nose MD    eca    ____________________________  Christianne Dolin  FA:OZHYQMV Education ICD-10: I48.0 MedlinePlus Connect results for ICD-10 I48.0  2D, color flow, doppler 1 week  EP Return Visit 2 months  Diet mgmt edu, guidance and counseling TODAY

## 2020-05-10 NOTE — Progress Notes (Signed)
[name]    Date of Visit:  12/21/2015  Date of Birth: October 31, 1934  Age: 86 yrs.   Medical Record Number: [id number]  __  CURRENT DIAGNOSES     1. Paroxysmal atrial fibrillation,  I48.0  2. Typical atrial flutter, I48.3  3. Sick sinus syndrome, I49.5  4. Essential (primary) hypertension, I10  5. Device check cardiac pacemaker, Z45.010  6. Presence of cardiac pacemaker, Z95.0  7. Hypertensive heart disease  without heart failure, I11.9  8. Syncope and collapse, R55  __  ALLERGIES    Penicillins,  Rash  __  MEDICATIONS     1. Lisinopril 40 mg Tablet, 1 po q am  2. Lipitor 20 mg Tablet,  1 po qd  3. Singulair 10 mg Tablet, 1 po qd  4. Albuterol Sulfate 90 mcg/Actuation HFA Aerosol Inhaler, inh-2 po puffs bid prn  5. Nasonex 50 mcg/Actuation Spray, Non-Aerosol, nasal spray-1 spray bid  6. Nexium 40 mg capsule,delayed release,  1 po qd  7. ciclesonide HFA 160 mcg/actuation aerosol inhaler, take as directed  8. Mega Multivitamin For Men 200 mcg-175 mcg-250 mcg tablet, po qd  9. Fish Oil 300 mg capsule, po qd  10. amlodipine 10 mg tablet, 1 po qd  11. hydrochlorothiazide  25 mg tablet, 1 po q am  12. Eliquis 5 mg tablet, 1 po bid  13. montelukast 10 mg tablet, 1 po qd  14. ProAir HFA 90 mcg/actuation aerosol inhaler, 1 puff for wheezing q 4 hours  __   CHIEF COMPLAINT/REASON FOR VISIT  Followup of Paroxysmal atrial fibrillation  __  HISTORY OF PRESENT ILLNESS   [name] is a very pleasant 86 year old gentleman with a history of paroxysmal atrial fibrillation and atrial flutter as well as sick sinus syndrome who is status post Medtronic dual chamber pacemaker implant and who continues on 5 mg b.i.d. of Eliquis  for anticoagulation. He reports to clinic today for follow up from his last office visit which was with [name] on Jun 11, 2015. He does report that since his last office visit, he has been doing extremely well and is back to playing tennis at full speed and  has had no issues whatsoever. He denies  any recent chest pain, palpitations, lightheadedness, dizziness, syncope or near-syncope.  __  PAST HISTORY      Past Medical Illnesses: syncope-2008, HTN;  Past Cardiac Illnesses : Atrial Flutter, -Cardioverted in [place] 2015, Pauses of 5-6 seconds while in Aflutter in [place], Atrial fibrillation-Paroxysmal; Infectious Diseases: No previous  history of significant infectious diseases.; Surgical Procedures: 50 yrs ago-Shldr reduction post subluxation;1978-Hemorrhoidectomy;  Trauma History: 50 yrs ago-shldr subluxation; NYHA Classification: I;  Canadian Angina Classification: Class 0: Asymptomatic; Cardiology Procedures-Invasive: Pacemaker implant  April 2015; Cardiology Procedures-Noninvasive: Treadmill Dual Isotope 2008, Echocardiogram March 2010, Echo 04/28/13, Stress Echocardiogram March 2015,  Echocardiogram March 2017; Left Ventricular Ejection Fraction: LVEF of 60% documented via echocardiogram on 04/16/2015   ___  FAMILY HISTORY  Father -- Stroke     __  CARDIAC RISK FACTORS     Tobacco Abuse: has never used tobacco;  Family History of Heart Disease: negative; Hyperlipidemia: positive;  Hypertension: positive;  Diabetes Mellitus: negative;  Prior History of Heart Disease: negative; Obesity: negative;  Sedentary Life Style:negative; JYN:[id number]; Menopausal :not applicable  __  SOCIAL HISTORY    Alcohol Use : beer, wine, mixed drinks and 1-2 glasses/wk; Smoking: Does not smoke; Never smoker ([id number]); Diet : Regular diet without modifications and Caffeine use-1-2 per day; Lifestyle: Married;  Exercise : Exercises regularly and 2-3x/wk:Tennis; Seat Belt Use: always; Occupation : A Interior and spatial designer at [employer]; Sexual Activity: did not discuss sexual history; Residence :  lives with wife; Place of Birth: [place];   __   PHYSICAL EXAMINATION    Vital Signs:  Blood Pressure:   110/74 Sitting, Left arm, regular cuff  114/80 Sitting, Right arm, regular cuff    Weight: 183.00 lbs.   Height: 69.5"  BMI: 26   Pulse:  80/min. Apical  Regular       Constitutional: He's a normally developed man who was in no distress  Skin: surgical scars well-healed Head: Normocephalic,  normal hair pattern, no masses or tenderness Eyes: conjunctivae and lids unremarkable ENT : No pallor or cyanosis Neck: JVP normal, no carotid  bruit, thyroid not enlarged, no thyromegaly Chest: clear to auscultation bilaterally, no tenderness to palpation, normal respiratory excursion, no intercostal  retraction Cardiac: Regular rhythm, No S3 or S4, S1 normal, S2 normal. Apical impulse not displaced, no murmurs, gallops or rubs detected.  Abdomen: Abdomen soft, bowel sounds normoactive, no masses, no hepatosplenomegaly, non-tender, no bruits Peripheral Pulses : bilateral radial pulse(s) 2+ Extremities/Back: No deformities, clubbing, cyanosis, erythema or edema  observed. There are no spinal abnormalities noted. Normal muscle strength and tone. Psychiatric: normal  memory Neurological: No gross motor or sensory deficits noted, affect appropriate, oriented to time,  person and place.   __    Medications added today by the physician:      IMPRESSIONS:   1. Paroxysmal atrial fibrillation. He continues on Eliquis with no recurrences.  a. Interrogation of his pacemaker which is listed elsewhere in the records did   reveal that he had a 0% atrial fibrillation burden.  2. History of atrial flutter  with no recurrences.  3. Sick sinus syndrome status post Medtronic dual chamber pacemaker implant.  4. Echocardiogram done April 16, 2015 with an ejection fraction of 60%, mild mitral regurgitation,   mild tricuspid regurgitation and mild  aortic stenosis with a peak gradient of 17 mmHg, a mean   gradient of 8.0 mmHg and a valve area calculated at 1.8 cm2.    RECOMMENDATIONS AND DISCUSSION:   From an arrhythmia standpoint, [name] continues to do well and has had no further recurrences of atrial fibrillation or any atrial arrhythmias. Subsequently, as he is doing so well and back up to full  speed with is exercise, I did recommend no changes  at this time to the status quo but did recommend that he follow up in nine months time with [name]. [name] was amicable to the plan and will maintain the status quo for now, and will follow up in our office in nine months time, or he will contact  our clinic sooner should the need arise.    Sherran Needs, P.A.-C.    JH/tuld     cc: Chrystie Nose MD     AP  ____________________________  Christianne Dolin  EP Return Visit 9 months  Pacemaker Clinic Appointment 3 months  Diet mgmt edu, guidance  and counseling TODAY

## 2020-05-10 NOTE — Progress Notes (Signed)
[facility]  [address] [place], [place] [address]     [name]    Date of Visit:  01/19/2016  Date of Birth: 25-Apr-1934  Age: 86 yrs.   Medical Record Number: [id number]  __  CURRENT DIAGNOSES     1. Paroxysmal atrial fibrillation,  I48.0  2. Typical atrial flutter, I48.3  3. Sick sinus syndrome, I49.5  4. Essential (primary) hypertension, I10  5. Device check cardiac pacemaker, Z45.010  6. Presence of cardiac pacemaker, Z95.0  7. Hypertensive heart disease  without heart failure, I11.9  8. Syncope and collapse, R55  __  ALLERGIES    Penicillins,  Rash  __  MEDICATIONS     1. Lipitor 20 mg Tablet, 1 po qd  2. Nasonex 50 mcg/Actuation  Spray, Non-Aerosol, nasal spray-1 spray bid  3. ciclesonide HFA 160 mcg/actuation aerosol inhaler, take as directed  4. amlodipine 10 mg tablet, 1 po qd  5. hydrochlorothiazide 25 mg tablet, 1 po q am  6. montelukast 10 mg tablet, 1 po  qd  7. ProAir HFA 90 mcg/actuation aerosol inhaler, 1 puff for wheezing q 4 hours  8. esomeprazole magnesium DR 40 mg granules delayed release for susp, 1 po qd  9. olopatadine 0.1 % eye drops, as directed  10. Vitamin D2 50,000 unit capsule,  once a week  11. Eliquis 5 mg tablet, 1 po bid  12. lisinopril 40 mg tablet, 1 po q am  __  CHIEF COMPLAINT/REASON FOR VISIT  Followup  of Paroxysmal atrial fibrillation  __  HISTORY OF PRESENT ILLNESS  The patient is a 86 year old gentleman who continues to work at the [employer] and has a history of atrial flutter with long pauses. He was cardioverted and underwent a pacemaker implant in April 2015. Since then he has done extremely well. He has also undergone a hip replacement at [facility]  in [place], [place] in January of this year and had a spectacular result. His daughter is a physician down there. Since his last visit with [place] about a year ago, all has been well. He is taking medications as prescribed. He believes his blood   pressure is under good control. He remains physically active and exercises regularly. He has had no chest pain, pressure, heaviness or tightness. He denies any shortness of breath. Overall, he is doing extremely well.   __   PAST HISTORY     Past Medical Illnesses: syncope-2008, HTN;   Past Cardiac Illnesses: Atrial Flutter, -Cardioverted in [place] 2015, Pauses of 5-6 seconds while in Aflutter in [place], Atrial fibrillation-Paroxysmal;  Infectious Diseases: No previous history of significant infectious diseases.; Surgical Procedures: 50 yrs  ago-Shldr reduction post subluxation;1978-Hemorrhoidectomy; Trauma History: 50 yrs ago-shldr subluxation;  NYHA Classification: I; Canadian Angina Classification: Class 0: Asymptomatic;  Cardiology Procedures-Invasive: Pacemaker implant April 2015; Cardiology Procedures-Noninvasive: Treadmill  Dual Isotope 2008, Echocardiogram March 2010, Echo 04/28/13, Stress Echocardiogram March 2015, Echocardiogram March 2017; Left Ventricular Ejection Fraction : LVEF of 60% documented via echocardiogram on 04/16/2015  ___  FAMILY HISTORY   Father -- Stroke    __  CARDIAC RISK FACTORS      Tobacco Abuse: has never used tobacco; Family History of Heart Disease: negative;  Hyperlipidemia: positive; Hypertension: positive;   Diabetes Mellitus: negative; Prior History of Heart Disease: negative;  Obesity: negative; Sedentary Life Style:negative; Age :positive; Menopausal:not applicable  __  SOCIAL HISTORY     Alcohol Use: beer, wine, mixed drinks  and 1-2 glasses/wk; Smoking: Does not smoke; Never smoker ([id number]);  Diet: Regular diet without modifications and Caffeine use-1-2 per day; Lifestyle: Married;  Exercise: Exercises regularly and 2-3x/wk:Tennis; Seat Belt Use: always;  Occupation: A Interior and spatial designer at [employer]; 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  Height: 69.50"  BMI: 27    Pulse: 73/min.       Constitutional: He's a normally  developed man who was in no distress Skin: surgical scars well-healed Head : Normocephalic, normal hair pattern, no masses or tenderness Eyes: conjunctivae and lids unremarkable  ENT: No pallor or cyanosis Neck:  JVP normal, no carotid bruit, thyroid not enlarged, no thyromegaly Chest: clear to auscultation bilaterally, no tenderness to palpation, normal respiratory  excursion, no intercostal retraction Cardiac: Regular rhythm, No S3 or S4, S1 normal, S2 normal. Apical impulse not displaced, no murmurs, gallops or rubs  detected. Abdomen: Abdomen soft, bowel sounds normoactive, no masses, no hepatosplenomegaly, non-tender, no bruits  Peripheral Pulses: bilateral radial pulse(s) 2+ Extremities/Back : No deformities, clubbing, cyanosis, erythema or edema observed. There are no spinal abnormalities noted. Normal muscle strength and tone. Psychiatric : normal memory Neurological: No gross motor or sensory  deficits noted, affect appropriate, oriented to time, person and place.   __    Medications added today by the physician:  Eliquis 5 mg tablet,  1 po bid, 180  lisinopril 40 mg tablet, 1 po q am, 90      ELECTROCARDIOGRAM:    Normal sinus rhythm at 73 beats per minute with nonspecific  ST depression and repolarization abnormalities.     IMPRESSIONS AND RECOMMENDATIONS:   1. Hypertension: The patient's blood pressure is under ideal  control. No changes are advised.   2. Paroxysmal atrial flutter: Rare breakthrough episodes on pacemaker monitoring. They seem to   occur once every three months or so.   3. Longterm chronic anticoagulation: The patient has raised questions  about the longterm use of   Eliquis. He has also discussed this with Dr. Carleene Cooper. I have explained to the patient   since he has an underlying atrial dysrhythmia and a CHADS2 score of  2 and a CHA2DS2-VASc   score of 3, this places him  in a yearly risk group of 3.2% to 4.0% for a stroke. Per nationally   accepted guidelines, the standard of care for medical therapy is chronic anticoagulation either   with warfarin or a NOAC. I have explained that the Eliquis actually lowers  his risk of a stroke   by about 65-70%. Aspirin alone would lower the risk only by about 25%. Given this discussion,   the patient understands entirely why he is taking it and agrees to continue its use.   4. Dyslipidemia: On appropriate therapy.  Labs should be checked twice per year.     Casper Harrison, MD, [facility]    ARP/tutlc     cc: Carleene Cooper MD     AP  ____________________________   Christianne Dolin  Return Visit 15 MIN 1 year  12 Lead ECG Today

## 2020-05-10 NOTE — Consults (Signed)
[place] OFFICE   [address] [place], [place] [address]     [name]    Date of Visit:  02/14/2018  Date of Birth: [birth year]  Age: 86 yrs.   Medical Record Number: [id number]  __  CURRENT DIAGNOSES     1. Obstructive Sleep Apnea (adult)  (pediatric), G47.33  2. Essential (primary) hypertension, I10  3. Hypertensive heart disease without heart failure, I11.9  4. Mitral valve insufficiency nonrheumatic, I34.0  5. Aortic valve stenosis non-rheumatic, I35.0  6. Ventricular  tachycardia, I47.2  7. Paroxysmal atrial fibrillation, I48.0  8. Typical atrial flutter, I48.3  9. Sick sinus syndrome, I49.5  10. Syncope and collapse, R55  11. Device check cardiac pacemaker, Z45.010  12. Presence of cardiac  pacemaker, Z95.0  __  ALLERGIES    Penicillins, Rash   __  MEDICATIONS     1. Lipitor 20 mg Tablet, 1 po qd  2. Nasonex 50 mcg/Actuation Spray, Non-Aerosol, nasal spray-1 spray bid  3. montelukast  10 mg tablet, 1 po qd  4. ProAir HFA 90 mcg/actuation aerosol inhaler, 1 puff for wheezing q 4 hours  5. esomeprazole magnesium DR 40 mg granules delayed release for susp, 1 po qd  6. olopatadine 0.1 % eye drops, as directed  7. Eliquis  5 mg tablet, 1 po bid  8. lisinopril 40 mg tablet, 1 po qd  9. hydrochlorothiazide 12.5 mg tablet, 1 po q am  10. multivitamin capsule, megaman, prn  __  CHIEF COMPLAINT/REASON FOR VISIT   osa  __  HISTORY OF PRESENT ILLNESS  Mr. Grim was referred by Dr. Leona Carry further evaluation and treatment of sleep-disordered breathing.      Mr. Markie is a very pleasant 86 year old male with a history of atrial fibrillation, hypertension, sleep apnea, here for discussion of the following. He was originally diagnosed with sleep apnea back in 2010 at which time he was having snoring, poor quality  sleep, and was feeling quite fatigued during the day. In fact, he remembers a time when he nodded off at a light and was very concerned. After starting with sleep testing, he shares he was  provided a CPAP device, which he used regularly for three years.  He noted immediate improvement in his sleep quality and energy during the day. Unfortunately, he started tapering off the device as he was feeling better and now for the last year or two has been feeling tired again.     Mr. Kucinski is typically  sleeping from 9 to 7 and wakes at least two times during sleep. 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 He is looking to retire in the next few months and retire back to [place],  [place] where he has a house.     We did review his prior test together in 2010 which revealed AHI of 24, RDI of 42, low saturation of 89% at that time. Checking his sleep compliance, it looks as if the last time he used the device was many  years ago in 2015 timeframe. At that time, he had an autoset machine 14-20.     It appears as if his weight has not changed dramatically since his initial testing time.     ESS 13. He denies drowsy driving.   __   PAST HISTORY     Past Medical Illnesses: syncope-2008, HTN, syncope 8/18;   Past Cardiac Illnesses:  Atrial Flutter, -Cardioverted in [place] 2015, Pauses of 5-6 seconds while in Aflutter in [place], Atrial fibrillation-Paroxysmal;  Infectious Diseases: No previous history of significant infectious diseases.; Surgical Procedures: 50 yrs  ago-Shldr reduction post subluxation;1978-Hemorrhoidectomy; Trauma History: 50 yrs ago-shldr subluxation;  NYHA Classification: I; Canadian Angina Classification: Class 0: Asymptomatic;  Cardiology Procedures-Invasive: Pacemaker implant April 2015; Cardiology Procedures-Noninvasive: Treadmill  Dual Isotope 2008, Echocardiogram March 2010, Echo 04/28/13, Stress Echocardiogram March 2015, Echocardiogram March 2017, Echocardiogram June 2018; Left Ventricular Ejection Fraction : LVEF of 60% documented via echocardiogram on 07/28/2016  ___  FAMILY HISTORY   Father -- Stroke    SOCIAL  HISTORY    Alcohol  Use: beer, wine, mixed drinks and 1-2 glasses/wk; Smoking: Does not smoke; Never smoker ([id number]);  Diet: Regular diet without modifications and Caffeine use-1-2 per day; Lifestyle: Married;  Exercise: Exercises regularly and 2-3x/wk:Tennis; Seat Belt Use: always;  Occupation: A Interior and spatial designer at [employer]; Sexual Activity: did not discuss sexual history;  Residence:  lives with wife; Place of Birth: [place];    __  REVIEW OF SYSTEMS    General:  Denies recent weight loss, weight gain, fever or chills or change in exercise tolerance.; Integumentary:  hair loss; Eyes: wears eye glasses/contact lenses; Ears, Nose, Throat, Mouth : Denies any hearing loss, epistaxis, hoarseness or difficulty speaking.;Respiratory:  Denies dyspnea, cough, wheezing or hemoptysis.; Cardiovascular: no new cp  Abdominal : Denies ulcer disease, hematochezia or melena.;Musculoskeletal :Denies any history of venous insufficiency, arthritic symptoms or back problems.; Neurological  : Denies any history of recurrent strokes, TIA, or seizure disorder.; Psychiatric: Denies any history  of depression, substance abuse or change in cognitive functions.; Endocrine: Denies any history of weight  change, heat/cold intolerance, polydipsia, or polyuria; Hematologic/Immunologic: Denies any food allergies,  seasonal allergies, bleeding disorders.  __  PHYSICAL EXAMINATION    Vital Signs :  Blood Pressure:        Constitutional:  He's a normally developed man who was in no distress Skin: warm and dry to touch Head : Normocephalic, s Eyes: conjunctivae and lids unremarkable  ENT: No pallor or cyanosis Chest: clear to auscultation  bilaterally,Cardiac: Regular rhythm, No S3 or S4, . Abdomen : Abdomen soft, Extremities/Back: No deformities, clubbing, cyanosis, erythema or edema observed. There are no spinal abnormalities noted. Normal muscle  strength and tone. Psychiatric: normal memory Neurological : No gross motor or sensory deficits noted,  affect appropriate, oriented to time, person and place.   __    Medications added today by the physician:     ASSESSMENT AND PLAN:  Mr. Vishnu Moeller is a pleasant 86 year old male with a history of sleep apnea, hypertension, atrial fibrillation, here for sleep medicine evaluation.     Obstructive sleep  apnea:  Moderate to severe noted back in 2010 with an AHI of 24, RDI of 42, for which he was using and benefitting from CPAP with excellent resolution of his fatigue and snoring. He stopped using the device due to the fact that he was feeling better;  however, has not used it for at least five years now. At this juncture he is feeling more tired and knows he should address his sleep. at this juncture, we will refer him for in-lab sleep testing as his sleep apnea was more severe and he also has a history  of hypertension, atrial fibrillation, and high pressure requirements. He will see me after the study and we can prescribe him a machine and start him on therapy at that time.  Hypertension/atrial fibrillation:  He understands the relationship  between the heart and sleep apnea and how treating sleep apnea can help reduce future cardiovascular risks.     Fatigue:  Multifactorial; however, clearly related partly to his sleep apnea. He knows to use his CPAP as soon as he gets it to  help reduce incidence of fatigue and car accidents and the like. Fortunately, he is not drowsy when he is driving.     Cristi Loron, MD, [facility], [facility]    AVP/tutlc     cc: Stephanie Coup MD  ____________________________   TODAYS ORDERS  Split Night SleepStudy and F/U SleepSpec 2 days

## 2020-05-10 NOTE — Progress Notes (Signed)
[facility] OFFICE   [address], [place], [place] [address]     [name]    Date of Visit:  03/01/2018  Date of Birth: 1934/12/20  Age: 86 yrs.   Medical Record Number: [id number]  __  CURRENT DIAGNOSES     1. Obstructive Sleep Apnea (adult)  (pediatric), G47.33  2. Essential (primary) hypertension, I10  3. Hypertensive heart disease without heart failure, I11.9  4. Mitral valve insufficiency nonrheumatic, I34.0  5. Aortic valve stenosis non-rheumatic, I35.0  6. Ventricular  tachycardia, I47.2  7. Paroxysmal atrial fibrillation, I48.0  8. Typical atrial flutter, I48.3  9. Sick sinus syndrome, I49.5  10. Syncope and collapse, R55  11. Device check cardiac pacemaker, Z45.010  12. Presence of cardiac  pacemaker, Z95.0  __  ALLERGIES    Penicillins, Rash   __  MEDICATIONS     1. Lipitor 20 mg Tablet, 1 po qd  2. Nasonex 50 mcg/Actuation Spray, Non-Aerosol, nasal spray-1 spray bid  3. montelukast  10 mg tablet, 1 po qd  4. ProAir HFA 90 mcg/actuation aerosol inhaler, 1 puff for wheezing q 4 hours  5. esomeprazole magnesium DR 40 mg granules delayed release for susp, 1 po qd  6. olopatadine 0.1 % eye drops, as directed  7. Eliquis  5 mg tablet, 1 po bid  8. lisinopril 40 mg tablet, 1 po qd  9. hydrochlorothiazide 12.5 mg tablet, 1 po q am  10. multivitamin capsule, megaman, prn  __  CHIEF COMPLAINT/REASON FOR VISIT   osa  __  HISTORY OF PRESENT ILLNESS  Mr. [name] is a pleasant 87 year old male who returned for sleep medicine followup. He had recent in-lab  sleep testing, which demonstrated moderate sleep apnea with an AHI and RDI of 15 and a low sat of 92%.    [name] shares that he was using CPAP well quite some time ago; however, lost weight and felt better and stopped using the device, now is  again feeling the same symptoms he had prior including snoring, gasping, choking, poor quality sleep, waking during sleep and fatigue. He is looking forward to get back to using CPAP.    ESS 10.  He denies drowsy driving.  __   PAST HISTORY     Past Medical Illnesses: syncope-2008, HTN, syncope 8/18;   Past Cardiac Illnesses: Atrial Flutter, -Cardioverted in [place] 2015, Pauses of 5-6 seconds while in Aflutter in [place], Atrial fibrillation-Paroxysmal;  Infectious Diseases: No previous history of significant infectious diseases.; Surgical Procedures: 50 yrs  ago-Shldr reduction post subluxation;1978-Hemorrhoidectomy; Trauma History: 50 yrs ago-shldr subluxation;  NYHA Classification: I; Canadian Angina Classification: Class 0: Asymptomatic;  Cardiology Procedures-Invasive: Pacemaker implant April 2015; Cardiology Procedures-Noninvasive: Treadmill  Dual Isotope 2008, Echocardiogram March 2010, Echo 04/28/13, Stress Echocardiogram March 2015, Echocardiogram March 2017, Echocardiogram June 2018; Left Ventricular Ejection Fraction : LVEF of 60% documented via echocardiogram on 07/28/2016  ___  FAMILY HISTORY   Father -- Stroke    SOCIAL HISTORY    Alcohol  Use: beer, wine, mixed drinks and 1-2 glasses/wk; Smoking: Does not smoke; Never smoker ([id number]);  Diet: Regular diet without modifications and Caffeine use-1-2 per day; Lifestyle: Married;  Exercise: Exercises regularly and 2-3x/wk:Tennis; Seat Belt Use: always;  Occupation: A Interior and spatial designer at [employer]; Sexual Activity: did not discuss sexual history;  Residence:  lives with wife; Place of Birth: [place];    __  REVIEW OF SYSTEMS    General:  Denies recent weight loss, weight gain, fever or  chills or change in exercise tolerance.; Integumentary:  hair loss; Eyes: wears eye glasses/contact lenses; Ears, Nose, Throat, Mouth : Denies any hearing loss, epistaxis, hoarseness or difficulty speaking.;Respiratory:  Denies dyspnea, cough, wheezing or hemoptysis.; Cardiovascular: no new cp  Abdominal : Denies ulcer disease, hematochezia or melena.;Musculoskeletal :Denies any history of venous insufficiency, arthritic symptoms or back problems.; Neurological  : Denies any  history of recurrent strokes, TIA, or seizure disorder.; Psychiatric: Denies any history  of depression, substance abuse or change in cognitive functions.; Endocrine: Denies any history of weight  change, heat/cold intolerance, polydipsia, or polyuria; Hematologic/Immunologic: Denies any food allergies,  seasonal allergies, bleeding disorders.  __  PHYSICAL EXAMINATION    Vital Signs :  Blood Pressure:  138/70 Sitting, Left arm, large cuff         Constitutional: He's a normally developed man who was in no distress Skin: warm and dry to touch  Head: Normocephalic, normal hair pattern, Eyes: conjunctivae  and lids unremarkable Psychiatric: normal memory  Neurological: No gross motor or sensory deficits noted, affect appropriate, oriented to time, person and place.   __    Medications added today  by the physician:    ASSESSMENT AND PLAN:   [name] is a pleasant 86 year old male with a history of sleep apnea, hypertension, atrial  fibrillation, here for sleep medicine followup.    Obstructive sleep apnea: Rediagnosed recently with moderate sleep apnea with an AHI and RDI of 15, low sat of 92%, and he is looking forward to getting back on using his CPAP machine after many  years of hiatus. A referral was placed for auto CPAP, which he is on 4/2 as he is looking forward to getting more energy and better quality sleep. He knows to follow up with [place] six weeks after starting. A referral was placed to his home care company of  choice after a list was provided for him.    Hypertension/atrial fibrillation: He understands that by treating his sleep apnea, he can help reduce future cardiovascular risks.    Fatigue: ESS of 10. However, he is more fatigued than usual,  and he feels that the last time he felt this way, CPAP helped his symptoms.    Cristi Loron, MD, [facility], [facility]     Tid: [phone]    cc: Carleene Cooper MD  ____________________________   TODAYS ORDERS  Sleep Compliance OV 6 weeks after  DME setup

## 2020-05-10 NOTE — Progress Notes (Signed)
[facility] OFFICE   [address], [address], [place], [place] [address]     TELEMEDICINE VISIT       CORDELLE, DAHMEN    Date of Visit:  05/10/2018  Date of Birth: [birth year]  Age: 86 yrs.   Medical Record Number: [id number]  __  CURRENT DIAGNOSES     1. Obstructive Sleep Apnea (adult)  (pediatric), G47.33  2. Essential (primary) hypertension, I10  3. Hypertensive heart disease without heart failure, I11.9  4. Mitral valve insufficiency nonrheumatic, I34.0  5. Aortic valve stenosis non-rheumatic, I35.0  6. Ventricular  tachycardia, I47.2  7. Paroxysmal atrial fibrillation, I48.0  8. Typical atrial flutter, I48.3  9. Sick sinus syndrome, I49.5  10. Syncope and collapse, R55  11. Presence of cardiac pacemaker, Z95.0  12. Device check cardiac pacemaker,  Z45.010  __  ALLERGIES    Penicillins, Rash   __  MEDICATIONS     1. Lipitor 20 mg Tablet, 1 po qd  2. Nasonex 50 mcg/Actuation Spray, Non-Aerosol, nasal spray-1 spray bid  3. montelukast  10 mg tablet, 1 po qd  4. ProAir HFA 90 mcg/actuation aerosol inhaler, 1 puff for wheezing q 4 hours  5. esomeprazole magnesium DR 40 mg granules delayed release for susp, 1 po qd  6. olopatadine 0.1 % eye drops, as directed  7. Eliquis  5 mg tablet, 1 po bid  8. multivitamin capsule, megaman, prn  9. Edarbyclor 40 mg-12.5 mg tablet, 1 po q am  __  CHIEF COMPLAINT/REASON FOR VISIT   osa  __  HISTORY OF PRESENT ILLNESS  The visit today was conducted via telemedicine due to COVID-19 precautions. The patient was in their home  and was informed and gave verbal consent to proceed.    Spoke with Mr. [name] today via telephone for telephone follow-up regarding his CPAP. After his name and date of birth reverified we discussed how he is doing.    He shares that  since starting auto CPAP he feels "quite well" and that his sleep is better. He also has more energy during the day. He shares that initially he had difficulty however now over the last week or 2 he is much more used to and  finding more benefit.     We reviewed his CPAP compliance data together which demonstrates excellent compliance of 83% with AHI reduction down to the 10 range with use of his auto CPAP 4-18 cm H2O. Average pressure appears to be 12. We did review more recent data and it shows  that the AHI is reduced down further to the 6 7 range. There appears to be a correlation with mask leak and AHI and he shares that he is doing better more recently.    ASSESSMENT AND PLAN:  Adain Geurin is a very pleasant 86 year old male with  a history of complex sleep disordered breathing in addition to atrial fibrillation and dysrhythmia    Sleep disordered breathing:  2019 in lab testing: AHI 15/RDI 15/low sat 92%  Longstanding for which he was using CPAP and recent and lab  testing demonstrated moderate sleep apnea for which he is using and benefiting from CPAP. He does have some small central periodic component which is not unexpected given his history of heart disease. At this juncture he is finding clinical benefit with  the device and we are both encouraged that he is more tolerating the device and would like to wait a few more months to see how his numbers do once  he gets more used to it. We also discussed that given his heart history he may have more optimal response  with bilevel or other devices however at this juncture he is quite happy with what he has. We agreed to check in 3 to 6 months to see how he is doing and can reconsider other options at that time. At this juncture he feels clinically better with the machine  and would like to continue using it.    This note was generated by the [facility] EMR system/Dragon speech recognition and may contain errors or omissions not intended by the user. Grammatical errors, random word insertions, deletions, pronoun errors,  and incomplete sentences are occasional consequences of this technology due to software limitations. Not all errors are caught or corrected. If there are questions or  concerns about the content of this note or information contained within the body of  this dictation, they should be addressed directly with the author for clarification.    10 minutes of time were spent on the phone with this patient discussing his care    Zollie Pee, MD, [facility], [facility]    CC: Carleene Cooper, MD  ____________________________  TODAYS ORDERS  Compliance Follow Up 15 MIN 3-6 months

## 2020-05-20 ENCOUNTER — Other Ambulatory Visit: Payer: Self-pay

## 2020-05-20 ENCOUNTER — Ambulatory Visit (HOSPITAL_COMMUNITY)
Admission: RE | Admit: 2020-05-20 | Discharge: 2020-05-20 | Disposition: A | Discharge status: Home / Self Care | Payer: Medicare Other | Source: Ambulatory Visit | Attending: Orthopedic Surgery | Admitting: Orthopedic Surgery

## 2020-05-20 DIAGNOSIS — M5459 Other low back pain: Secondary | ICD-10-CM | POA: Diagnosis present

## 2020-05-20 NOTE — Progress Notes (Signed)
[facility] today at [facility] for [facility] lumbar spine WO contrast. Patient has medtronic device. [facility] express sent to Mike-Medtr[employer] rep and [facility]. Orders received for DOO 90. Will re-program once scan is complete.

## 2020-05-31 DIAGNOSIS — M48061 Spinal stenosis, lumbar region without neurogenic claudication: Secondary | ICD-10-CM | POA: Insufficient documentation

## 2020-06-03 ENCOUNTER — Telehealth: Payer: Self-pay | Admitting: *Deleted

## 2020-06-03 MED ORDER — APIXABAN 5 MG PO TABS: 5.0000 mg | ORAL_TABLET | Freq: Two times a day (BID) | ORAL | 1 refills | Status: DC

## 2020-06-03 NOTE — Telephone Encounter (Signed)
Patient with diagnosis of afib on Eliquis for anticoagulation.    Procedure: lumbar ESI Date of procedure: 06/15/20  CHA2DS2-VASc Score = 3  This indicates a 3.2% annual risk of stroke. The patient's score is based upon: CHF History: No HTN History: Yes Diabetes History: No Stroke History: No Vascular Disease History: No Age Score: 2 Gender Score: 0  CrCl 61mL/min Platelet count 158K  Per office protocol, patient can hold Eliquis for 3 days prior to procedure.

## 2020-06-03 NOTE — Telephone Encounter (Signed)
   [facility] Pre-operative Risk Assessment    Patient Name: Isaiah Murphy  DOB: Jun 13, 1934  MRN: [id number]   HEARTCARE STAFF: - Please ensure there is not already an duplicate clearance open for this procedure. - Under Visit Info/Reason for Call, type in Other and utilize the format Clearance MM/DD/YY or Clearance TBD. Do not use dashes or single digits. - If request is for dental extraction, please clarify the # of teeth to be extracted.  Request for surgical clearance:  1. What type of surgery is being performed? LUMBAR ESI   2. When is this surgery scheduled? 06/15/20   3. What type of clearance is required (medical clearance vs. Pharmacy clearance to hold med vs. Both)? BOTH  4. Are there any medications that need to be held prior to surgery and how long? ELIQUIS x 3 DAYS PRIOR TO PROCEDURE   5. Practice name and name of physician performing surgery? [facility];    6. What is the office phone number? [phone] EXT [phone]   2.   What is the office fax number? [place]: [place]  8.   Anesthesia type (None, local, MAC, general) ? NONE LISTED   Julaine Hua 06/03/2020, 4:15 PM  _________________________________________________________________   (provider comments below)

## 2020-06-04 DIAGNOSIS — M5416 Radiculopathy, lumbar region: Secondary | ICD-10-CM | POA: Insufficient documentation

## 2020-06-04 NOTE — Telephone Encounter (Signed)
   Primary Cardiologist: Lesleigh Noe, MD  Chart reviewed as part of pre-operative protocol coverage. Given past medical history and time since last visit, based on ACC/AHA guidelines, Isaiah Murphy would be at acceptable risk for the planned procedure without further cardiovascular testing.   Patient with diagnosis of afib on Eliquis for anticoagulation.    Procedure: lumbar ESI Date of procedure: 06/15/20  CHA2DS2-VASc Score = 3  This indicates a 3.2% annual risk of stroke. The patient's score is based upon: CHF History: No HTN History: Yes Diabetes History: No Stroke History: No Vascular Disease History: No Age Score: 2 Gender Score: 0  CrCl 82mL/min Platelet count 158K  Per office protocol, patient can hold Eliquis for 3 days prior to procedure.  I will route this recommendation to the requesting party via Epic fax function and remove from pre-op pool.  Please call with questions.  Thomasene Ripple. Maddax Palinkas NP-C    06/04/2020, 7:23 AM [facility] [address] Office [phone] Fax [phone]

## 2020-06-15 ENCOUNTER — Telehealth: Payer: Self-pay | Admitting: Allergy and Immunology

## 2020-06-15 MED ORDER — CICLESONIDE 160 MCG/ACT IN AERS: INHALATION_SPRAY | RESPIRATORY_TRACT | 0 refills | Status: DC

## 2020-06-15 NOTE — Telephone Encounter (Signed)
Sent in refill of alvesco to [facility] order pt informed of me sending in the curtesy and that we would see him at end of the month with Dr Lucie Leather. He stated understanding

## 2020-06-15 NOTE — Telephone Encounter (Signed)
Patient called and states he would like a refill on Alvesco sent to [facility] in service. Patient has an appointment on 5/31 with Dr. Lucie Leather.   Please advise.

## 2020-06-23 ENCOUNTER — Telehealth: Payer: Self-pay | Admitting: Allergy and Immunology

## 2020-06-23 MED ORDER — CICLESONIDE 160 MCG/ACT IN AERS: INHALATION_SPRAY | RESPIRATORY_TRACT | 0 refills | Status: DC

## 2020-06-23 NOTE — Telephone Encounter (Signed)
Patient called and said that the pharmacy will not fill just on month supply, it has to be 3 month supply for the alvesco. [facility] [phone].

## 2020-06-23 NOTE — Telephone Encounter (Signed)
Spoke with patient, informed him he is due for an office visit which is why only a 30 day supply was sent in. Informed patient will send a 90 day supply and for him to keep his office visit in 2 weeks.

## 2020-06-30 ENCOUNTER — Ambulatory Visit (INDEPENDENT_AMBULATORY_CARE_PROVIDER_SITE_OTHER): Payer: Medicare Other

## 2020-06-30 DIAGNOSIS — I495 Sick sinus syndrome: Secondary | ICD-10-CM | POA: Diagnosis not present

## 2020-07-01 LAB — CUP PACEART REMOTE DEVICE CHECK
Battery Remaining Longevity: 23 mo
Battery Voltage: 2.94 V
Brady Statistic AP VP Percent: 93.56 %
Brady Statistic AP VS Percent: 0 %
Brady Statistic AS VP Percent: 6.44 %
Brady Statistic AS VS Percent: 0 %
Brady Statistic RA Percent Paced: 91.87 %
Brady Statistic RV Percent Paced: 99.99 %
Date Time Interrogation Session: 20220525090109
Implantable Lead Implant Date: 20150413
Implantable Lead Implant Date: 20150413
Implantable Lead Location: 753859
Implantable Lead Location: 753860
Implantable Lead Model: 5076
Implantable Lead Model: 5076
Implantable Pulse Generator Implant Date: 20150413
Lead Channel Impedance Value: 380 Ohm
Lead Channel Impedance Value: 437 Ohm
Lead Channel Impedance Value: 532 Ohm
Lead Channel Impedance Value: 608 Ohm
Lead Channel Pacing Threshold Amplitude: 0.375 V
Lead Channel Pacing Threshold Amplitude: 0.75 V
Lead Channel Pacing Threshold Pulse Width: 0.4 ms
Lead Channel Pacing Threshold Pulse Width: 0.4 ms
Lead Channel Sensing Intrinsic Amplitude: 1.25 mV
Lead Channel Sensing Intrinsic Amplitude: 1.25 mV
Lead Channel Sensing Intrinsic Amplitude: 12.5 mV
Lead Channel Sensing Intrinsic Amplitude: 12.5 mV
Lead Channel Setting Pacing Amplitude: 1.5 V
Lead Channel Setting Pacing Amplitude: 2.5 V
Lead Channel Setting Pacing Pulse Width: 0.4 ms
Lead Channel Setting Sensing Sensitivity: 0.9 mV

## 2020-07-06 ENCOUNTER — Other Ambulatory Visit: Payer: Self-pay

## 2020-07-06 ENCOUNTER — Ambulatory Visit (INDEPENDENT_AMBULATORY_CARE_PROVIDER_SITE_OTHER): Payer: Medicare Other | Admitting: Allergy and Immunology

## 2020-07-06 VITALS — BP 130/66 | HR 75 | Temp 98.1°F | Resp 14 | Ht 69.0 in | Wt 190.2 lb

## 2020-07-06 DIAGNOSIS — J3089 Other allergic rhinitis: Secondary | ICD-10-CM

## 2020-07-06 DIAGNOSIS — J4530 Mild persistent asthma, uncomplicated: Secondary | ICD-10-CM | POA: Diagnosis not present

## 2020-07-06 NOTE — Patient Instructions (Signed)
  1.  Continue Alvesco 160 - 1 inhalations 1-2 times per day depending on disease activity  2.  Continue Flonase - 1 spray each nostril 1-2  times per day depending on disease activity  3.  Continue montelukast 10 mg - 1 tablet 1 time per day  4.  If needed:   A.  Albuterol HFA -2 inhalations every 4-6 hours  B.  OTC antihistamine  5.  Return to clinic in 6 months or earlier if problem

## 2020-07-06 NOTE — Progress Notes (Signed)
[place] - [place] - [place] - [place] - [place]   Follow-up Note  Referring Provider: Creola Corn, MD Primary Provider: Creola Corn, MD Date of Office Visit: 07/06/2020  Subjective:   Isaiah Murphy (DOB: 1934/03/16) is a 86 y.o. male who [facility] in re-evaluation of the following:  HPI: Art returns to this clinic in reevaluation of asthma and allergic rhinitis.  His last visit to this clinic was 30 December 2019.  Ever since he has been consistently using a combination of Alvesco and Flonase for his airway disease he has really done very well and has not required the use of a short acting bronchodilator and can exert himself to the extent that his musculoskeletal issues allow him to do so and has not required a systemic steroid to treat a respiratory tract flare.  He did have an injection of a steroid in his back 2 weeks ago for what sounds like some form of arthritis giving rise to radiculopathy going down both his legs.  He is still in PT from a December 2021 hip replacement and is undergoing aquatic exercising.  Allergies as of 07/06/2020      Reactions   Penicillins Rash   Has patient had a PCN reaction causing immediate rash, facial/tongue/throat swelling, SOB or lightheadedness with hypotension: Yes Has patient had a PCN reaction causing severe rash involving mucus membranes or skin necrosis: Yes Has patient had a PCN reaction that required hospitalization: No Has patient had a PCN reaction occurring within the last 10 years: No If all of the above answers are "NO", then may proceed with Cephalosporin use. Rash at the injection site      Medication List      albuterol 108 (90 Base) MCG/ACT inhaler Commonly known as: VENTOLIN HFA Inhale 2 puffs into the lungs every 6 (six) hours as needed for wheezing or shortness of breath.   apixaban 5 MG Tabs tablet Commonly known as: ELIQUIS Take 1 tablet (5 mg total) by mouth  2 (two) times daily.   atorvastatin 20 MG tablet Commonly known as: LIPITOR Take 20 mg by mouth at bedtime.   ciclesonide 160 MCG/ACT inhaler Commonly known as: ALVESCO Use one puff twice daily to prevent cough or wheeze.  Increase to 2 puffs twice daily with asthma flare. Rinse, gargle and spit after use   Edarbyclor 40-12.5 MG Tabs Generic drug: Azilsartan-Chlorthalidone Take 1 tablet by mouth daily.   esomeprazole 40 MG capsule Commonly known as: NEXIUM Take 40 mg by mouth daily as needed (reflux).   fluticasone 50 MCG/ACT nasal spray Commonly known as: FLONASE Place 2 sprays into both nostrils daily.   HYDROcodone-acetaminophen 5-325 MG tablet Commonly known as: NORCO/VICODIN Take 1-2 tablets by mouth every 6 (six) hours as needed for severe pain.   Mega Multi Men Tabs Take 1 tablet by mouth once a week.   methocarbamol 500 MG tablet Commonly known as: ROBAXIN Take 1 tablet (500 mg total) by mouth every 6 (six) hours as needed for muscle spasms.   montelukast 10 MG tablet Commonly known as: SINGULAIR Take 10 mg by mouth daily as needed (allergies/ itching eye).   olopatadine 0.1 % ophthalmic solution Commonly known as: PATANOL Place 1 drop into both eyes daily.   polyethylene glycol 17 g packet Commonly known as: MIRALAX / GLYCOLAX Take 17 g by mouth daily as needed for mild constipation.   traMADol 50 MG tablet Commonly known as: ULTRAM Take 1-2  tablets (50-100 mg total) by mouth every 6 (six) hours as needed for moderate pain.   triamcinolone cream 0.1 % Commonly known as: KENALOG Apply 1 application topically 2 (two) times daily as needed (itching).       Past Medical History:  Diagnosis Date  . Acute blood loss anemia   . Allergic rhinitis   . Arthritis   . Constipation   . Dyslipidemia   . Dysrhythmia    history of paroxysmal A Fib and typical A Flutter   . GERD (gastroesophageal reflux disease)   . History of bronchitis   . History of sick  sinus syndrome   . History of syncope   . HLD (hyperlipidemia)   . Hypertension   . Hyponatremia   . Leukocytosis   . Moderate persistent asthma with acute exacerbation in adult   . Presence of permanent cardiac pacemaker   . Primary osteoarthritis of right hip   . Sleep apnea    pt states not currently using CPAP machine   . Unsteady gait     Past Surgical History:  Procedure Laterality Date  . CARDIOVERSION N/A 04/28/2013   Procedure: CARDIOVERSION;  Surgeon: Lars Masson, MD;  Location: [facility] ENDOSCOPY;  Service: Cardiovascular;  Laterality: N/A;  . HEMORRHOID SURGERY    . left shoulder surgery      secondary to torn ligament / subluxation  . TEE WITHOUT CARDIOVERSION N/A 04/28/2013   Procedure: TRANSESOPHAGEAL ECHOCARDIOGRAM (TEE);  Surgeon: Lars Masson, MD;  Location: [facility] ENDOSCOPY;  Service: Cardiovascular;  Laterality: N/A;  . TOTAL HIP ARTHROPLASTY Right 03/08/2015   Procedure: RIGHT TOTAL HIP ARTHROPLASTY ANTERIOR APPROACH;  Surgeon: Ollen Gross, MD;  Location: [facility] ORS;  Service: Orthopedics;  Laterality: Right;  . TOTAL HIP ARTHROPLASTY Left 01/07/2020   Procedure: TOTAL HIP ARTHROPLASTY ANTERIOR APPROACH;  Surgeon: Ollen Gross, MD;  Location: [facility] ORS;  Service: Orthopedics;  Laterality: Left;     Review of systems negative except as noted in HPI / PMHx or noted below:  Review of Systems  Constitutional: Negative.   HENT: Negative.   Eyes: Negative.   Respiratory: Negative.   Cardiovascular: Negative.   Gastrointestinal: Negative.   Genitourinary: Negative.   Musculoskeletal: Negative.   Skin: Negative.   Neurological: Negative.   Endo/Heme/Allergies: Negative.   Psychiatric/Behavioral: Negative.      Objective:   Vitals:   07/06/20 1107  BP: 130/66  Pulse: 75  Resp: 14  Temp: 98.1 F (36.7 C)  SpO2: 97%   Height: 5\' 9"  (175.3 cm)  Weight: 190 lb 3.2 oz (86.3 kg)   Physical Exam Constitutional:      Appearance: He is not  diaphoretic.  HENT:     Head: Normocephalic.     Right Ear: Tympanic membrane, ear canal and external ear normal.     Left Ear: Tympanic membrane, ear canal and external ear normal.     Nose: Nose normal. No mucosal edema or rhinorrhea.     Mouth/Throat:     Pharynx: Uvula midline. No oropharyngeal exudate.  Eyes:     Conjunctiva/sclera: Conjunctivae normal.  Neck:     Thyroid: No thyromegaly.     Trachea: Trachea normal. No tracheal tenderness or tracheal deviation.  Cardiovascular:     Rate and Rhythm: Normal rate and regular rhythm.     Heart sounds: Normal heart sounds, S1 normal and S2 normal. No murmur heard.   Pulmonary:     Effort: No respiratory distress.     Breath  sounds: Normal breath sounds. No stridor. No wheezing or rales.  Lymphadenopathy:     Head:     Right side of head: No tonsillar adenopathy.     Left side of head: No tonsillar adenopathy.     Cervical: No cervical adenopathy.  Skin:    Findings: No erythema or rash.     Nails: There is no clubbing.  Neurological:     Mental Status: He is alert.     Diagnostics:    Spirometry was performed and demonstrated an FEV1 of 2.16 at 84 % of predicted.  Assessment and Plan:   1. Asthma, well controlled, mild persistent   2. Perennial allergic rhinitis     1.  Continue Alvesco 160 - 1 inhalations 1-2 times per day depending on disease activity  2.  Continue Flonase - 1 spray each nostril 1-2  times per day depending on disease activity  3.  Continue montelukast 10 mg - 1 tablet 1 time per day  4.  If needed:   A.  Albuterol HFA -2 inhalations every 4-6 hours  B.  OTC antihistamine  5.  Return to clinic in 6 months or earlier if problem  Art is really doing very well on his plan and he will continue to use Alvesco and Flonase at a dose depended on his disease activity aiming for the least amount of medications required to control his disease state.  I will see him back in this clinic in 6 months or  earlier if there is a problem.  Laurette Schimke, MD Allergy / Immunology [facility]

## 2020-07-07 ENCOUNTER — Encounter: Payer: Self-pay | Admitting: Allergy and Immunology

## 2020-07-22 NOTE — Progress Notes (Signed)
Remote pacemaker transmission.   

## 2020-08-17 ENCOUNTER — Telehealth: Payer: Self-pay

## 2020-08-17 NOTE — Telephone Encounter (Signed)
Returning phone call.   Remote transmission. Remote transmission reviewed and no new findings were noted. Patient was concerned that his remote monitor came on last night. Explained to patient that his remote monitor connects to his pacemaker at night while he is asleep and it forced transmission. Patient appreciative of call.

## 2020-08-17 NOTE — Telephone Encounter (Signed)
Patient called in wanting a call returned to him to go over his transmission from today. He states his monitor kept going off and he wants reassurance on the reading. I saw some AF events were noted but I didn't mention it to the patient.

## 2020-09-13 ENCOUNTER — Telehealth: Payer: Self-pay

## 2020-09-13 NOTE — Telephone Encounter (Signed)
I left a message with the patient wife for the patient to call tech support to get help with his monitor.

## 2020-09-22 ENCOUNTER — Telehealth: Payer: Self-pay | Admitting: Internal Medicine

## 2020-09-22 NOTE — Telephone Encounter (Signed)
Successful telephone encounter to Isaiah Murphy to discuss his ability to use battery powered lawn equipment. Reviewed and provided Isaiah Murphy with [employer] Electromagnetic compatibility guide. Patient appreciative of information.

## 2020-09-22 NOTE — Telephone Encounter (Signed)
Patient would like to know if battery powered lawn equipment will interfere with his PPM. Please advise.   1. Has your device fired?  No   2. Is you device beeping?  No   3. Are you experiencing draining or swelling at device site?  No   4. Are you calling to see if we received your device transmission?  No   5. Have you passed out?  No

## 2020-09-30 ENCOUNTER — Telehealth: Payer: Self-pay

## 2020-09-30 NOTE — Telephone Encounter (Signed)
Pt called to make [place] aware that [employer] is sending him a new monitor. Monitor was ordered 09/27/2020 per [facility].

## 2020-10-14 ENCOUNTER — Other Ambulatory Visit: Payer: Self-pay | Admitting: Allergy and Immunology

## 2020-10-29 ENCOUNTER — Telehealth: Payer: Self-pay | Admitting: Internal Medicine

## 2020-10-29 NOTE — Telephone Encounter (Signed)
Patient called to say that he received his heart monitor in the mail today

## 2020-11-01 NOTE — Telephone Encounter (Signed)
No answer no voice mail  

## 2020-11-02 ENCOUNTER — Ambulatory Visit (INDEPENDENT_AMBULATORY_CARE_PROVIDER_SITE_OTHER): Payer: Medicare Other

## 2020-11-02 DIAGNOSIS — I442 Atrioventricular block, complete: Secondary | ICD-10-CM | POA: Diagnosis not present

## 2020-11-02 NOTE — Telephone Encounter (Signed)
I spoke with the patient and helped him send a transmission. Transmission is sent and he is put on a new schedule.

## 2020-11-03 LAB — CUP PACEART REMOTE DEVICE CHECK
Battery Remaining Longevity: 18 mo
Battery Voltage: 2.92 V
Brady Statistic AP VP Percent: 86.52 %
Brady Statistic AP VS Percent: 0 %
Brady Statistic AS VP Percent: 13.47 %
Brady Statistic AS VS Percent: 0.01 %
Brady Statistic RA Percent Paced: 83.71 %
Brady Statistic RV Percent Paced: 99.98 %
Date Time Interrogation Session: 20220927133736
Implantable Lead Implant Date: 20150413
Implantable Lead Implant Date: 20150413
Implantable Lead Location: 753859
Implantable Lead Location: 753860
Implantable Lead Model: 5076
Implantable Lead Model: 5076
Implantable Pulse Generator Implant Date: 20150413
Lead Channel Impedance Value: 342 Ohm
Lead Channel Impedance Value: 399 Ohm
Lead Channel Impedance Value: 456 Ohm
Lead Channel Impedance Value: 532 Ohm
Lead Channel Pacing Threshold Amplitude: 0.375 V
Lead Channel Pacing Threshold Amplitude: 0.75 V
Lead Channel Pacing Threshold Pulse Width: 0.4 ms
Lead Channel Pacing Threshold Pulse Width: 0.4 ms
Lead Channel Sensing Intrinsic Amplitude: 12.5 mV
Lead Channel Sensing Intrinsic Amplitude: 12.5 mV
Lead Channel Sensing Intrinsic Amplitude: 4.125 mV
Lead Channel Sensing Intrinsic Amplitude: 4.125 mV
Lead Channel Setting Pacing Amplitude: 1.5 V
Lead Channel Setting Pacing Amplitude: 2.5 V
Lead Channel Setting Pacing Pulse Width: 0.4 ms
Lead Channel Setting Sensing Sensitivity: 0.9 mV

## 2020-11-10 NOTE — Progress Notes (Signed)
Remote pacemaker transmission.   

## 2020-12-14 ENCOUNTER — Other Ambulatory Visit: Payer: Self-pay

## 2020-12-14 ENCOUNTER — Ambulatory Visit (INDEPENDENT_AMBULATORY_CARE_PROVIDER_SITE_OTHER): Payer: Medicare Other | Admitting: Allergy and Immunology

## 2020-12-14 VITALS — BP 140/90 | HR 78 | Temp 97.2°F | Resp 18 | Ht 69.5 in | Wt 191.0 lb

## 2020-12-14 DIAGNOSIS — J4531 Mild persistent asthma with (acute) exacerbation: Secondary | ICD-10-CM | POA: Diagnosis not present

## 2020-12-14 DIAGNOSIS — J3089 Other allergic rhinitis: Secondary | ICD-10-CM | POA: Diagnosis not present

## 2020-12-14 DIAGNOSIS — J4530 Mild persistent asthma, uncomplicated: Secondary | ICD-10-CM

## 2020-12-14 MED ORDER — ALVESCO 160 MCG/ACT IN AERS: INHALATION_SPRAY | RESPIRATORY_TRACT | 0 refills | Status: DC

## 2020-12-14 MED ORDER — ALBUTEROL SULFATE HFA 108 (90 BASE) MCG/ACT IN AERS: 2.0000 | INHALATION_SPRAY | Freq: Four times a day (QID) | RESPIRATORY_TRACT | 5 refills | Status: DC | PRN

## 2020-12-14 MED ORDER — FLUTICASONE PROPIONATE 50 MCG/ACT NA SUSP: 2.0000 | Freq: Every day | NASAL | 5 refills | Status: DC

## 2020-12-14 NOTE — Progress Notes (Signed)
[place] - [place] - [place] - [place] - [place]   Follow-up Note  Referring Provider: Creola Corn, MD Primary Provider: Creola Corn, MD Date of Office Visit: 12/14/2020  Subjective:   Isaiah Murphy (DOB: December 22, 1934) is a 86 y.o. 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  His last visit to this clinic was 06 Jul 2020.  Overall he has really done well since his last visit.  He has not required a systemic steroid or an antibiotic for any type of airway issue.  He did require a steroid injection for an L4/5 vertebral issue the past 6 months.  He rarely uses a short acting bronchodilator.  He is beginning to play tennis again following his hip surgery.  However, about 1 week ago he did develop a little bit of a cough and he increased his Alvesco and used a short acting bronchodilator and he is better at this point.  He never had any fever or other respiratory tract symptoms and there was not really an obvious trigger giving rise to this very slight flare.  [name] is doing well while using a nasal steroid and some montelukast.  He has received 5 COVID vaccines and has received this year's flu vaccine.  Allergies as of 12/14/2020       Reactions   Penicillins Rash   Has patient had a PCN reaction causing immediate rash, facial/tongue/throat swelling, SOB or lightheadedness with hypotension: Yes Has patient had a PCN reaction causing severe rash involving mucus membranes or skin necrosis: Yes Has patient had a PCN reaction that required hospitalization: No Has patient had a PCN reaction occurring within the last 10 years: No If all of the above answers are "NO", then may proceed with Cephalosporin use. Rash at the injection site        Medication List    albuterol 108 (90 Base) MCG/ACT inhaler Commonly known as: VENTOLIN  HFA Inhale 2 puffs into the lungs every 6 (six) hours as needed for wheezing or shortness of breath.   Alvesco 160 MCG/ACT inhaler Generic drug: ciclesonide USE 1 INHALATION ORALLY TWICE DAILY TO PREVENT COUGH OR WHEEZE. INCREASE TO 2 INHALATIONS TWO TIMES A DAY WITH ASTHMA FLARE. RINSE, GARGLE, AND SPIT AFTER USE.   apixaban 5 MG Tabs tablet Commonly known as: ELIQUIS Take 1 tablet (5 mg total) by mouth 2 (two) times daily.   atorvastatin 20 MG tablet Commonly known as: LIPITOR Take 20 mg by mouth at bedtime.   Edarbyclor 40-12.5 MG Tabs Generic drug: Azilsartan-Chlorthalidone Take 1 tablet by mouth daily.   esomeprazole 40 MG capsule Commonly known as: NEXIUM Take 40 mg by mouth daily as needed (reflux).   fluticasone 50 MCG/ACT nasal spray Commonly known as: FLONASE Place 2 sprays into both nostrils daily.   HYDROcodone-acetaminophen 5-325 MG tablet Commonly known as: NORCO/VICODIN Take 1-2 tablets by mouth every 6 (six) hours as needed for severe pain.   Mega Multi Men Tabs Take 1 tablet by mouth once a week.   methocarbamol 500 MG tablet Commonly known as: ROBAXIN Take 1 tablet (500 mg total) by mouth every 6 (six) hours as needed for muscle spasms.   montelukast 10 MG tablet Commonly known as: SINGULAIR Take 10 mg by mouth daily as needed (allergies/ itching eye).   olopatadine 0.1 % ophthalmic solution Commonly known as: PATANOL Place 1  drop into both eyes daily.   polyethylene glycol 17 g packet Commonly known as: MIRALAX / GLYCOLAX Take 17 g by mouth daily as needed for mild constipation.   traMADol 50 MG tablet Commonly known as: ULTRAM Take 1-2 tablets (50-100 mg total) by mouth every 6 (six) hours as needed for moderate pain.   triamcinolone cream 0.1 % Commonly known as: KENALOG Apply 1 application topically 2 (two) times daily as needed (itching).    Past Medical History:  Diagnosis Date   Acute blood loss anemia    Allergic rhinitis     Arthritis    Constipation    Dyslipidemia    Dysrhythmia    history of paroxysmal A Fib and typical A Flutter    GERD (gastroesophageal reflux disease)    History of bronchitis    History of sick sinus syndrome    History of syncope    HLD (hyperlipidemia)    Hypertension    Hyponatremia    Leukocytosis    Moderate persistent asthma with acute exacerbation in adult    Presence of permanent cardiac pacemaker    Primary osteoarthritis of right hip    Sleep apnea    pt states not currently using CPAP machine    Unsteady gait     Past Surgical History:  Procedure Laterality Date   CARDIOVERSION N/A 04/28/2013   Procedure: CARDIOVERSION;  Surgeon: Dorothy Spark, MD;  Location: [facility];  Service: Cardiovascular;  Laterality: N/A;   HEMORRHOID SURGERY     left shoulder surgery      secondary to torn ligament / subluxation   TEE WITHOUT CARDIOVERSION N/A 04/28/2013   Procedure: TRANSESOPHAGEAL ECHOCARDIOGRAM (TEE);  Surgeon: Dorothy Spark, MD;  Location: [facility];  Service: Cardiovascular;  Laterality: N/A;   TOTAL HIP ARTHROPLASTY Right 03/08/2015   Procedure: RIGHT TOTAL HIP ARTHROPLASTY ANTERIOR APPROACH;  Surgeon: Gaynelle Arabian, MD;  Location: [facility];  Service: Orthopedics;  Laterality: Right;   TOTAL HIP ARTHROPLASTY Left 01/07/2020   Procedure: TOTAL HIP ARTHROPLASTY ANTERIOR APPROACH;  Surgeon: Gaynelle Arabian, MD;  Location: [facility]S;  Service: Orthopedics;  Laterality: Left;  166min    Review of systems negative except as noted in HPI / PMHx or noted below:  Review of Systems  Constitutional: Negative.   HENT: Negative.    Eyes: Negative.   Respiratory: Negative.    Cardiovascular: Negative.   Gastrointestinal: Negative.   Genitourinary: Negative.   Musculoskeletal: Negative.   Skin: Negative.   Neurological: Negative.   Endo/Heme/Allergies: Negative.   Psychiatric/Behavioral: Negative.      Objective:   Vitals:   12/14/20 1615  BP: 140/90  Pulse:  78  Resp: 18  Temp: (!) 97.2 F (36.2 C)  SpO2: 96%   Height: 5' 9.5" (176.5 cm)  Weight: 191 lb (86.6 kg)   Physical Exam Constitutional:      Appearance: He is not diaphoretic.  HENT:     Head: Normocephalic.     Right Ear: Tympanic membrane, ear canal and external ear normal.     Left Ear: Tympanic membrane, ear canal and external ear normal.     Murphy: Murphy normal. No mucosal edema or rhinorrhea.     Mouth/Throat:     Pharynx: Uvula midline. No oropharyngeal exudate.  Eyes:     Conjunctiva/sclera: Conjunctivae normal.  Neck:     Thyroid: No thyromegaly.     Trachea: Trachea normal. No tracheal tenderness or tracheal deviation.  Cardiovascular:     Rate and  Rhythm: Normal rate and regular rhythm.     Heart sounds: Normal heart sounds, S1 normal and S2 normal. No murmur heard. Pulmonary:     Effort: No respiratory distress.     Breath sounds: Normal breath sounds. No stridor. No wheezing or rales.  Lymphadenopathy:     Head:     Right side of head: No tonsillar adenopathy.     Left side of head: No tonsillar adenopathy.     Cervical: No cervical adenopathy.  Skin:    Findings: No erythema or rash.     Nails: There is no clubbing.  Neurological:     Mental Status: He is alert.    Diagnostics:    Spirometry was performed and demonstrated an FEV1 of 1.31 at 48 % of predicted.   Assessment and Plan:   1. Asthma, not well controlled, mild persistent, with acute exacerbation   2. Perennial allergic rhinitis     1.  Continue Alvesco 160 - 1 inhalations 1-2 times per day depending on disease activity  2.  Continue Flonase - 1 spray each nostril 1-2  times per day depending on disease activity  3.  Continue montelukast 10 mg - 1 tablet 1 time per day  4.  If needed:   A.  Albuterol HFA -2 inhalations every 4-6 hours  B.  OTC antihistamine  5. For this current episode:   A. Prednisone 10 mg - 1 tablet 1 time per day for 10 days only  6.  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  He has a good understanding of his disease state and how his medications work and appropriate dosing of his medications depending on disease activity.  He will continue to use an inhaled steroid and a nasal steroid and a leukotriene modifier on a consistent basis.  I will see him back in this clinic in 1 year or earlier if there is a problem.  Allena Katz, MD Allergy / Immunology [place]

## 2020-12-14 NOTE — Patient Instructions (Addendum)
  1.  Continue Alvesco 160 - 1 inhalations 1-2 times per day depending on disease activity  2.  Continue Flonase - 1 spray each nostril 1-2  times per day depending on disease activity  3.  Continue montelukast 10 mg - 1 tablet 1 time per day  4.  If needed:   A.  Albuterol HFA -2 inhalations every 4-6 hours  B.  OTC antihistamine  5. For this current episode:   A. Prednisone 10 mg - 1 tablet 1 time per day for 10 days only  6.  Return to clinic in 12 months or earlier if problem

## 2020-12-15 ENCOUNTER — Encounter: Payer: Self-pay | Admitting: Allergy and Immunology

## 2021-01-11 ENCOUNTER — Ambulatory Visit: Payer: Federal, State, Local not specified - PPO | Admitting: Allergy and Immunology

## 2021-02-01 ENCOUNTER — Ambulatory Visit (INDEPENDENT_AMBULATORY_CARE_PROVIDER_SITE_OTHER): Payer: Medicare Other

## 2021-02-01 DIAGNOSIS — I442 Atrioventricular block, complete: Secondary | ICD-10-CM

## 2021-02-02 LAB — CUP PACEART REMOTE DEVICE CHECK
Battery Remaining Longevity: 14 mo
Battery Voltage: 2.91 V
Brady Statistic AP VP Percent: 12.58 %
Brady Statistic AP VS Percent: 0 %
Brady Statistic AS VP Percent: 87.37 %
Brady Statistic AS VS Percent: 0.05 %
Brady Statistic RA Percent Paced: 9.46 %
Brady Statistic RV Percent Paced: 99.96 %
Date Time Interrogation Session: 20221228173053
Implantable Lead Implant Date: 20150413
Implantable Lead Implant Date: 20150413
Implantable Lead Location: 753859
Implantable Lead Location: 753860
Implantable Lead Model: 5076
Implantable Lead Model: 5076
Implantable Pulse Generator Implant Date: 20150413
Lead Channel Impedance Value: 361 Ohm
Lead Channel Impedance Value: 399 Ohm
Lead Channel Impedance Value: 437 Ohm
Lead Channel Impedance Value: 513 Ohm
Lead Channel Pacing Threshold Amplitude: 0.375 V
Lead Channel Pacing Threshold Amplitude: 1 V
Lead Channel Pacing Threshold Pulse Width: 0.4 ms
Lead Channel Pacing Threshold Pulse Width: 0.4 ms
Lead Channel Sensing Intrinsic Amplitude: 0.875 mV
Lead Channel Sensing Intrinsic Amplitude: 0.875 mV
Lead Channel Sensing Intrinsic Amplitude: 8.5 mV
Lead Channel Sensing Intrinsic Amplitude: 8.5 mV
Lead Channel Setting Pacing Amplitude: 1.5 V
Lead Channel Setting Pacing Amplitude: 2.5 V
Lead Channel Setting Pacing Pulse Width: 0.4 ms
Lead Channel Setting Sensing Sensitivity: 0.9 mV

## 2021-02-10 NOTE — Progress Notes (Signed)
Remote pacemaker transmission.   

## 2021-05-03 ENCOUNTER — Ambulatory Visit (INDEPENDENT_AMBULATORY_CARE_PROVIDER_SITE_OTHER): Payer: Medicare Other

## 2021-05-03 DIAGNOSIS — I442 Atrioventricular block, complete: Secondary | ICD-10-CM

## 2021-05-04 LAB — CUP PACEART REMOTE DEVICE CHECK
Battery Remaining Longevity: 11 mo
Battery Voltage: 2.89 V
Brady Statistic RA Percent Paced: 0.17 %
Brady Statistic RV Percent Paced: 99.96 %
Date Time Interrogation Session: 20230328211007
Implantable Lead Implant Date: 20150413
Implantable Lead Implant Date: 20150413
Implantable Lead Location: 753859
Implantable Lead Location: 753860
Implantable Lead Model: 5076
Implantable Lead Model: 5076
Implantable Pulse Generator Implant Date: 20150413
Lead Channel Impedance Value: 361 Ohm
Lead Channel Impedance Value: 399 Ohm
Lead Channel Impedance Value: 418 Ohm
Lead Channel Impedance Value: 475 Ohm
Lead Channel Pacing Threshold Amplitude: 0.375 V
Lead Channel Pacing Threshold Amplitude: 1 V
Lead Channel Pacing Threshold Pulse Width: 0.4 ms
Lead Channel Pacing Threshold Pulse Width: 0.4 ms
Lead Channel Sensing Intrinsic Amplitude: 1.125 mV
Lead Channel Sensing Intrinsic Amplitude: 1.125 mV
Lead Channel Sensing Intrinsic Amplitude: 22.5 mV
Lead Channel Sensing Intrinsic Amplitude: 22.5 mV
Lead Channel Setting Pacing Amplitude: 1.5 V
Lead Channel Setting Pacing Amplitude: 2.5 V
Lead Channel Setting Pacing Pulse Width: 0.4 ms
Lead Channel Setting Sensing Sensitivity: 0.9 mV

## 2021-05-16 NOTE — Progress Notes (Signed)
Remote pacemaker transmission.   

## 2021-06-02 ENCOUNTER — Encounter: Payer: Self-pay | Admitting: *Deleted

## 2021-06-06 ENCOUNTER — Ambulatory Visit (INDEPENDENT_AMBULATORY_CARE_PROVIDER_SITE_OTHER): Payer: Medicare Other | Admitting: Neurology

## 2021-06-06 ENCOUNTER — Encounter: Payer: Self-pay | Admitting: Neurology

## 2021-06-06 VITALS — BP 175/98 | HR 91 | Ht 69.5 in | Wt 180.4 lb

## 2021-06-06 DIAGNOSIS — I480 Paroxysmal atrial fibrillation: Secondary | ICD-10-CM

## 2021-06-06 DIAGNOSIS — G4733 Obstructive sleep apnea (adult) (pediatric): Secondary | ICD-10-CM | POA: Diagnosis not present

## 2021-06-06 DIAGNOSIS — Z950 Presence of cardiac pacemaker: Secondary | ICD-10-CM | POA: Diagnosis not present

## 2021-06-06 DIAGNOSIS — R351 Nocturia: Secondary | ICD-10-CM

## 2021-06-06 DIAGNOSIS — I495 Sick sinus syndrome: Secondary | ICD-10-CM | POA: Diagnosis not present

## 2021-06-06 DIAGNOSIS — G4719 Other hypersomnia: Secondary | ICD-10-CM

## 2021-06-06 NOTE — Progress Notes (Addendum)
Subjective:    Patient ID: Isaiah Murphy is a 87 y.o. male.  HPI    Isaiah Foley, MD, PhD [facility] [address] [address] [place], [place] [address]  Dear Dr. Timothy Murphy,   I saw your patient, Isaiah Murphy, upon your kind request, in my sleep clinic today for initial consultation of his sleep disorder, in particular, evaluation of his prior diagnosis of obstructive sleep apnea.  The patient is unaccompanied today.  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  Prior sleep study results are not available for my review today, [place], as per his recollection, study was at least 3 years ago, when he was residing in [place].  He moved to [place] in 2020.  He does not have a current DME company.  He has not really used his CPAP consistently but admits that it was helpful and helped him feel better rested and more energized during the day.  He is motivated to get back on track with his treatment.  He does not recall if he had mild to moderate or severe sleep apnea at the time.  His weight has been more or less stable, bedtime generally between 10 and 11 PM and rise time around 7 AM.  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  He then moved to [place] and worked for the [employer].  He lives with his wife, they have 2 grown children, younger daughter lives in [place] and older  daughter lives in [place], she is Catering manager of operations and special events with the [employer].  [place].  I reviewed your office note from 2/32023.  His Epworth sleepiness score is 14 out of 24, fatigue severity score is 52 out of 63.  He has nocturia about twice per average night, denies recurrent morning headaches.  He is not aware of any family history of sleep apnea.  He is a non-smoker and drinks alcohol very occasionally.  I reviewed compliance data, in the past month he used his machine once.  He admits that he has not been using his machine and just used it here and there.  He was affected by the [employer] recall and received a replacement unit about 6 months ago as he recalls.  He does not have a TV in his bedroom, no pets in the household.  His Past Medical History Is Significant For: Past Medical History:  Diagnosis Date   Acute blood loss anemia    Allergic rhinitis    Arthritis    Asthma    Chronic anticoagulation    Constipation    Dyslipidemia    Dysrhythmia    history of paroxysmal A Fib and typical A Flutter    GERD (gastroesophageal reflux disease)    History of bronchitis    History of sick sinus syndrome    History of syncope    HLD (hyperlipidemia)    Hx of syncope    Hypertension    Hyponatremia  Leukocytosis    Moderate persistent asthma with acute exacerbation in adult    Osteoarthritis    s/p R & L THR   Presence of permanent cardiac pacemaker    Primary osteoarthritis of right hip    Sleep apnea    pt states not currently using CPAP machine    Unsteady gait     His Past Surgical History Is Significant For: Past Surgical History:  Procedure Laterality Date   CARDIOVERSION N/A 04/28/2013   Procedure: CARDIOVERSION;  Surgeon: Lars Masson, MD;  Location: [facility] ENDOSCOPY;  Service: Cardiovascular;  Laterality: N/A;   HEMORRHOID SURGERY     left shoulder surgery      secondary to torn ligament / subluxation   TEE WITHOUT  CARDIOVERSION N/A 04/28/2013   Procedure: TRANSESOPHAGEAL ECHOCARDIOGRAM (TEE);  Surgeon: Lars Masson, MD;  Location: [facility] ENDOSCOPY;  Service: Cardiovascular;  Laterality: N/A;   TOTAL HIP ARTHROPLASTY Right 03/08/2015   Procedure: RIGHT TOTAL HIP ARTHROPLASTY ANTERIOR APPROACH;  Surgeon: Ollen Gross, MD;  Location: [facility] ORS;  Service: Orthopedics;  Laterality: Right;   TOTAL HIP ARTHROPLASTY Left 01/07/2020   Procedure: TOTAL HIP ARTHROPLASTY ANTERIOR APPROACH;  Surgeon: Ollen Gross, MD;  Location: [facility] ORS;  Service: Orthopedics;  Laterality: Left;     His Family History Is Significant For: Family History  Problem Relation Age of Onset   Hypertension Father    Allergic rhinitis Neg Hx    Asthma Neg Hx    Eczema Neg Hx    Immunodeficiency Neg Hx    Urticaria Neg Hx     His Social History Is Significant For: Social History   Socioeconomic History   Marital status: Married    Spouse name: Not on file   Number of children: Not on file   Years of education: Not on file   Highest education level: Not on file  Occupational History   Not on file  Tobacco Use   Smoking status: Never   Smokeless tobacco: Never  Vaping Use   Vaping Use: Never used  Substance and Sexual Activity   Alcohol use: Yes    Alcohol/week: 0.0 standard drinks    Comment: socially    Drug use: No   Sexual activity: Not on file  Other Topics Concern   Not on file  Social History Narrative   ** Merged History Encounter **       Married, Scientist, research (medical) in [place]   Social Determinants of Corporate investment banker Strain: Not on Ship broker Insecurity: Not on file  Transportation Needs: Not on file  Physical Activity: Not on file  Stress: Not on file  Social Connections: Not on file    His Allergies Are:  Allergies  Allergen Reactions   Penicillins Rash    Has patient had a PCN reaction causing immediate rash, facial/tongue/throat swelling, SOB or lightheadedness  with hypotension: Yes Has patient had a PCN reaction causing severe rash involving mucus membranes or skin necrosis: Yes Has patient had a PCN reaction that required hospitalization: No Has patient had a PCN reaction occurring within the last 10 years: No If all of the above answers are "NO", then may proceed with Cephalosporin use. Rash at the injection site  :   His Current Medications Are:  Outpatient Encounter Medications as of 06/06/2021  Medication Sig   albuterol (VENTOLIN HFA) 108 (90 Base) MCG/ACT inhaler Inhale 2 puffs into the lungs every 6 (six) hours as needed for wheezing  or shortness of breath.   apixaban (ELIQUIS) 5 MG TABS tablet Take 1 tablet (5 mg total) by mouth 2 (two) times daily.   atorvastatin (LIPITOR) 20 MG tablet Take 20 mg by mouth at bedtime.    Azilsartan-Chlorthalidone (EDARBYCLOR) 40-12.5 MG TABS Take 1 tablet by mouth daily.   ciclesonide (ALVESCO) 160 MCG/ACT inhaler USE 1 INHALATION ORALLY TWICE DAILY TO PREVENT COUGH OR WHEEZE. INCREASE TO 2 INHALATIONS TWO TIMES A DAY WITH ASTHMA FLARE. RINSE, GARGLE, AND SPIT AFTER USE.   esomeprazole (NEXIUM) 40 MG capsule Take 40 mg by mouth daily as needed (reflux).   fluticasone (FLONASE) 50 MCG/ACT nasal spray Place 2 sprays into both nostrils daily.   ketorolac (ACULAR) 0.5 % ophthalmic solution 1 drop. Affected eye as needed   montelukast (SINGULAIR) 10 MG tablet Take 10 mg by mouth daily as needed (allergies/ itching eye).    Multiple Vitamins-Minerals (MEGA MULTI MEN) TABS Take 1 tablet by mouth once a week.   olopatadine (PATANOL) 0.1 % ophthalmic solution Place 1 drop into both eyes daily.    triamcinolone (KENALOG) 0.1 % Apply 1 application topically 2 (two) times daily as needed (itching).   [DISCONTINUED] HYDROcodone-acetaminophen (NORCO/VICODIN) 5-325 MG tablet Take 1-2 tablets by mouth every 6 (six) hours as needed for severe pain. (Patient not taking: Reported on 06/06/2021)   [DISCONTINUED] methocarbamol  (ROBAXIN) 500 MG tablet Take 1 tablet (500 mg total) by mouth every 6 (six) hours as needed for muscle spasms. (Patient not taking: Reported on 06/06/2021)   [DISCONTINUED] polyethylene glycol (MIRALAX / GLYCOLAX) packet Take 17 g by mouth daily as needed for mild constipation. (Patient not taking: Reported on 06/06/2021)   [DISCONTINUED] traMADol (ULTRAM) 50 MG tablet Take 1-2 tablets (50-100 mg total) by mouth every 6 (six) hours as needed for moderate pain. (Patient not taking: Reported on 06/06/2021)   No facility-administered encounter medications on file as of 06/06/2021.  :   Review of Systems:  Out of a complete 14 point review of systems, all are reviewed and negative with the exception of these symptoms as listed below:  Review of Systems  Neurological:        Reestablish locally to [place], [place] had sleep study over 5 yrs.  Brought respironic machine will try to get DL. (Id have machine [employer] and did have sd card).  DL, but not much information. FSS-.     Objective:  Neurological Exam  Physical Exam Physical Examination:   Vitals:   06/06/21 1258  BP: (!) 175/98  Pulse: 91    General Examination: The patient is a very pleasant 88 y.o. male in no acute distress. He appears well-developed and well-nourished and well groomed.   HEENT: Normocephalic, atraumatic, pupils are equal, round and reactive to light, extraocular tracking is good without limitation to gaze excursion or nystagmus noted. Hearing is grossly intact. Face is symmetric with normal facial animation. Speech is clear with no dysarthria noted. There is no hypophonia. There is no lip, neck/head, jaw or voice tremor. Neck is supple with full range of passive and active motion. There are no carotid bruits on auscultation. Oropharynx exam reveals: mild mouth dryness, adequate dental hygiene and moderate airway crowding, due to small airway entry, redundant soft palate, tonsils on the smaller side, neck circumference 17 inches,  minimal overbite.  Tongue protrudes centrally and palate elevates symmetrically.  Chest: Clear to auscultation without wheezing, rhonchi or crackles noted.  Heart: S1+S2+0, regular and normal without murmurs, rubs or gallops noted.  Abdomen: Soft, non-tender and non-distended.  Extremities: There is no pitting edema in the distal lower extremities bilaterally.   Skin: Warm and dry without trophic changes noted.   Musculoskeletal: exam reveals no obvious joint deformities.  He is status post bilateral hip replacements.  Neurologically:  Mental status: The patient is awake, alert and oriented in all 4 spheres. His immediate and remote memory, attention, language skills and fund of knowledge are appropriate. There is no evidence of aphasia, agnosia, apraxia or anomia. Speech is clear with normal prosody and enunciation. Thought process is linear. Mood is normal and affect is normal.  Cranial nerves II - XII are as described above under HEENT exam.  Motor exam: Normal bulk, strength and tone is noted. There is no obvious tremor. Fine motor skills and coordination: grossly intact.  Cerebellar testing: No dysmetria or intention tremor. There is no truncal or gait ataxia.  Sensory exam: intact to light touch in the upper and lower extremities.  Gait, station and balance: He stands easily. No veering to one side is noted. No leaning to one side is noted. Posture is age-appropriate and stance is narrow based. Gait shows normal stride length and normal pace. No problems turning are noted.   Assessment and Plan:  In summary, Isaiah Murphy is a very pleasant 87 y.o.-year old male with an underlying complex medical history of hypertension, hyperlipidemia, paroxysmal A-fib with status post cardioversion, sick sinus syndrome, history of syncope, status post pacemaker placement, asthma, allergic rhinitis, chronic constipation, reflux disease, osteoarthritis with status post bilateral hip replacements, and  mildly overweight state, who presents for evaluation of his prior diagnosis of obstructive sleep apnea.  I had a long chat with the patient about my findings and the diagnosis of OSA, its prognosis and treatment options. We talked about medical treatments, surgical interventions and non-pharmacological approaches. I explained in particular the risks and ramifications of untreated moderate to severe OSA, especially with respect to developing cardiovascular disease down the Road, including congestive heart failure, difficult to treat hypertension, cardiac arrhythmias, or stroke. Even type 2 diabetes has, in part, been linked to untreated OSA. Symptoms of untreated OSA include daytime sleepiness, memory problems, mood irritability and mood disorder such as depression and anxiety, lack of energy, as well as recurrent headaches, especially morning headaches. We talked about trying to maintain a healthy lifestyle in general, as well as the importance of weight control. We also talked about the importance of good sleep hygiene. I recommended the following at this time: sleep study.  I outlined the differences between a laboratory attended sleep study versus home sleep testing. I explained the sleep test procedure to the patient and also outlined possible surgical and non-surgical treatment options of OSA, including the use of a custom-made dental device (which would require a referral to a specialist dentist or oral surgeon), upper airway surgical options, such as traditional UPPP or a novel less invasive surgical option in the form of Inspire hypoglossal nerve stimulation (which would involve a referral to an ENT surgeon). I also explained the CPAP treatment option to the patient, who indicated that he would be willing to get back on PAP therapy.  I explained the importance of being compliant with PAP treatment, not only for insurance purposes but primarily to improve His symptoms, and for the patient's long term health  benefit, including to reduce His cardiovascular risks. I answered all his questions today and the patient and  was in agreement. I plan to see him back  after the sleep study is completed and encouraged him to call with any interim questions, concerns, problems or updates.   Thank you very much for allowing me to participate in the care of this nice patient. If I can be of any further assistance to you please do not hesitate to call me at [phone].  Sincerely,   Isaiah Foley, MD, PhD

## 2021-06-06 NOTE — Patient Instructions (Signed)
Thank you for choosing [facility] for your sleep related care! ?It was nice to meet you today! I appreciate that you entrust me with your sleep related healthcare concerns. I hope, I was able to address at least some of your concerns today, and that I can help you feel reassured and also get better.   ? ?Here is what we discussed today and what we came up with as our plan for you:  ?  ?Based on your symptoms and your exam I believe you are still at risk for obstructive sleep apnea and would benefit from re-evaluation as it has been several years and you benefited from treatment in the past. You may be eligible for a new machine and new supplies and we should try to get you established with a local durable medical equipment company as well.   ? ?Therefore, I think we should proceed with a sleep study to determine how severe your sleep apnea is. If you have more than mild OSA, I want you to consider ongoing treatment with CPAP. Please remember, the risks and ramifications of moderate to severe obstructive sleep apnea or OSA are: Cardiovascular disease, including congestive heart failure, stroke, difficult to control hypertension, arrhythmias, and even type 2 diabetes has been linked to untreated OSA. Sleep apnea causes disruption of sleep and sleep deprivation in most cases, which, in turn, can cause recurrent headaches, problems with memory, mood, concentration, focus, and vigilance. Most people with untreated sleep apnea report excessive daytime sleepiness, which can affect their ability to drive. Please do not drive if you feel sleepy.  ? ?I will likely see you back after your sleep study to go over the test results and where to go from there. We will call you after your sleep study to advise about the results (most likely, you will hear from [place], my nurse) and to set up an appointment at the time, as necessary.   ? ?Our sleep lab administrative assistant will call you to schedule your sleep  study. If you don't hear back from her by about 2 weeks from now, please feel free to call her at [phone]. You can leave a message with your phone number and concerns, if you get the voicemail box. She will call back as soon as possible.  ? ? ?

## 2021-06-12 ENCOUNTER — Other Ambulatory Visit: Payer: Self-pay | Admitting: Allergy and Immunology

## 2021-06-14 ENCOUNTER — Ambulatory Visit (INDEPENDENT_AMBULATORY_CARE_PROVIDER_SITE_OTHER): Payer: Medicare Other | Admitting: Neurology

## 2021-06-14 DIAGNOSIS — G4761 Periodic limb movement disorder: Secondary | ICD-10-CM

## 2021-06-14 DIAGNOSIS — G4733 Obstructive sleep apnea (adult) (pediatric): Secondary | ICD-10-CM

## 2021-06-14 DIAGNOSIS — R063 Periodic breathing: Secondary | ICD-10-CM

## 2021-06-14 DIAGNOSIS — R351 Nocturia: Secondary | ICD-10-CM

## 2021-06-14 DIAGNOSIS — G4720 Circadian rhythm sleep disorder, unspecified type: Secondary | ICD-10-CM

## 2021-06-14 DIAGNOSIS — I480 Paroxysmal atrial fibrillation: Secondary | ICD-10-CM

## 2021-06-14 DIAGNOSIS — G4719 Other hypersomnia: Secondary | ICD-10-CM

## 2021-06-14 DIAGNOSIS — G4731 Primary central sleep apnea: Secondary | ICD-10-CM

## 2021-06-14 DIAGNOSIS — Z950 Presence of cardiac pacemaker: Secondary | ICD-10-CM

## 2021-06-14 DIAGNOSIS — I495 Sick sinus syndrome: Secondary | ICD-10-CM

## 2021-06-20 ENCOUNTER — Telehealth: Payer: Self-pay | Admitting: *Deleted

## 2021-06-20 NOTE — Telephone Encounter (Signed)
Pt brought SD card to office today from old CPAP machine to get patients Pressures from machine. Not able to get any information off  of old SD card from old CPAP machine he returned  Dr.Athar aware  ?

## 2021-06-21 ENCOUNTER — Ambulatory Visit (INDEPENDENT_AMBULATORY_CARE_PROVIDER_SITE_OTHER): Payer: Medicare Other | Admitting: Allergy and Immunology

## 2021-06-21 ENCOUNTER — Encounter: Payer: Self-pay | Admitting: Allergy and Immunology

## 2021-06-21 VITALS — BP 148/92 | HR 81 | Temp 97.2°F | Resp 20 | Ht 69.5 in | Wt 183.2 lb

## 2021-06-21 DIAGNOSIS — J3089 Other allergic rhinitis: Secondary | ICD-10-CM | POA: Diagnosis not present

## 2021-06-21 DIAGNOSIS — J4531 Mild persistent asthma with (acute) exacerbation: Secondary | ICD-10-CM

## 2021-06-21 MED ORDER — FLUTICASONE PROPIONATE 50 MCG/ACT NA SUSP: 1.0000 | Freq: Two times a day (BID) | NASAL | 11 refills | Status: DC | PRN

## 2021-06-21 MED ORDER — ALVESCO 160 MCG/ACT IN AERS: INHALATION_SPRAY | RESPIRATORY_TRACT | 4 refills | Status: DC

## 2021-06-21 MED ORDER — DM-GUAIFENESIN ER 30-600 MG PO TB12: 1.0000 | ORAL_TABLET | Freq: Two times a day (BID) | ORAL | 3 refills | Status: DC

## 2021-06-21 MED ORDER — MONTELUKAST SODIUM 10 MG PO TABS: 10.0000 mg | ORAL_TABLET | Freq: Every evening | ORAL | 11 refills | Status: DC

## 2021-06-21 MED ORDER — LEVOCETIRIZINE DIHYDROCHLORIDE 5 MG PO TABS: 5.0000 mg | ORAL_TABLET | Freq: Two times a day (BID) | ORAL | 11 refills | Status: DC | PRN

## 2021-06-21 MED ORDER — ALBUTEROL SULFATE HFA 108 (90 BASE) MCG/ACT IN AERS: 2.0000 | INHALATION_SPRAY | Freq: Four times a day (QID) | RESPIRATORY_TRACT | 2 refills | Status: DC | PRN

## 2021-06-21 NOTE — Progress Notes (Signed)
? ?[place] - [employer] - [place] - [place] - [place] ? ? ?Follow-up Note ? ?Referring Provider: Creola Corn, MD ?Primary Provider: Creola Corn, MD ?Date of Office Visit: 06/21/2021 ? ?Subjective:  ? ?Isaiah Murphy (DOB: 07-Jan-1935) is a 87 y.o. male who [facility] in re-evaluation of the following: ? 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  His last visit to this clinic was 14 December 2020. ? ?He was really doing very well regarding his asthma and did not need to use a short acting bronchodilator and was physically active without any difficulty above and beyond his musculoskeletal issues while using a relatively low-dose of Alvesco.  Unfortunately, about 2 and half days ago he developed acute onset of cough and some chest tightness and some shortness of breath and he can not really exert himself very much.  He does not have any chest pain or sputum production or significant upper airway symptoms and he has had no leg swelling.  He did increase his Alvesco to 2 inhalations twice a day when he became sick. ? ?Overall his nose was really doing very well prior to this event while using montelukast and he also added Claritin and continues on a nasal steroid. ? ?He has not required a systemic steroid or an antibiotic for any type of airway issues since his last visit. ? ?He has not been using his CPAP machine for his sleep apnea and he has had reevaluation with neurology recently and will be restarting his CPAP machine at some point in the future. ? ?Allergies as of 06/21/2021   ? ?   Reactions  ? Penicillins Rash  ? Has patient had a PCN reaction causing immediate rash, facial/tongue/throat swelling, SOB or lightheadedness with hypotension: Yes ?Has patient had a PCN reaction causing severe rash involving mucus membranes or skin necrosis: Yes ?Has patient had a PCN reaction that required hospitalization: No ?Has patient  had a PCN reaction occurring within the last 10 years: No ?If all of the above answers are "NO", then may proceed with Cephalosporin use. ?Rash at the injection site  ? ?  ? ?  ?Medication List  ? ? ?albuterol 108 (90 Base) MCG/ACT inhaler ?Commonly known as: VENTOLIN HFA ?Inhale 2 puffs into the lungs every 6 (six) hours as needed for wheezing or shortness of breath. ?  ?Alvesco 160 MCG/ACT inhaler ?Generic drug: ciclesonide ?USE 1 INHALATION ORALLY TWICE DAILY TO PREVENT COUGH OR WHEEZE. INCREASE TO USE 2 INHALATIONS ORALLY TWICE DAILY WITH ASTHMA FLARE. RINSE, GARGLE AND SPIT AFTER USE. ?  ?apixaban 5 MG Tabs tablet ?Commonly known as: ELIQUIS ?Take 1 tablet (5 mg total) by mouth 2 (two) times daily. ?  ?atorvastatin 20 MG tablet ?Commonly known as: LIPITOR ?Take 20 mg by mouth at bedtime. ?  ?esomeprazole 40 MG capsule ?Commonly known as: NEXIUM ?Take 40 mg by mouth daily as needed (reflux). ?  ?fluticasone 50 MCG/ACT nasal spray ?Commonly known as: FLONASE ?Place 2 sprays into both nostrils daily. ?  ?ketorolac 0.5 % ophthalmic solution ?Commonly known as: ACULAR ?1 drop. Affected eye as needed ?  ?Mega Multi Men Tabs ?Take 1 tablet by mouth once a week. ?  ?montelukast 10 MG tablet ?Commonly known as: SINGULAIR ?Take 10 mg by mouth daily as needed (allergies/ itching eye). ?  ?olopatadine 0.1 % ophthalmic solution ?Commonly known as: PATANOL ?Place 1 drop into both eyes daily. ?  ?  triamcinolone cream 0.1 % ?Commonly known as: KENALOG ?Apply 1 application topically 2 (two) times daily as needed (itching). ?  ? ?Past Medical History:  ?Diagnosis Date  ? Acute blood loss anemia   ? Allergic rhinitis   ? Arthritis   ? Asthma   ? Chronic anticoagulation   ? Constipation   ? Dyslipidemia   ? Dysrhythmia   ? history of paroxysmal A Fib and typical A Flutter   ? GERD (gastroesophageal reflux disease)   ? History of bronchitis   ? History of sick sinus syndrome   ? History of syncope   ? HLD (hyperlipidemia)   ? Hx of  syncope   ? Hypertension   ? Hyponatremia   ? Leukocytosis   ? Moderate persistent asthma with acute exacerbation in adult   ? Osteoarthritis   ? s/p R & L THR  ? Presence of permanent cardiac pacemaker   ? Primary osteoarthritis of right hip   ? Sleep apnea   ? pt states not currently using CPAP machine   ? Unsteady gait   ? ? ?Past Surgical History:  ?Procedure Laterality Date  ? CARDIOVERSION N/A 04/28/2013  ? Procedure: CARDIOVERSION;  Surgeon: Lars MassonKatarina H Nelson, MD;  [facility];  Service: Cardiovascular;  Laterality: N/A;  ? HEMORRHOID SURGERY    ? left shoulder surgery     ? secondary to torn ligament / subluxation  ? TEE WITHOUT CARDIOVERSION N/A 04/28/2013  ? Procedure: TRANSESOPHAGEAL ECHOCARDIOGRAM (TEE);  Surgeon: Lars MassonKatarina H Nelson, MD;  [facility];  Service: Cardiovascular;  Laterality: N/A;  ? TOTAL HIP ARTHROPLASTY Right 03/08/2015  ? Procedure: RIGHT TOTAL HIP ARTHROPLASTY ANTERIOR APPROACH;  Surgeon: Ollen GrossFrank Aluisio, MD;  Location: [facility] ORS;  Service: Orthopedics;  Laterality: Right;  ? TOTAL HIP ARTHROPLASTY Left 01/07/2020  ? Procedure: TOTAL HIP ARTHROPLASTY ANTERIOR APPROACH;  Surgeon: Ollen GrossAluisio, Frank, MD;  Location: [facility] OR[facility];  Service: Orthopedics;  Laterality: Left;  100min  ? ? ?Review of systems negative except as noted in HPI / PMHx or noted below: ? ?Review of Systems  ?Constitutional: Negative.   ?HENT: Negative.    ?Eyes: Negative.   ?Respiratory: Negative.    ?Cardiovascular: Negative.   ?Gastrointestinal: Negative.   ?Genitourinary: Negative.   ?Musculoskeletal: Negative.   ?Skin: Negative.   ?Neurological: Negative.   ?Endo/Heme/Allergies: Negative.   ?Psychiatric/Behavioral: Negative.    ? ? ?Objective:  ? ?Vitals:  ? 06/21/21 1132  ?BP: (!) 148/92  ?Pulse: 81  ?Resp: 20  ?Temp: (!) 97.2 ?F (36.2 ?C)  ?SpO2: 97%  ? ?Height: 5' 9.5" (176.5 cm)  ?Weight: 183 lb 3.2 oz (83.1 kg)  ? ?Physical Exam ?Constitutional:   ?   Appearance: He is not diaphoretic.  ?HENT:  ?   Head:  Normocephalic.  ?   Right Ear: Tympanic membrane, ear canal and external ear normal.  ?   Left Ear: Tympanic membrane, ear canal and external ear normal.  ?   Nose: Nose normal. No mucosal edema or rhinorrhea.  ?   Mouth/Throat:  ?   Pharynx: Uvula midline. No oropharyngeal exudate.  ?Eyes:  ?   Conjunctiva/sclera: Conjunctivae normal.  ?Neck:  ?   Thyroid: No thyromegaly.  ?   Trachea: Trachea normal. No tracheal tenderness or tracheal deviation.  ?Cardiovascular:  ?   Rate and Rhythm: Normal rate and regular rhythm.  ?   Heart sounds: Normal heart sounds, S1 normal and S2 normal. No murmur heard. ?Pulmonary:  ?   Effort:  No respiratory distress.  ?   Breath sounds: Normal breath sounds. No stridor. No wheezing or rales.  ?Lymphadenopathy:  ?   Head:  ?   Right side of head: No tonsillar adenopathy.  ?   Left side of head: No tonsillar adenopathy.  ?   Cervical: No cervical adenopathy.  ?Skin: ?   Findings: No erythema or rash.  ?   Nails: There is no clubbing.  ?Neurological:  ?   Mental Status: He is alert.  ? ? ?Diagnostics:  ?  ?Spirometry was performed and demonstrated an FEV1 of 1.21 at 54 % of predicted. ? ?Assessment and Plan:  ? ?1. Asthma, not well controlled, mild persistent, with acute exacerbation   ?2. Perennial allergic rhinitis   ? ? ?1.  Continue Alvesco 160 - 1-2 inhalations 1-2 times per day depending on disease activity (MAX = 2 inhalations 2 times per day) ? ?2.  Continue Flonase - 1 spray each nostril 1-2  times per day depending on disease activity ? ?3.  Continue montelukast 10 mg - 1 tablet 1 time per day ? ?4.  If needed: ? ? A.  Albuterol HFA -2 inhalations every 4-6 hours ? B.  OTC antihistamine ? ?5. For this current episode: ? ? A. Prednisone 10 mg - 1 tablet 1 time per day for 10 days only ? B. Mucinex DM - 1-2 times per day ? C. Further evaluation??? ? ?6.  Return to clinic in 12 months or earlier if problem ? 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

## 2021-06-21 NOTE — Patient Instructions (Addendum)
?  1.  Continue Alvesco 160 - 1-2 inhalations 1-2 times per day depending on disease activity (MAX = 2 inhalations 2 times per day) ? ?2.  Continue Flonase - 1 spray each nostril 1-2  times per day depending on disease activity ? ?3.  Continue montelukast 10 mg - 1 tablet 1 time per day ? ?4.  If needed: ? ? A.  Albuterol HFA -2 inhalations every 4-6 hours ? B.  OTC antihistamine ? ?5. For this current episode: ? ? A. Prednisone 10 mg - 1 tablet 1 time per day for 10 days only ? B. Mucinex DM - 1-2 times per day ? C. Further evaluation??? ? ?6.  Return to clinic in 12 months or earlier if problem ?

## 2021-06-22 ENCOUNTER — Encounter: Payer: Self-pay | Admitting: Allergy and Immunology

## 2021-06-27 NOTE — Procedures (Signed)
PATIENT'S NAME:  Isaiah Murphy, Deman DOB:      May 24, 1934      MR#:    [id number]     DATE OF RECORDING: 06/14/2021 REFERRING M.D.:  Shon Baton, MD Study Performed:   Baseline Polysomnogram HISTORY: 87 year old gentleman with a history of hypertension, hyperlipidemia, paroxysmal A-fib with status post cardioversion, sick sinus syndrome, history of syncope, status post pacemaker placement, asthma, allergic rhinitis, chronic constipation, reflux disease, osteoarthritis with status post bilateral hip replacements, and mildly overweight state, who was previously diagnosed with obstructive sleep apnea and placed on PAP therapy. He has not used his CPAP consistently in the recent past. He presents for re-evaluation. The patient endorsed the Epworth Sleepiness Scale at 14 points. The patient's weight 180 pounds with a height of 69 (inches), resulting in a BMI of 26.8 kg/m2. The patient's neck circumference measured 17 inches.  CURRENT MEDICATIONS: Ventolin HFA, Eliquis, Lipitor, Edarbyclor, Alvesco, Nexium, Flonase, Acular, Singulair, Mega Multi Men, Patanol, Kenalog   PROCEDURE:  This is a multichannel digital polysomnogram utilizing the Somnostar 11.2 system.  Electrodes and sensors were applied and monitored per AASM Specifications.   EEG, EOG, Chin and Limb EMG, were sampled at 200 Hz.  ECG, Snore and Nasal Pressure, Thermal Airflow, Respiratory Effort, CPAP Flow and Pressure, Oximetry was sampled at 50 Hz. Digital video and audio were recorded.      BASELINE STUDY  Lights Out was at 21:52 and Lights On at 04:59.  Total recording time (TRT) was 427.5 minutes, with a total sleep time (TST) of 251.5 minutes.   The patient's sleep latency was 14.5 minutes.  REM latency was 327 minutes, which is markedly delayed.  The sleep efficiency was 58.8 %.     SLEEP ARCHITECTURE: WASO (Wake after sleep onset) was 158.5 minutes with a longer period of wakefulness in the middle to the night and overall moderate sleep  fragmentation noted. There were 19 minutes in Stage N1, 196.5 minutes Stage N2, 0 minutes Stage N3 and 36 minutes in Stage REM.  The percentage of Stage N1 was 7.6%, Stage N2 was 78.1%, which is markedly increased, Stage N3 was absent, and Stage R (REM sleep) was 14.3%, which is reduced. The arousals were noted as: 45 were spontaneous, 4 were associated with PLMs, 25 were associated with respiratory events.  RESPIRATORY ANALYSIS:  There were a total of 211 respiratory events:  1 obstructive apneas, 190 central apneas and 0 mixed apneas with a total of 191 apneas and an apnea index (AI) of 45.6 /hour. There were 20 hypopneas with a hypopnea index of 4.8 /hour. The patient also had 0 respiratory event related arousals (RERAs).      The total APNEA/HYPOPNEA INDEX (AHI) was 50.3/hour and the total RESPIRATORY DISTURBANCE INDEX was  50.3 /hour.  27 events occurred in REM sleep and 194 events in NREM. The REM AHI was  45 /hour, versus a non-REM AHI of 51.2. The patient spent 0 minutes of total sleep time in the supine position and 252 minutes in non-supine.. The supine AHI was n/a versus a non-supine AHI of 50.4.  OXYGEN SATURATION & C02:  The Wake baseline 02 saturation was 94%, with the lowest being 86%. Time spent below 89% saturation equaled 8 minutes.  PERIODIC LIMB MOVEMENTS: The patient had a total of 94 Periodic Limb Movements.  The Periodic Limb Movement (PLM) index was 22.4 and the PLM Arousal index was 1./hour.  Audio and video analysis did not show any abnormal or unusual movements, behaviors, phonations  or vocalizations. The patient took no bathroom breaks. Mild snoring was noted. The EKG regular with pacer artifact noted.   Post-study, the patient indicated that sleep was the same as usual.   IMPRESSION:  Primary Central Sleep Apnea  Periodic Limb Movement Disorder (PLMD) Dysfunctions associated with sleep stages or arousal from sleep  RECOMMENDATIONS:  This study demonstrates severe  primary central sleep apnea with an AHI of 50.3/hour and O2 nadir of 86%, with almost exclusively central respiratory events. Treatment with positive airway pressure is recommended, and likely will require BiPAP or BiPAP ST or ASV. Titration will require a full night titration study to optimize therapy settings, mask fit, monitoring of tolerance and of proper oxygen saturations. Other treatment options are limited due to the primary central nature of his sleep disordered breathing.  This study shows sleep fragmentation and abnormal sleep stage percentages; these are nonspecific findings and per se do not signify an intrinsic sleep disorder or a cause for the patient's sleep-related symptoms. Causes include (but are not limited to) the first night effect of the sleep study, circadian rhythm disturbances, medication effect or an underlying mood disorder or medical problem.  The patient should be cautioned not to drive, work at heights, or operate dangerous or heavy equipment when tired or sleepy. Review and reiteration of good sleep hygiene measures should be pursued with any patient. Mild PLMs (periodic limb movements of sleep) were noted during this study with no significant arousals; clinical correlation is recommended.   The patient will be seen in follow-up in the sleep clinic at [facility] for discussion of the test results, symptom and treatment compliance review, further management strategies, etc. The patient and his referring provider will be notified of the test results.  I certify that I have reviewed the entire raw data recording prior to the issuance of this report in accordance with the Standards of Accreditation of the American Academy of Sleep Medicine (AASM)  Star Age, MD, PhD Diplomat, American Board of Neurology and Sleep Medicine (Neurology and Sleep Medicine)

## 2021-06-27 NOTE — Addendum Note (Signed)
Addended by: [name] on: 06/27/2021 05:17 PM   Modules accepted: Orders

## 2021-06-28 ENCOUNTER — Telehealth: Payer: Self-pay

## 2021-06-28 NOTE — Telephone Encounter (Signed)
I called pt. I advised pt that Dr. Frances Furbish reviewed their sleep study results and found that patient has central sleep apnea and recommends that pt be treated with a bipap. Dr. Frances Furbish recommends that pt return for a repeat sleep study in order to properly titrate the bipap and ensure a good mask fit. Pt is agreeable to returning for a titration study. I advised pt that our sleep lab will file with pt's insurance and call pt to schedule the sleep study when we hear back from the pt's insurance regarding coverage of this sleep study. Pt verbalized understanding of results. Pt had no questions at this time but was encouraged to call back if questions arise.

## 2021-06-28 NOTE — Telephone Encounter (Signed)
-----   Message from Star Age, MD sent at 06/27/2021  5:16 PM EDT ----- Patient referred by Dr. Virgina Jock for re-eval of his OSA, seen by me on 06/06/21, diagnostic PSG on 06/14/21.   Please call and notify the patient that the recent sleep study showed severe sleep apnea with central events, not actually obstructive sleep apnea. I recommend treatment for this in the form of a BiPAP machine, but in order to get the best treatment and type of machine he needs we will need to bring him back for a repeat sleep study for proper titration and mask fitting and correct monitoring of the oxygen saturations. Please explain to patient. I have placed an order in the chart. Thanks.  Star Age, MD, PhD [facility])

## 2021-06-29 NOTE — Telephone Encounter (Signed)
I spoke with the patient his appointment is scheduled for Monday 07/04/21 he is aware to arrive at 8 PM/.

## 2021-07-01 NOTE — Telephone Encounter (Signed)
Patient daughter Nolon Bussing called and she wants to know why her father is being brought back for another sleep study and having the spending more money when they should of done all this with the first sleep study.. she stated that her father brought his machine and they didn't use it then and wants to know why.Nolon Bussing stated she would like a call back today..   I did explain to her per Dr. Frances Furbish that Dr. Frances Furbish viewed their sleep study results and found that patient has central sleep apnea and recommends that pt be treated with a bipap. Dr. Frances Furbish recommends that pt return for a repeat sleep study in order to properly titrate the bipap and ensure a good mask fit.  The patient daughter is still not understanding. Here phone number is [phone].

## 2021-07-01 NOTE — Telephone Encounter (Signed)
I have contacted the daughter back and was able to review that because he is a new patient to [place], we always like to start with a baseline test to get a baseline. I advised that medicare guidelines require that we are showing starting at a baseline with his sleep study and working up to what machine is needed in order to treat his apnea.  I advised that based off this study and the severity of the apnea and the fact he was having central apnea events occurring Dr Rexene Alberts feels he may even need bipap therapy vs cpap.  I explained that sometimes central apnea can worsen under cpap and that the titration study is the only way we can truly assess what pressure,whether its cpap or moving to bipap, is best for him. I advised that due to medicare guidelines we also are not able to go from baseline study to bipap in one night typically because we have to have a certain amount of time showing information. She understood this but didn't understand that given the severity of the sleep apnea why we at least didn't go ahead and initiate cpap in the first overall study. 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 I advised that I do know that medicare has certain split night criteria and advised that the sleep lab tech can't initiate cpap unless that certain criteria is met. The daughter would like to know what exactly is the reason as to why they didn't start it and I unfortunately do not know the answer. She was pleasant and just wanted to be informed. She understands the purpose of the titration study and states that the patient will be here Monday but she would still like a call back from sleep lab manager or a technician advising of the reason behind not initiating cpap.I did advise that the daytime staff will not be here on Monday and that it would likely be Tuesday/ wed before receiving a call back and she verbalized understanding. She was appreciative for the  information that was provided.

## 2021-07-04 ENCOUNTER — Ambulatory Visit (INDEPENDENT_AMBULATORY_CARE_PROVIDER_SITE_OTHER): Payer: Medicare Other | Admitting: Neurology

## 2021-07-04 DIAGNOSIS — Z950 Presence of cardiac pacemaker: Secondary | ICD-10-CM

## 2021-07-04 DIAGNOSIS — R063 Periodic breathing: Secondary | ICD-10-CM

## 2021-07-04 DIAGNOSIS — G4761 Periodic limb movement disorder: Secondary | ICD-10-CM

## 2021-07-04 DIAGNOSIS — G4720 Circadian rhythm sleep disorder, unspecified type: Secondary | ICD-10-CM

## 2021-07-04 DIAGNOSIS — I480 Paroxysmal atrial fibrillation: Secondary | ICD-10-CM

## 2021-07-04 DIAGNOSIS — R351 Nocturia: Secondary | ICD-10-CM

## 2021-07-04 DIAGNOSIS — G4719 Other hypersomnia: Secondary | ICD-10-CM

## 2021-07-04 DIAGNOSIS — I495 Sick sinus syndrome: Secondary | ICD-10-CM

## 2021-07-04 DIAGNOSIS — G4731 Primary central sleep apnea: Secondary | ICD-10-CM | POA: Diagnosis not present

## 2021-07-05 NOTE — Telephone Encounter (Signed)
Returned call back to patient's daughter, [name]. Explained due to patient's total sleep time was so low that per our guidelines our sleep tech was unable to start PAP therapy that night. Daughter understood and was very appreciative of call back.

## 2021-07-12 NOTE — Addendum Note (Signed)
Addended by: [name] on: 07/12/2021 04:08 PM   Modules accepted: Orders

## 2021-07-12 NOTE — Procedures (Signed)
PATIENT'S NAME:  Isaiah Murphy, Isaiah Murphy DOB:      05/09/1934      MR#:    [id number]     DATE OF RECORDING: 07/04/2021 REFERRING M.D.:  Shon Baton, MD Study Performed:   CPAP  Titration HISTORY: 87 year old gentleman with a history of hypertension, hyperlipidemia, paroxysmal A-fib with status post cardioversion, sick sinus syndrome, history of syncope, status post pacemaker placement, asthma, allergic rhinitis, chronic constipation, reflux disease, osteoarthritis with status post bilateral hip replacements, and mildly overweight state, who presents for a full night bilevel titration study to treat his primary central sleep apnea. His baseline sleep study from 06/14/21 showed severe primary central sleep apnea with an AHI of 50.3/hour and O2 nadir of 86%, with almost exclusively central respiratory events.  The patient endorsed the Epworth Sleepiness Scale at 14 points. The patient's weight 181 pounds with a height of 70 (inches), resulting in a BMI of 25.9 kg/m2. The patient's neck circumference measured 17 inches.  CURRENT MEDICATIONS: Ventolin HFA, Eliquis, Lipitor, Edarbyclor, Alvesco, Nexium, Flonase, Acular, Singulair, Mega Multi Men, Patanol, Kenalog  PROCEDURE:  This is a multichannel digital polysomnogram utilizing the SomnoStar 11.2 system.  Electrodes and sensors were applied and monitored per AASM Specifications.   EEG, EOG, Chin and Limb EMG, were sampled at 200 Hz.  ECG, Snore and Nasal Pressure, Thermal Airflow, Respiratory Effort, CPAP Flow and Pressure, Oximetry was sampled at 50 Hz. Digital video and audio were recorded.      The patient was fitted with a medium Simplus FFM. BiPAP was initiated at 8/4 cmH20 with heated humidity per AASM standards and pressure was advanced to 9/5 cmH20 because of hypopneas, apneas and desaturations.  He had ongoing central sleep apnea and his AHI was 54/hour on standard BiPAP of 9/5 cm with primary central events. He was started on BiPAP ST and titrated on BiPAP ST at  10/6 cm and 11/7 cm with an AHI of 46.7/hour on BiPAP ST of 11/7 cm. He was started on ASV at EPAP or 5 cm and titrated to an EPAP of 7 cm. At the final pressure setting of EPAP of 7 cm, with min PS of 3 cm and max PS of 15 cm, his AHI was 0/hour, and O2 nadir of 92% with non-supine REM sleep achieved.   Lights Out was at 20:44 and Lights On at 05:04. Total recording time (TRT) was 501 minutes, with a total sleep time (TST) of 394.5 minutes. The patient's sleep latency was 72 minutes, which is delayed. REM latency was 245 minutes, which is markedly delayed. The sleep efficiency was 78.7 %.    SLEEP ARCHITECTURE: WASO (Wake after sleep onset)  was 58.5 minutes with moderate sleep fragmentation noted while on BiPAP and BiPAP ST, but good sleep consolidation on ASV.  There were 15.5 minutes in Stage N1, 320 minutes Stage N2, 0 minutes Stage N3 and 59 minutes in Stage REM.  The percentage of Stage N1 was 3.9%, Stage N2 was 81.1%, which is markedly increased, Stage N3 was absent, and Stage R (REM sleep) was 15%, which is mildly reduced. The arousals were noted as: 53 were spontaneous, 2 were associated with PLMs, 59 were associated with respiratory events.  RESPIRATORY ANALYSIS:  There was a total of 141 respiratory events: 5 obstructive apneas, 117 central apneas and 1 mixed apneas with a total of 123 apneas and an apnea index (AI) of 18.7 /hour. There were 18 hypopneas with a hypopnea index of 2.7/hour. The patient also had 0 respiratory  event related arousals (RERAs).      The total APNEA/HYPOPNEA INDEX  (AHI) was 21.4 /hour and the total RESPIRATORY DISTURBANCE INDEX was 21.4 /hour  3 events occurred in REM sleep and 138 events in NREM. The REM AHI was 3.1 /hour versus a non-REM AHI of 24.7 /hour.  The patient spent 152 minutes of total sleep time in the supine position and 243 minutes in non-supine. The supine AHI was 50.9, versus a non-supine AHI of 3.0.  OXYGEN SATURATION & C02:  The baseline 02  saturation was 93%, with the lowest being 86%. Time spent below 89% saturation equaled 11 minutes.  PERIODIC LIMB MOVEMENTS:  The patient had a total of 173 Periodic Limb Movements. The Periodic Limb Movement (PLM) index was 26.3 and the PLM Arousal index was .3 /hour.  Audio and video analysis did not show any abnormal or unusual movements, behaviors, phonations or vocalizations. The patient took 2 bathroom breaks. Snoring was reduced. The EKG was in keeping with normal sinus rhythm (NSR).  Post-study, the patient indicated that sleep was better than usual.   IMPRESSION: Primary Central Sleep Apnea  Periodic Limb Movement Disorder (PLMD) Dysfunctions associated with sleep stages or arousal from sleep   RECOMMENDATIONS: This study demonstrates resolution of the patient's primary central sleep apnea with ASV (adaptive servo ventilation). Standard BiPAP nor BiPAP ST were effective in reducing his severe central sleep apnea. I will recommend home ASV on an EPAP of 7 cm, max PS of 15, min PS of 3 via medium Simplus FFM. The patient will be advised to be fully compliant with PAP therapy to improve sleep related symptoms and decrease long term cardiovascular risks. The patient should be reminded, that it may take up to 3 months to get fully used to using PAP with all planned sleep. The earlier full compliance is achieved, the better long term compliance tends to be. Please note that sleep apnea may carry additional perioperative morbidity. Patients with significant obstructive sleep apnea should receive perioperative PAP therapy and the surgeons and particularly the anesthesiologist should be informed of the diagnosis and the severity of the sleep disordered breathing. This study shows sleep fragmentation and abnormal sleep stage percentages; these are nonspecific findings and per se do not signify an intrinsic sleep disorder or a cause for the patient's sleep-related symptoms. Causes include (but are not  limited to) the first night effect of the sleep study, circadian rhythm disturbances, medication effect or an underlying mood disorder or medical problem.  The patient should be cautioned not to drive, work at heights, or operate dangerous or heavy equipment when tired or sleepy. Review and reiteration of good sleep hygiene measures should be pursued with any patient. Moderate PLMs (periodic limb movements of sleep) were noted during this study with no significant arousals; clinical correlation is recommended.   The patient will be seen in follow-up in the sleep clinic at [facility] for discussion of the test results, symptom and treatment compliance review, further management strategies, etc. The patient and his referring provider will be notified of the test results.   I certify that I have reviewed the entire raw data recording prior to the issuance of this report in accordance with the Standards of Accreditation of the American Academy of Sleep Medicine (AASM)   Star Age, MD, PhD Diplomat, American Board of Neurology and Sleep Medicine (Neurology and Sleep Medicine)

## 2021-07-14 ENCOUNTER — Encounter: Payer: Self-pay | Admitting: *Deleted

## 2021-07-14 ENCOUNTER — Telehealth: Payer: Self-pay | Admitting: *Deleted

## 2021-07-14 NOTE — Telephone Encounter (Signed)
-----   Message from Star Age, MD sent at 07/12/2021  4:08 PM EDT ----- Patient referred by Dr. Virgina Jock for re-eval of his OSA, seen by me on 06/06/21, diagnostic PSG on 06/14/21.  Patient had a CPAP titration study on 07/04/21.  Please call and inform patient that I have entered an order for treatment with positive airway pressure (PAP) treatment for treatment of his central sleep apnea (CSA). He required a special machine called ASV. We will, therefore, arrange for a machine for home use through a DME (durable medical equipment) company of His choice; and I will see the patient back in follow-up in about 61-89 days after starting the ASV usage at home. Please also explain to the patient that I will be looking out for compliance data, which can be downloaded from the machine (stored on an SD card, that is inserted in the machine) or via remote access through a modem, that is built into the machine. At the time of the followup appointment we will discuss sleep study results and how it is going with PAP treatment at home. Please advise patient to bring His machine at the time of the first FU visit, even though this is cumbersome. Bringing the machine for every visit after that will likely not be needed, but often helps for the first visit to troubleshoot if needed. Please re-enforce the importance of compliance with treatment and the need for [place] to monitor compliance data - often an insurance requirement and actually good feedback for the patient as far as how they are doing.  Also remind patient, that any interim PAP machine or mask issues should be first addressed with the DME company, as they can often help better with technical and mask fit issues. Please ask if patient has a preference regarding DME company.  Please also make sure, the patient has a follow-up appointment with me after 61 days from the setup date, thanks. Be sure to send all of the technical report too as insurance may need the titration table to  approve ASV. Thanks,   Star Age, MD, PhD [facility])

## 2021-07-14 NOTE — Telephone Encounter (Signed)
I called Isaiah Murphy and also daughter [name]. I advised Isaiah Murphy that Dr. Frances Furbish reviewed their sleep study results and found that Isaiah Murphy needs ASV for Central Sleep Apneas. Dr. Frances Furbish recommends that Isaiah Murphy start ASV. I reviewed PAP compliance expectations with the Isaiah Murphy. Isaiah Murphy is agreeable to starting an ASV. I advised Isaiah Murphy that an order will be sent to a DME, [facility], and they will call the Isaiah Murphy within about one week after they file with the Isaiah Murphy's insurance. [facility] will show the Isaiah Murphy how to use the machine, fit for masks, and troubleshoot the ASV if needed. A follow up appt was made for insurance purposes with Dr. Frances Furbish on 09-15-2021 at 0745. Isaiah Murphy verbalized understanding to arrive 15 minutes early and bring their ASV. A letter with all of this information in it will be mailed to the Isaiah Murphy as a reminder. I verified with the Isaiah Murphy that the address we have on file is correct. Isaiah Murphy verbalized understanding of results. Isaiah Murphy had no questions at this time but was encouraged to call back if questions arise. I have sent the order to [facility] and have received confirmation that they have received the order.

## 2021-07-22 ENCOUNTER — Ambulatory Visit (INDEPENDENT_AMBULATORY_CARE_PROVIDER_SITE_OTHER): Payer: Medicare Other | Admitting: Allergy

## 2021-07-22 ENCOUNTER — Encounter: Payer: Self-pay | Admitting: Allergy

## 2021-07-22 VITALS — BP 100/80 | HR 75 | Temp 97.8°F | Ht 69.0 in | Wt 177.4 lb

## 2021-07-22 DIAGNOSIS — J3089 Other allergic rhinitis: Secondary | ICD-10-CM

## 2021-07-22 DIAGNOSIS — J4530 Mild persistent asthma, uncomplicated: Secondary | ICD-10-CM | POA: Diagnosis not present

## 2021-07-22 MED ORDER — BUDESONIDE-FORMOTEROL FUMARATE 160-4.5 MCG/ACT IN AERO: 2.0000 | INHALATION_SPRAY | Freq: Two times a day (BID) | RESPIRATORY_TRACT | 5 refills | Status: DC

## 2021-07-22 NOTE — Progress Notes (Signed)
Follow-up Note  RE: Isaiah Murphy MRN: [id number] DOB: 1934-08-12 Date of Office Visit: 07/22/2021   History of present illness: Isaiah Murphy is a 87 y.o. male presenting today for follow-up of asthma and allergic rhinitis.  He was last seen in the office on 06/21/2021 Dr. Lucie Leather for asthma flareup.  He states he was advised to come back in for a follow-up of his symptoms.  He states his symptoms have improved over the past month as he was having chest congestion and chest tightness as well as dry cough.  He also states he was not sleeping well at that time.   At last visit he was prescribed prednisone course and that helped very much.  He also was recommded to take mucinex dm which also helped.  As of earlier this week he stopped taking the mucinex dm.  He states he still has a dry cough now but it is better.  He does not feel he can walk down the block without shortness of breath however.   He has been using albuterol once a day over the past month still for symptom relief.  He is taking alvesco 2 puffs twice a day with spacer device as well as montelukast daily.  He also uses Flonase for nasal symptom control.  Review of systems: Review of Systems  Constitutional: Negative.   HENT: Negative.    Eyes: Negative.   Respiratory:  Positive for cough and shortness of breath.   Cardiovascular: Negative.   Musculoskeletal: Negative.   Skin: Negative.   Allergic/Immunologic: Negative.   Neurological: Negative.      All other systems negative unless noted above in HPI  Past medical/social/surgical/family history have been reviewed and are unchanged unless specifically indicated below.  No changes  Medication List: Current Outpatient Medications  Medication Sig Dispense Refill   albuterol (VENTOLIN HFA) 108 (90 Base) MCG/ACT inhaler Inhale 2 puffs into the lungs every 6 (six) hours as needed for wheezing or shortness of breath. 18 g 2   apixaban (ELIQUIS) 5 MG TABS tablet Take 1  tablet (5 mg total) by mouth 2 (two) times daily. 180 tablet 1   atorvastatin (LIPITOR) 20 MG tablet Take 20 mg by mouth at bedtime.      budesonide-formoterol (SYMBICORT) 160-4.5 MCG/ACT inhaler Inhale 2 puffs into the lungs 2 (two) times daily. 1 each 5   ciclesonide (ALVESCO) 160 MCG/ACT inhaler USE 1 INHALATION ORALLY TWICE DAILY TO PREVENT COUGH OR WHEEZE. INCREASE TO USE 2 INHALATIONS ORALLY TWICE DAILY WITH ASTHMA FLARE. RINSE, GARGLE AND SPIT AFTER USE. 18.3 g 4   dextromethorphan-guaiFENesin (MUCINEX DM) 30-600 MG 12hr tablet Take 1 tablet by mouth 2 (two) times daily. 60 tablet 3   esomeprazole (NEXIUM) 40 MG capsule Take 40 mg by mouth daily as needed (reflux).     fluticasone (FLONASE) 50 MCG/ACT nasal spray Place 1 spray into both nostrils 2 (two) times daily as needed for allergies or rhinitis. 16 g 11   ketorolac (ACULAR) 0.5 % ophthalmic solution 1 drop. Affected eye as needed     levocetirizine (XYZAL) 5 MG tablet Take 1 tablet (5 mg total) by mouth 2 (two) times daily as needed (Can take an extra dose during flare ups.). 60 tablet 11   montelukast (SINGULAIR) 10 MG tablet Take 1 tablet (10 mg total) by mouth at bedtime. 30 tablet 11   Multiple Vitamins-Minerals (MEGA MULTI MEN) TABS Take 1 tablet by mouth once a week.     olopatadine (  PATANOL) 0.1 % ophthalmic solution Place 1 drop into both eyes daily.      triamcinolone (KENALOG) 0.1 % Apply 1 application topically 2 (two) times daily as needed (itching). 80 g 3   No current facility-administered medications for this visit.     Known medication allergies: Allergies  Allergen Reactions   Penicillins Rash    Has patient had a PCN reaction causing immediate rash, facial/tongue/throat swelling, SOB or lightheadedness with hypotension: Yes Has patient had a PCN reaction causing severe rash involving mucus membranes or skin necrosis: Yes Has patient had a PCN reaction that required hospitalization: No Has patient had a PCN  reaction occurring within the last 10 years: No If all of the above answers are "NO", then may proceed with Cephalosporin use. Rash at the injection site     Physical examination: Blood pressure 100/80, pulse 75, temperature 97.8 F (36.6 C), height 5\' 9"  (1.753 m), weight 177 lb 6.4 oz (80.5 kg), SpO2 97 %.  General: Alert, interactive, in no acute distress. HEENT: PERRLA, TMs pearly gray, turbinates non-edematous without discharge, post-pharynx non erythematous. Neck: Supple without lymphadenopathy. Lungs: Clear to auscultation without wheezing, rhonchi or rales. {no increased work of breathing. CV: Normal S1, S2 without murmurs. Abdomen: Nondistended, nontender. Skin: Warm and dry, without lesions or rashes. Extremities:  No clubbing, cyanosis or edema. Neuro:   Grossly intact.  Diagnositics/Labs: None today  Assessment and plan: Asthma, mild persistent -symptoms have improved but have not resolved completely.  Still not under good control with daily albuterol use Allergic rhinitis, perennial   1.  Stop Alvesco at this time and will step up therapy.  Start Symbicort 2 puffs twice a day with spacer device.   This is a combination inhaler with inhaled steroid component similar to Alvesco and a long-acting albuterol component.    2.  Continue Flonase - 1 spray each nostril 1-2  times per day depending on disease activity  3.  Continue montelukast 10 mg - 1 tablet 1 time per day  4.  If needed:   A.  Albuterol HFA -2 inhalations every 4-6 hours  B.  OTC antihistamine  5.  Breathing action plan (if you developed respiratory illness or flare up):  add in Alvesco 2 puffs twice a day to your Symbicort  6.  Follow-up in Dr. in 3-4 months or sooner if needed  I appreciate the opportunity to take part in Isaiah Murphy's care. Please do not hesitate to contact me with questions.  Sincerely,   Lucie Leather, MD Allergy/Immunology [facility]

## 2021-07-22 NOTE — Patient Instructions (Addendum)
  1.  Stop Alvesco at this time and will step up therapy.  Start Symbicort 2 puffs twice a day with spacer device.   This is a combination inhaler with inhaled steroid component similar to Alvesco and a long-acting albuterol component.    2.  Continue Flonase - 1 spray each nostril 1-2  times per day depending on disease activity  3.  Continue montelukast 10 mg - 1 tablet 1 time per day  4.  If needed:   A.  Albuterol HFA -2 inhalations every 4-6 hours  B.  OTC antihistamine  5.  Breathing action plan (if you developed respiratory illness or flare up):  add in Alvesco 2 puffs twice a day to your Symbicort  6.  Follow-up in Dr. Lucie Leather in 3-4 months or sooner if needed

## 2021-08-02 ENCOUNTER — Ambulatory Visit (INDEPENDENT_AMBULATORY_CARE_PROVIDER_SITE_OTHER): Payer: Medicare Other | Admitting: Allergy and Immunology

## 2021-08-02 ENCOUNTER — Encounter: Payer: Self-pay | Admitting: Allergy and Immunology

## 2021-08-02 VITALS — BP 152/88 | HR 78 | Temp 97.6°F | Resp 20 | Ht 69.0 in | Wt 180.6 lb

## 2021-08-02 DIAGNOSIS — J3089 Other allergic rhinitis: Secondary | ICD-10-CM

## 2021-08-02 DIAGNOSIS — J4540 Moderate persistent asthma, uncomplicated: Secondary | ICD-10-CM | POA: Diagnosis not present

## 2021-08-02 DIAGNOSIS — J189 Pneumonia, unspecified organism: Secondary | ICD-10-CM | POA: Diagnosis not present

## 2021-08-02 MED ORDER — METHYLPREDNISOLONE ACETATE 80 MG/ML IJ SUSP
80.0000 mg | Freq: Once | INTRAMUSCULAR | Status: AC
  Administered 2021-08-02: 80 mg via INTRAMUSCULAR

## 2021-08-02 MED ORDER — BUDESONIDE-FORMOTEROL FUMARATE 160-4.5 MCG/ACT IN AERO: 2.0000 | INHALATION_SPRAY | Freq: Two times a day (BID) | RESPIRATORY_TRACT | 5 refills | Status: DC

## 2021-08-02 MED ORDER — ALBUTEROL SULFATE HFA 108 (90 BASE) MCG/ACT IN AERS: 2.0000 | INHALATION_SPRAY | Freq: Four times a day (QID) | RESPIRATORY_TRACT | 2 refills | Status: DC | PRN

## 2021-08-02 MED ORDER — FLUTICASONE PROPIONATE 50 MCG/ACT NA SUSP: 1.0000 | Freq: Two times a day (BID) | NASAL | 11 refills | Status: DC | PRN

## 2021-08-02 MED ORDER — LEVOCETIRIZINE DIHYDROCHLORIDE 5 MG PO TABS: 5.0000 mg | ORAL_TABLET | Freq: Two times a day (BID) | ORAL | 11 refills | Status: DC | PRN

## 2021-08-02 MED ORDER — MONTELUKAST SODIUM 10 MG PO TABS: 10.0000 mg | ORAL_TABLET | Freq: Every evening | ORAL | 11 refills | Status: DC

## 2021-08-02 MED ORDER — DM-GUAIFENESIN ER 30-600 MG PO TB12: 1.0000 | ORAL_TABLET | Freq: Two times a day (BID) | ORAL | 3 refills | Status: DC

## 2021-08-02 NOTE — Progress Notes (Signed)
Isaiah [facility] - [place] - [place] - [place] - [place]   Follow-up Note  Referring Provider: Creola Corn, MD Primary Provider: Creola Corn, MD Date of Office Visit: 08/02/2021  Subjective:   Isaiah Murphy (DOB: 08-14-1934) is a 87 y.o. male who [facility] in re-evaluation of the following:  HPI: Isaiah Murphy returns to this clinic in evaluation of asthma and allergic rhinitis.  I last saw him in this clinic on 21 Jun 2021.  He visited with Dr. Delorse Lek on 22 July 2021.  When I last saw him in this clinic he was having an inflammatory exacerbation of his respiratory tract requiring a low-dose of systemic steroid and although most of his coughing resolved he still has some shortness of breath and he subsequently visited with Dr. Delorse Lek who placed him on Symbicort to replace his Alvesco.  He actually did a little bit better although still had some lingering shortness of breath and cough.  Unfortunately, about 48 hours ago he had acute onset of unrelenting coughing associated with shortness of breath and laryngitis and some sore throat when he coughs but not when he swallows.  He has no associated systemic or constitutional symptoms and does not have a fever.  He went to see a physician yesterday and had a chest x-ray which identified a pneumonia in his left lung and he was treated with 2 different antibiotics including a cephalosporin and a doxycycline.  Allergies as of 08/02/2021       Reactions   Penicillins Rash   Has patient had a PCN reaction causing immediate rash, facial/tongue/throat swelling, SOB or lightheadedness with hypotension: Yes Has patient had a PCN reaction causing severe rash involving mucus membranes or skin necrosis: Yes Has patient had a PCN reaction that required hospitalization: No Has patient had a PCN reaction occurring within the last 10 years: No If all of the above answers are "NO", then may proceed with  Cephalosporin use. Rash at the injection site        Medication List    albuterol 108 (90 Base) MCG/ACT inhaler Commonly known as: VENTOLIN HFA Inhale 2 puffs into the lungs every 6 (six) hours as needed for wheezing or shortness of breath.   apixaban 5 MG Tabs tablet Commonly known as: ELIQUIS Take 1 tablet (5 mg total) by mouth 2 (two) times daily.   atorvastatin 20 MG tablet Commonly known as: LIPITOR Take 20 mg by mouth at bedtime.   budesonide-formoterol 160-4.5 MCG/ACT inhaler Commonly known as: Symbicort Inhale 2 puffs into the lungs 2 (two) times daily.   cefUROXime 500 MG tablet Commonly known as: CEFTIN Take 1 tablet by mouth as directed.   doxycycline 100 MG capsule Commonly known as: VIBRAMYCIN Take 1 capsule by mouth 2 (two) times daily.   esomeprazole 40 MG capsule Commonly known as: NEXIUM Take 40 mg by mouth daily as needed (reflux).   fluticasone 50 MCG/ACT nasal spray Commonly known as: FLONASE Place 1 spray into both nostrils 2 (two) times daily as needed for allergies or rhinitis.   ketorolac 0.5 % ophthalmic solution Commonly known as: ACULAR 1 drop. Affected eye as needed   levocetirizine 5 MG tablet Commonly known as: XYZAL Take 1 tablet (5 mg total) by mouth 2 (two) times daily as needed (Can take an extra dose during flare ups.).   Mega Multi Men Tabs Take 1 tablet by mouth once a week.   montelukast 10 MG tablet Commonly known  as: SINGULAIR Take 1 tablet (10 mg total) by mouth at bedtime.   olopatadine 0.1 % ophthalmic solution Commonly known as: PATANOL Place 1 drop into both eyes daily.   triamcinolone cream 0.1 % Commonly known as: KENALOG Apply 1 application topically 2 (two) times daily as needed (itching).    Past Medical History:  Diagnosis Date   Acute blood loss anemia    Allergic rhinitis    Arthritis    Asthma    Chronic anticoagulation    Constipation    Dyslipidemia    Dysrhythmia    history of  paroxysmal A Fib and typical A Flutter    GERD (gastroesophageal reflux disease)    History of bronchitis    History of sick sinus syndrome    History of syncope    HLD (hyperlipidemia)    Hx of syncope    Hypertension    Hyponatremia    Leukocytosis    Moderate persistent asthma with acute exacerbation in adult    Osteoarthritis    s/p R & L THR   Presence of permanent cardiac pacemaker    Primary osteoarthritis of right hip    Sleep apnea    pt states not currently using CPAP machine    Unsteady gait     Past Surgical History:  Procedure Laterality Date   CARDIOVERSION N/A 04/28/2013   Procedure: CARDIOVERSION;  Surgeon: Lars Masson, MD;  Location: [facility] ENDOSCOPY;  Service: Cardiovascular;  Laterality: N/A;   HEMORRHOID SURGERY     left shoulder surgery      secondary to torn ligament / subluxation   TEE WITHOUT CARDIOVERSION N/A 04/28/2013   Procedure: TRANSESOPHAGEAL ECHOCARDIOGRAM (TEE);  Surgeon: Lars Masson, MD;  Location: [facility] ENDOSCOPY;  Service: Cardiovascular;  Laterality: N/A;   TOTAL HIP ARTHROPLASTY Right 03/08/2015   Procedure: RIGHT TOTAL HIP ARTHROPLASTY ANTERIOR APPROACH;  Surgeon: Ollen Gross, MD;  Location: [facility] ORS;  Service: Orthopedics;  Laterality: Right;   TOTAL HIP ARTHROPLASTY Left 01/07/2020   Procedure: TOTAL HIP ARTHROPLASTY ANTERIOR APPROACH;  Surgeon: Ollen Gross, MD;  Location: [facility] ORS;  Service: Orthopedics;  Laterality: Left;     Review of systems negative except as noted in HPI / PMHx or noted below:  Review of Systems  Constitutional: Negative.   HENT: Negative.    Eyes: Negative.   Respiratory: Negative.    Cardiovascular: Negative.   Gastrointestinal: Negative.   Genitourinary: Negative.   Musculoskeletal: Negative.   Skin: Negative.   Neurological: Negative.   Endo/Heme/Allergies: Negative.   Psychiatric/Behavioral: Negative.       Objective:   Vitals:   08/02/21 0920  BP: (!) 152/88  Pulse: 78  Resp: 20   Temp: 97.6 F (36.4 C)  SpO2: 98%   Height: 5\' 9"  (175.3 cm)  Weight: 180 lb 9.6 oz (81.9 kg)   Physical Exam Constitutional:      Appearance: He is not diaphoretic.     Comments: Raspy voice  HENT:     Head: Normocephalic.     Right Ear: Tympanic membrane, ear canal and external ear normal.     Left Ear: Tympanic membrane, ear canal and external ear normal.     Nose: Nose normal. No mucosal edema or rhinorrhea.     Mouth/Throat:     Pharynx: Uvula midline. Posterior oropharyngeal erythema present. No oropharyngeal exudate.  Eyes:     Conjunctiva/sclera: Conjunctivae normal.  Neck:     Thyroid: No thyromegaly.     Trachea: Trachea normal.  No tracheal tenderness or tracheal deviation.  Cardiovascular:     Rate and Rhythm: Normal rate and regular rhythm.     Heart sounds: Normal heart sounds, S1 normal and S2 normal. No murmur heard. Pulmonary:     Effort: No respiratory distress.     Breath sounds: Normal breath sounds. No stridor. No wheezing or rales.  Lymphadenopathy:     Head:     Right side of head: No tonsillar adenopathy.     Left side of head: No tonsillar adenopathy.     Cervical: No cervical adenopathy.  Skin:    Findings: No erythema or rash.     Nails: There is no clubbing.  Neurological:     Mental Status: He is alert.     Diagnostics:    Spirometry was performed and demonstrated an FEV1 of 2.50 at 69 % of predicted.  The patient had an Asthma Control Test with the following results:  .    Results of a chest x-ray obtained 01 August 2021 identifies the following:   1. Small left lower lobe basilar airspace opacity suspicious for pneumonia or aspiration. 2. Bilateral lung hyperinflation.      Assessment and Plan:   1. Not well controlled moderate persistent asthma   2. Perennial allergic rhinitis   3. Pneumonia of left lower lobe due to infectious organism    1.  Continue Symbicort 160 - 2 inhalations 2 times per day w/ spacer (empty  lungs)  2.  Continue Flonase - 1 spray each nostril 1-2  times per day    3.  Continue montelukast 10 mg - 1 tablet 1 time per day  4.  If needed:   A.  Albuterol HFA -2 inhalations every 4-6 hours  B.  OTC antihistamine  5. For this current episode:   A. Depomedrol 80 IM delivered in clinic today  B. Mucinex DM - 1-2 times per day  C. Continue antibiotics until completed  6.  Return to clinic in 1 month or earlier if problem  Isaiah Murphy has some type of respiratory tract infection giving rise to a flare of his respiratory tract inflammatory condition.  Based upon the fact that he has pharyngitis in conjunction with a chest x-ray documented left lower lobe airspace abnormality we will assume that he may have a viral infection giving rise to this issue although certainly he appears to be covered quite well regarding possible bacterial infection with his combination of doxycycline and a cephalosporin.  I will give him a systemic steroid to address the inflammatory component of his respiratory tract disease.  We will hold off on any narcotic-based cough suppressants at this point in time and just see what happens over the course of the next several days.  I have asked him to contact me should he have difficulty in the face of this therapy.  I would like to see him back in this clinic in 1 month to address his response to consistent use of Symbicort and other anti-inflammatory agents for his airway.  Laurette Schimke, MD Allergy / Immunology [facility]

## 2021-08-03 ENCOUNTER — Encounter: Payer: Self-pay | Admitting: Allergy and Immunology

## 2021-08-30 ENCOUNTER — Ambulatory Visit (INDEPENDENT_AMBULATORY_CARE_PROVIDER_SITE_OTHER): Payer: Medicare Other | Admitting: Allergy and Immunology

## 2021-08-30 VITALS — BP 128/78 | HR 83 | Temp 97.6°F | Ht 69.0 in | Wt 180.0 lb

## 2021-08-30 DIAGNOSIS — J3089 Other allergic rhinitis: Secondary | ICD-10-CM

## 2021-08-30 DIAGNOSIS — J4540 Moderate persistent asthma, uncomplicated: Secondary | ICD-10-CM | POA: Diagnosis not present

## 2021-08-30 DIAGNOSIS — R0609 Other forms of dyspnea: Secondary | ICD-10-CM

## 2021-08-30 MED ORDER — SPACER/AERO-HOLDING CHAMBERS DEVI: 1.0000 | 2 refills | Status: DC

## 2021-08-30 MED ORDER — MONTELUKAST SODIUM 10 MG PO TABS: 10.0000 mg | ORAL_TABLET | Freq: Every evening | ORAL | 11 refills | Status: DC

## 2021-08-30 MED ORDER — BUDESONIDE-FORMOTEROL FUMARATE 160-4.5 MCG/ACT IN AERO: 2.0000 | INHALATION_SPRAY | Freq: Two times a day (BID) | RESPIRATORY_TRACT | 5 refills | Status: DC

## 2021-08-30 MED ORDER — FLUTICASONE PROPIONATE 50 MCG/ACT NA SUSP: 1.0000 | Freq: Two times a day (BID) | NASAL | 11 refills | Status: DC | PRN

## 2021-08-30 MED ORDER — LEVOCETIRIZINE DIHYDROCHLORIDE 5 MG PO TABS: 5.0000 mg | ORAL_TABLET | Freq: Every day | ORAL | 5 refills | Status: DC | PRN

## 2021-08-30 MED ORDER — ALBUTEROL SULFATE HFA 108 (90 BASE) MCG/ACT IN AERS
2.0000 | INHALATION_SPRAY | RESPIRATORY_TRACT | 1 refills | Status: DC | PRN
Start: 2021-08-30 — End: 2022-02-28

## 2021-08-30 NOTE — Progress Notes (Unsigned)
[place] - [place] - [place] - [place] - [place]   Follow-up Note  Referring Provider: Creola Corn, MD Primary Provider: Creola Corn, MD Date of Office Visit: 08/30/2021  Subjective:   Isaiah Murphy (DOB: 1934-02-09) is a 87 y.o. 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  I last saw him in this clinic on 02 August 2021.  During that last visit he appeared to have a significant exacerbation of his respiratory tract issue related to possible infectious trigger.  We treated him with a systemic steroid and he continued to utilize antibiotics given to him by his primary care doctor.  He resolved that issue and no longer has any coughing and he is 95% better.  He does not need to use a short acting bronchodilator for the past 2 weeks.  However, he has significant dyspnea on exertion.  If he walks 1 block he has to stop because he is short of breath and he is completely exhausted with fatigue.  He does have complete heart block and is utilizing a pacemaker to keep his heart paced.  His last echocardiogram in 2018 did not identify any form of cardiomyopathy.  He does not have any lower extremity swelling.  He is using his CPAP machine for his obstructive sleep apnea and he feels great when he wakes up in the morning.  Allergies as of 08/30/2021       Reactions   Penicillins Rash   Has patient had a PCN reaction causing immediate rash, facial/tongue/throat swelling, SOB or lightheadedness with hypotension: Yes Has patient had a PCN reaction causing severe rash involving mucus membranes or skin necrosis: Yes Has patient had a PCN reaction that required hospitalization: No Has patient had a PCN reaction occurring within the last 10 years: No If all of the above answers are "NO", then may proceed with Cephalosporin use. Rash at the injection site         Medication List    albuterol 108 (90 Base) MCG/ACT inhaler Commonly known as: VENTOLIN HFA Inhale 2 puffs into the lungs every 6 (six) hours as needed for wheezing or shortness of breath.   Alvesco 160 MCG/ACT inhaler Generic drug: ciclesonide USE 1 INHALATION ORALLY TWICE DAILY TO PREVENT COUGH OR WHEEZE. INCREASE TO USE 2 INHALATIONS ORALLY TWICE DAILY WITH ASTHMA FLARE. RINSE, GARGLE AND SPIT AFTER USE.   apixaban 5 MG Tabs tablet Commonly known as: ELIQUIS Take 1 tablet (5 mg total) by mouth 2 (two) times daily.   atorvastatin 20 MG tablet Commonly known as: LIPITOR Take 20 mg by mouth at bedtime.   budesonide-formoterol 160-4.5 MCG/ACT inhaler Commonly known as: Symbicort Inhale 2 puffs into the lungs 2 (two) times daily.   dextromethorphan-guaiFENesin 30-600 MG 12hr tablet Commonly known as: MUCINEX DM Take 1 tablet by mouth 2 (two) times daily.   esomeprazole 40 MG capsule Commonly known as: NEXIUM Take 40 mg by mouth daily as needed (reflux).   fluticasone 50 MCG/ACT nasal spray Commonly known as: FLONASE Place 1 spray into both nostrils 2 (two) times daily as needed for allergies or rhinitis.   ketorolac 0.5 % ophthalmic solution Commonly known as: ACULAR 1 drop. Affected eye as needed   levocetirizine 5 MG tablet Commonly known as: XYZAL Take 1 tablet (5 mg total) by mouth 2 (two) times daily as needed (Can take an extra  dose during flare ups.).   Mega Multi Men Tabs Take 1 tablet by mouth once a week.   montelukast 10 MG tablet Commonly known as: SINGULAIR Take 1 tablet (10 mg total) by mouth at bedtime.   olopatadine 0.1 % ophthalmic solution Commonly known as: PATANOL Place 1 drop into both eyes daily.   triamcinolone cream 0.1 % Commonly known as: KENALOG Apply 1 application topically 2 (two) times daily as needed (itching).    Past Medical History:  Diagnosis Date   Acute blood loss anemia    Allergic rhinitis    Arthritis     Asthma    Chronic anticoagulation    Constipation    Dyslipidemia    Dysrhythmia    history of paroxysmal A Fib and typical A Flutter    GERD (gastroesophageal reflux disease)    History of bronchitis    History of sick sinus syndrome    History of syncope    HLD (hyperlipidemia)    Hx of syncope    Hypertension    Hyponatremia    Leukocytosis    Moderate persistent asthma with acute exacerbation in adult    Osteoarthritis    s/p R & L THR   Presence of permanent cardiac pacemaker    Primary osteoarthritis of right hip    Sleep apnea    pt states not currently using CPAP machine    Unsteady gait     Past Surgical History:  Procedure Laterality Date   CARDIOVERSION N/A 04/28/2013   Procedure: CARDIOVERSION;  Surgeon: Lars Masson, MD;  Location: [facility] ENDOSCOPY;  Service: Cardiovascular;  Laterality: N/A;   HEMORRHOID SURGERY     left shoulder surgery      secondary to torn ligament / subluxation   TEE WITHOUT CARDIOVERSION N/A 04/28/2013   Procedure: TRANSESOPHAGEAL ECHOCARDIOGRAM (TEE);  Surgeon: Lars Masson, MD;  Location: [facility] ENDOSCOPY;  Service: Cardiovascular;  Laterality: N/A;   TOTAL HIP ARTHROPLASTY Right 03/08/2015   Procedure: RIGHT TOTAL HIP ARTHROPLASTY ANTERIOR APPROACH;  Surgeon: Ollen Gross, MD;  Location: [facility] ORS;  Service: Orthopedics;  Laterality: Right;   TOTAL HIP ARTHROPLASTY Left 01/07/2020   Procedure: TOTAL HIP ARTHROPLASTY ANTERIOR APPROACH;  Surgeon: Ollen Gross, MD;  Location: [facility] ORS;  Service: Orthopedics;  Laterality: Left;     Review of systems negative except as noted in HPI / PMHx or noted below:  Review of Systems  Constitutional: Negative.   HENT: Negative.    Eyes: Negative.   Respiratory: Negative.    Cardiovascular: Negative.   Gastrointestinal: Negative.   Genitourinary: Negative.   Musculoskeletal: Negative.   Skin: Negative.   Neurological: Negative.   Endo/Heme/Allergies: Negative.   Psychiatric/Behavioral:  Negative.       Objective:   Vitals:   08/30/21 1056  BP: 128/78  Pulse: 83  Temp: 97.6 F (36.4 C)  SpO2: 96%   Height: 5\' 9"  (175.3 cm)  Weight: 180 lb (81.6 kg)   Physical Exam Constitutional:      Appearance: He is not diaphoretic.  HENT:     Head: Normocephalic.     Right Ear: Tympanic membrane, ear canal and external ear normal.     Left Ear: Tympanic membrane, ear canal and external ear normal.     Murphy: Murphy normal. No mucosal edema or rhinorrhea.     Mouth/Throat:     Pharynx: Uvula midline. No oropharyngeal exudate.  Eyes:     Conjunctiva/sclera: Conjunctivae normal.  Neck:     Thyroid:  No thyromegaly.     Trachea: Trachea normal. No tracheal tenderness or tracheal deviation.  Cardiovascular:     Rate and Rhythm: Normal rate and regular rhythm.     Heart sounds: Normal heart sounds, S1 normal and S2 normal. No murmur heard. Pulmonary:     Effort: No respiratory distress.     Breath sounds: Normal breath sounds. No stridor. No wheezing or rales.  Lymphadenopathy:     Head:     Right side of head: No tonsillar adenopathy.     Left side of head: No tonsillar adenopathy.     Cervical: No cervical adenopathy.  Skin:    Findings: No erythema or rash.     Nails: There is no clubbing.  Neurological:     Mental Status: He is alert.     Diagnostics:   Oxygen saturation was 98% on room air at rest.  Oxygen saturation was 97% on room air while walking the hallway.   Spirometry was performed and demonstrated an FEV1 of 1.66 at 74 % of predicted.  Results of a echocardiogram obtained 28 July 2016 identified the following:  - Left ventricle: Wall thickness was increased in a pattern    of moderate LVH. Systolic function was low normal with the    estimated ejection fraction was 50%,. Wall motion was    normal; there were no regional wall motion abnormalities.   - Aortic valve: Structurally normal valve. Trileaflet;    normal thickness leaflets. No  significant regurgitation.  - Aorta: Mild atherosclerotic disease in the descending    thoracic aorta.  - Mitral valve: Mild regurgitation.  - Left atrium: No evidence of thrombus in the atrial cavity    or appendage. No evidence of thrombus in the atrial cavity    or appendage. No evidence of thrombus in the appendage.  - Right ventricle: Systolic function was mildly reduced.  - Right atrium: The atrium was moderately dilated. No    evidence of thrombus in the atrial cavity or appendage. No    evidence of thrombus in the appendage.  - Atrial septum: No defect or patent foramen ovale was    identified.   Assessment and Plan:   1. Asthma, moderate persistent, well-controlled   2. Perennial allergic rhinitis   3. Dyspnea on exertion     1.  Continue Symbicort 160 - 2 inhalations 2 times per day w/ spacer (empty lungs)  2.  Continue Flonase - 1 spray each nostril 1-2  times per day    3.  Continue montelukast 10 mg - 1 tablet 1 time per day  4.  If needed:   A.  Albuterol HFA -2 inhalations every 4-6 hours  B.  OTC antihistamine  5.  Obtain a echocardiogram with cardiologist  6.  Return to clinic in 6 months or earlier if problem  7. 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  Should he develop recurrent flareups in the future we will consider starting him on a biologic agent.  He has very significant dyspnea on exertion.  I do not know if this is because of a cardiomyopathy or a upper limit on his ability to increase heart rate or an anemia or something else but I have referred him back  to his cardiologist with the suggestion that he have an echocardiogram and other evaluation for this issue.  Assuming he does well with the plan noted above I will see  him back in this clinic in 6 months or earlier if there is a problem.  Laurette Schimke, MD Allergy / Immunology [facility]

## 2021-08-30 NOTE — Patient Instructions (Addendum)
  1.  Continue Symbicort 160 - 2 inhalations 2 times per day w/ spacer (empty lungs)  2.  Continue Flonase - 1 spray each nostril 1-2  times per day    3.  Continue montelukast 10 mg - 1 tablet 1 time per day  4.  If needed:   A.  Albuterol HFA -2 inhalations every 4-6 hours  B.  OTC antihistamine  5.  Obtain a echocardiogram with cardiologist  6.  Return to clinic in 6 months or earlier if problem  7. Obtain fall flu vaccine and RSV vaccine

## 2021-08-31 ENCOUNTER — Encounter: Payer: Self-pay | Admitting: Allergy and Immunology

## 2021-09-06 ENCOUNTER — Ambulatory Visit
Admission: RE | Admit: 2021-09-06 | Discharge: 2021-09-06 | Disposition: A | Discharge status: Home / Self Care | Payer: Medicare Other | Source: Ambulatory Visit | Attending: Internal Medicine | Admitting: Internal Medicine

## 2021-09-06 ENCOUNTER — Other Ambulatory Visit: Payer: Self-pay | Admitting: Internal Medicine

## 2021-09-06 DIAGNOSIS — Z8701 Personal history of pneumonia (recurrent): Secondary | ICD-10-CM

## 2021-09-06 DIAGNOSIS — R918 Other nonspecific abnormal finding of lung field: Secondary | ICD-10-CM

## 2021-09-15 ENCOUNTER — Ambulatory Visit: Payer: Medicare Other | Admitting: Neurology

## 2021-10-17 ENCOUNTER — Telehealth: Payer: Self-pay | Admitting: Internal Medicine

## 2021-10-17 ENCOUNTER — Other Ambulatory Visit: Payer: Self-pay | Admitting: Interventional Cardiology

## 2021-10-17 NOTE — Telephone Encounter (Signed)
Prescription refill request for Eliquis received. Indication:Aflutter Last office visit:needs appointment Scr:1.3 Age: 87 Weight:81.6 kg  Prescription refilled

## 2021-10-17 NOTE — Telephone Encounter (Signed)
See my chart message

## 2021-10-17 NOTE — Telephone Encounter (Signed)
New Message:   Patient said he saw his primary doctor last week. He told him to call and get the reading off his device. He wanted to see how much life he had left on his Device.     1. Has your device fired? no  2. Is you device beeping? no  3. Are you experiencing draining or swelling at device site? no  4. Are you calling to see if we received your device transmission? no  5. Have you passed out? No- need the reading    Please route to Device Clinic Pool

## 2021-10-26 ENCOUNTER — Ambulatory Visit (INDEPENDENT_AMBULATORY_CARE_PROVIDER_SITE_OTHER): Payer: Medicare Other | Admitting: Neurology

## 2021-10-26 ENCOUNTER — Encounter: Payer: Self-pay | Admitting: Neurology

## 2021-10-26 VITALS — BP 165/94 | HR 73 | Ht 69.0 in | Wt 181.4 lb

## 2021-10-26 DIAGNOSIS — Z9989 Dependence on other enabling machines and devices: Secondary | ICD-10-CM

## 2021-10-26 DIAGNOSIS — Z789 Other specified health status: Secondary | ICD-10-CM | POA: Diagnosis not present

## 2021-10-26 DIAGNOSIS — G4731 Primary central sleep apnea: Secondary | ICD-10-CM | POA: Diagnosis not present

## 2021-10-26 NOTE — Patient Instructions (Signed)
It was very nice to see you again today.  You have done a great job adjusting to your new machine called ASV.  You are compliant with treatment and your central sleep apnea has come down beautifully, ie improved tremendously.  Your leak from the mask fluctuates.  I would like for you to try the sample masks I provided today which is a medium Evora under the nose style fullface mask and the Vitera fullface mask, both are from [facility] which is the maker of your current mask as well.   If you like any of these samples, you can request replacement masks from your supplier, [place].   Please follow-up routinely in 6 months, sooner if needed.

## 2021-10-26 NOTE — Progress Notes (Signed)
Subjective:    Patient ID: Isaiah Murphy is a 87 y.o. male.  HPI    Interim history:   Isaiah Murphy is an 87 year old right-handed gentleman with an underlying complex medical history of hypertension, hyperlipidemia, paroxysmal A-fib with status post cardioversion, sick sinus syndrome, history of syncope, status post pacemaker placement, asthma, allergic rhinitis, chronic constipation, reflux disease, osteoarthritis with status post bilateral hip replacements, and mildly overweight state, who presents for follow-up consultation of his sleep apnea after interim testing and starting treatment with ASV.  The patient is unaccompanied clinic.  I first met him at the request of his primary care physician on 06/06/2021, at which time he reported a prior diagnosis of sleep apnea.  He had an older CPAP machine.  [place].  He was advised to proceed with repeat sleep testing.  He had a baseline sleep study  on 06/14/2021 which showed severe primary central sleep apnea with an AHI of 50.3/h, O2 nadir 86%, almost exclusively with central respiratory events.  He was advised to return for a titration study.  He had a PAP titration on 07/04/2021 which was conducted with BiPAP therapy initially.  He was started on BiPAP of 8 over 4 cm and it was increased to 9 over 5 cm.  However, he had ongoing central sleep disordered breathing on standard BiPAP therapy and was switched to BiPAP ST, further titrated up to 11 over 7 cm.  He did not have significant improvement on BiPAP ST and was therefore switched to ASV with an EPAP of 5 cm and further titrated to an EPAP of 7 cm.  Final settings were EPAP of 7 cm, minimum pressure support of 3 cm, maximum pressure support of 15 cm with a residual AHI of 0/h, O2 nadir 92% with nonsupine REM sleep achieved.  He was advised to start home ASV.  His set up date was 08/11/2021.  He has a ResMed air curve 10 ASV machine.  Today, 10/26/2021: I reviewed his ASV  compliance data from 09/25/2021 through 10/24/2021, which is a total of 30 days, during which time he used his machine 28 days with percent use days greater than 4 hours at 77%, indicating good compliance with an average usage of 6 hours and 11 minutes, residual AHI at goal at 1.8/h, leak on the higher side with the 95th percentile at 18.6 L/min on a ASV of 7 cm, minimum pressure support of 3 cm, maximum pressure support of 15 cm.  He reports doing fairly well with his ASV, he has adjusted to treatment but it is still difficult for him to get a good seal on his fullface mask, he has a medium Simplus fullface mask from [employer].  Sometimes he pulls the mask off after using it for a few hours and sleeps without it.  He is motivated to continue with treatment and overall feels that it has helped.  He still has residual daytime fatigue and tiredness.  He continues to stay very active, he is preparing to give a speech at [facility].  He is willing to try another mask style.  I was able to provide him with 2 different samples from [employer], medium Vitera and medium Evora fullface masks.  The patient's allergies, current medications, family history, past medical history, past social history, past surgical history and problem list were reviewed and updated as appropriate.   Previously:   06/06/21: (He) was previously diagnosed with obstructive sleep apnea and placed on PAP  therapy.  Prior sleep study results are not available for my review today, study was out of state, as per his recollection, study was at least 3 years ago, when he was residing in [place].  He moved to [place] in 2020.  He does not have a current DME company.  He has not really used his CPAP consistently but admits that it was helpful and helped him feel better rested and more energized during the day.  He is motivated to get back on track with his treatment.  He does not recall if he had mild to moderate or severe sleep apnea at  the time.  His weight has been more or less stable, bedtime generally between 10 and 11 PM and rise time around 7 AM.  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  He then moved to [place] and worked for the [employer].  He lives with his wife, they have 2 grown children, younger daughter lives in [place] and older daughter lives in [place], she is Clinical biochemist of operations and special events with the [employer].  [employer].  I reviewed your office note from 2/32023.  His Epworth sleepiness score is 14 out of 24, fatigue severity score is 52 out of 63.  He has nocturia about twice per average night, denies recurrent morning headaches.  He is not aware of any family history of sleep apnea.  He is a non-smoker and drinks alcohol very occasionally.  I reviewed compliance data, in the past month he used his machine once.  He admits that he has not been using his machine and just used it here and there.  He was affected by the [employer] recall and received a replacement unit about 6 months ago as he recalls.  He does not have a TV in his bedroom, no pets in the household.  His Past Medical History Is Significant For: Past Medical History:  Diagnosis Date   Acute blood loss anemia    Allergic rhinitis    Arthritis    Asthma    Chronic anticoagulation    Constipation    Dyslipidemia    Dysrhythmia    history of paroxysmal A Fib and typical A Flutter    GERD (gastroesophageal reflux disease)    History of bronchitis    History of sick sinus syndrome    History of syncope    HLD (hyperlipidemia)    Hx of syncope    Hypertension    Hyponatremia    Leukocytosis    Moderate persistent asthma with acute exacerbation in adult    Osteoarthritis    s/p R & L THR   Presence of permanent cardiac pacemaker    Primary osteoarthritis of right hip    Sleep apnea    pt states  not currently using CPAP machine    Unsteady gait     His Past Surgical History Is Significant For: Past Surgical History:  Procedure Laterality Date   CARDIOVERSION N/A 04/28/2013   Procedure: CARDIOVERSION;  Surgeon: Dorothy Spark, MD;  Location: [facility];  Service: Cardiovascular;  Laterality: N/A;   HEMORRHOID SURGERY     left shoulder surgery      secondary to torn ligament / subluxation   TEE WITHOUT CARDIOVERSION N/A 04/28/2013   Procedure: TRANSESOPHAGEAL ECHOCARDIOGRAM (TEE);  Surgeon: Dorothy Spark, MD;  Location: [facility]  ENDOSCOPY;  Service: Cardiovascular;  Laterality: N/A;   TOTAL HIP ARTHROPLASTY Right 03/08/2015   Procedure: RIGHT TOTAL HIP ARTHROPLASTY ANTERIOR APPROACH;  Surgeon: Gaynelle Arabian, MD;  Location: [facility];  Service: Orthopedics;  Laterality: Right;   TOTAL HIP ARTHROPLASTY Left 01/07/2020   Procedure: TOTAL HIP ARTHROPLASTY ANTERIOR APPROACH;  Surgeon: Gaynelle Arabian, MD;  Location: [facility];  Service: Orthopedics;  Laterality: Left;  113mn    His Family History Is Significant For: Family History  Problem Relation Age of Onset   Hypertension Father    Allergic rhinitis Neg Hx    Asthma Neg Hx    Eczema Neg Hx    Immunodeficiency Neg Hx    Urticaria Neg Hx    Sleep apnea Neg Hx     His Social History Is Significant For: Social History   Socioeconomic History   Marital status: Married    Spouse name: Not on file   Number of children: Not on file   Years of education: Not on file   Highest education level: Not on file  Occupational History   Not on file  Tobacco Use   Smoking status: Never   Smokeless tobacco: Never  Vaping Use   Vaping Use: Never used  Substance and Sexual Activity   Alcohol use: Yes    Alcohol/week: 0.0 standard drinks of alcohol    Comment: socially    Drug use: No   Sexual activity: Not on file  Other Topics Concern   Not on file  Social History Narrative   ** Merged History Encounter **       Married, 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: Not on file  Transportation Needs: Not on file  Physical Activity: Not on file  Stress: Not on file  Social Connections: Not on file    His Allergies Are:  Allergies  Allergen Reactions   Penicillins Rash    Has patient had a PCN reaction causing immediate rash, facial/tongue/throat swelling, SOB or lightheadedness with hypotension: Yes Has patient had a PCN reaction causing severe rash involving mucus membranes or skin necrosis: Yes Has patient had a PCN reaction that required hospitalization: No Has patient had a PCN reaction occurring within the last 10 years: No If all of the above answers are "NO", then may proceed with Cephalosporin use. Rash at the injection site  :   His Current Medications Are:  Outpatient Encounter Medications as of 10/26/2021  Medication Sig   albuterol (VENTOLIN HFA) 108 (90 Base) MCG/ACT inhaler Inhale 2 puffs into the lungs every 4 (four) hours as needed for wheezing or shortness of breath.   apixaban (ELIQUIS) 5 MG TABS tablet Take 1 tablet (5 mg total) by mouth 2 (two) times daily. Needs Cardiology Appointment. Please call office to schedule   atorvastatin (LIPITOR) 20 MG tablet Take 20 mg by mouth at bedtime.    budesonide-formoterol (SYMBICORT) 160-4.5 MCG/ACT inhaler Inhale 2 puffs into the lungs 2 (two) times daily.   ciclesonide (ALVESCO) 160 MCG/ACT inhaler USE 1 INHALATION ORALLY TWICE DAILY TO PREVENT COUGH OR WHEEZE. INCREASE TO USE 2 INHALATIONS ORALLY TWICE DAILY WITH ASTHMA FLARE. RINSE, GARGLE AND SPIT AFTER USE.   dextromethorphan-guaiFENesin (MUCINEX DM) 30-600 MG 12hr tablet Take 1 tablet by mouth 2 (two) times daily.   esomeprazole (NEXIUM) 40 MG capsule Take 40 mg by mouth daily as needed (reflux).   fluticasone (FLONASE) 50  MCG/ACT nasal spray Place 1 spray into both nostrils 2 (two) times daily as needed  for allergies or rhinitis.   ketorolac (ACULAR) 0.5 % ophthalmic solution 1 drop. Affected eye as needed   levocetirizine (XYZAL) 5 MG tablet Take 1 tablet (5 mg total) by mouth daily as needed (Can take an extra dose during flare ups.).   montelukast (SINGULAIR) 10 MG tablet Take 1 tablet (10 mg total) by mouth at bedtime.   Multiple Vitamins-Minerals (MEGA MULTI MEN) TABS Take 1 tablet by mouth as needed.   olopatadine (PATANOL) 0.1 % ophthalmic solution Place 1 drop into both eyes daily.    Spacer/Aero-Holding Chambers DEVI 1 Device by Does not apply route as directed.   triamcinolone (KENALOG) 0.1 % Apply 1 application topically 2 (two) times daily as needed (itching).   No facility-administered encounter medications on file as of 10/26/2021.  :  Review of Systems:  Out of a complete 14 point review of systems, all are reviewed and negative with the exception of these symptoms as listed below:  Review of Systems  Neurological:        Pt here for CPAP f/u  Pt states have some issue with mask Pt states mask leaking air and at times he is still fatigue after sleeping 9 hours      ESS:16    Objective:  Neurological Exam  Physical Exam Physical Examination:   Vitals:   10/26/21 0746  BP: (!) 165/94  Pulse: 73    General Examination: The patient is a very pleasant 87 y.o. male in no acute distress. He appears well-developed and well-nourished and well groomed.   HEENT: Normocephalic, atraumatic, pupils are equal, round and reactive to light, extraocular tracking is good without limitation to gaze excursion or nystagmus noted. Hearing is grossly intact. Face is symmetric with normal facial animation. Speech is clear with no dysarthria noted. There is no hypophonia. There is no lip, neck/head, jaw or voice tremor. Neck is supple with full range of passive and active motion. There are no carotid bruits on auscultation. Oropharynx exam reveals: mild mouth dryness, adequate dental  hygiene and moderate airway crowding, due to small airway entry, redundant soft palate, tonsils on the smaller side, neck circumference 17 inches, minimal overbite.  Tongue protrudes centrally and palate elevates symmetrically.   Chest: Clear to auscultation without wheezing, rhonchi or crackles noted.   Heart: S1+S2+0, regular and normal without murmurs, rubs or gallops noted.    Abdomen: Soft, non-tender and non-distended.   Extremities: There is no pitting edema in the distal lower extremities bilaterally.    Skin: Warm and dry without trophic changes noted.    Musculoskeletal: exam reveals no obvious joint deformities.  He is status post bilateral hip replacements.   Neurologically:  Mental status: The patient is awake, alert and oriented in all 4 spheres. His immediate and remote memory, attention, language skills and fund of knowledge are appropriate. There is no evidence of aphasia, agnosia, apraxia or anomia. Speech is clear with normal prosody and enunciation. Thought process is linear. Mood is normal and affect is normal.  Cranial nerves II - XII are as described above under HEENT exam.  Motor exam: Normal bulk, strength and tone is noted. There is no obvious tremor. Fine motor skills and coordination: grossly intact.  Cerebellar testing: No dysmetria or intention tremor. There is no truncal or gait ataxia.  Sensory exam: intact to light touch in the upper and lower extremities.  Gait, station and balance: He  stands easily. No veering to one side is noted. No leaning to one side is noted. Posture is age-appropriate and stance is narrow based. Gait shows normal stride length and normal pace. No problems turning are noted.    Assessment and Plan:  In summary, Isaiah Murphy is a very pleasant 87 year old male with an underlying complex medical history of hypertension, hyperlipidemia, paroxysmal A-fib with status post cardioversion, sick sinus syndrome, history of syncope, status post  pacemaker placement, asthma, allergic rhinitis, chronic constipation, reflux disease, osteoarthritis with status post bilateral hip replacements, and mildly overweight state, who presents for follow-up consultation of his sleep apnea after interim testing and starting treatment with ASV.  His baseline sleep study from 06/14/2021 showed severe primary central sleep apnea with an AHI of 50.3/h, O2 nadir 86%.  He had a titration study shortly thereafter on 07/04/2021, at which time his central sleep apnea was not controlled with standard CPAP or standard BiPAP therapy, nor with BiPAP ST.  He did reasonably well with ASV.  He has been on ASV treatment since 08/11/2021 and is generally compliant with treatment but is struggling with mask fit. He is willing to try another mask style.  I was able to provide him with 2 different samples from [employer], medium Vitera and medium Evora fullface masks.  He is highly commended for his treatment adherence, residual AHI is much improved and at goal.  Leak is overall acceptable but tends to fluctuate quite a bit.  He is willing to try another style of fullface mask.  He is encouraged to keep [place] updated as far as his progress in the interim, he is advised to follow-up routinely in sleep clinic in 6 months and if he does well at the time, we can see him yearly thereafter.  We talked about his sleep study results and reviewed his compliance data in detail today.  I answered all his questions today and he was in agreement with our plan.   I spent 40 minutes in total face-to-face time and in reviewing records during pre-charting, more than 50% of which was spent in counseling and coordination of care, reviewing test results, reviewing medications and treatment regimen and/or in discussing or reviewing the diagnosis of CSA, the prognosis and treatment options. Pertinent laboratory and imaging test results that were available during this visit with the patient were reviewed by me and  considered in my medical decision making (see chart for details).

## 2021-10-27 NOTE — Progress Notes (Signed)
Fax confirmation received for [facility] and pts recent ofv note.

## 2021-11-03 ENCOUNTER — Other Ambulatory Visit: Payer: Self-pay

## 2021-11-03 ENCOUNTER — Ambulatory Visit (INDEPENDENT_AMBULATORY_CARE_PROVIDER_SITE_OTHER): Payer: Medicare Other | Admitting: Allergy & Immunology

## 2021-11-03 ENCOUNTER — Encounter: Payer: Self-pay | Admitting: Allergy & Immunology

## 2021-11-03 VITALS — BP 126/84 | HR 83 | Temp 98.1°F | Resp 16 | Ht 69.0 in

## 2021-11-03 DIAGNOSIS — J4540 Moderate persistent asthma, uncomplicated: Secondary | ICD-10-CM | POA: Diagnosis not present

## 2021-11-03 DIAGNOSIS — J3089 Other allergic rhinitis: Secondary | ICD-10-CM

## 2021-11-03 MED ORDER — BREZTRI AEROSPHERE 160-9-4.8 MCG/ACT IN AERO: 2.0000 | INHALATION_SPRAY | Freq: Two times a day (BID) | RESPIRATORY_TRACT | 5 refills | Status: AC

## 2021-11-03 MED ORDER — PREDNISONE 10 MG PO TABS: ORAL_TABLET | ORAL | 0 refills | Status: DC

## 2021-11-03 NOTE — Progress Notes (Signed)
FOLLOW UP  Date of Service/Encounter:  11/03/21   Assessment:   Moderate persistent asthma, uncomplicated  Perennial allergic rhinitis  Dyspnea on exertion  Plan/Recommendations:   1. Moderate persistent asthma, uncomplicated - Lung testing looked stable today. - I do think that we need to add on a low dose prednisone today to get things under better control.  - We need to consider biologics as add on for your breathing control (Xolair or anti-IgE, Nucala or Fasenra for anti-eosinophil activity or Dupixent). - Information on these provided today. - For now, start Breztri two puffs twice daily (instead of Symbicort).  Markus Daft contains THREE medications to help with asthma, including Symbicort plus a third medicine to help relax the smooth muscles in the lungs).  - Daily controller medication(s): Breztri two puffs twice daily with spacer  - Prior to physical activity: albuterol 2 puffs 10-15 minutes before physical activity. - Rescue medications: albuterol 4 puffs every 4-6 hours as needed - Asthma control goals:  * Full participation in all desired activities (may need albuterol before activity) * Albuterol use two time or less a week on average (not counting use with activity) * Cough interfering with sleep two time or less a month * Oral steroids no more than once a year * No hospitalizations  2. Perennial allergic rhinitis - Continue with Flonase one spray per nostril daily.  - Continue with Singulair 10mg  daily. - Continue with an over the counter antihistamine as needed.  3. Return in about 6 weeks (around 12/15/2021).   Subjective:   Isaiah Murphy is a 87 y.o. male presenting today for follow up of  Chief Complaint  Patient presents with   Cough    Some cough and would like his throat checked     Isaiah Murphy has a history of the following: Patient Active Problem List   Diagnosis Date Noted   Status post total hip replacement, left 01/08/2020   Primary  osteoarthritis of left hip 01/07/2020   Tachycardia-bradycardia syndrome (HCC) 01/01/2019   Pacemaker 01/01/2019   OA (osteoarthritis) of hip 03/08/2015   Asthma 02/04/2015   Atrial flutter (HCC) 04/25/2013   Near syncope 04/25/2013   HTN (hypertension) 04/25/2013   Dyslipidemia 04/25/2013    History obtained from: chart review and patient.  Isaiah Murphy is a 87 y.o. male presenting for a follow up visit.  He was last seen in July 2023.  At that time, we continue with Symbicort 160 mcg 2 puffs twice daily as well as Singulair 10 mg daily.  For his rhinitis, he was continued on Flonase as well as an over-the-counter antihistamine.  Dr. August 2023 recommended that he get an echocardiogram.  Since the last visit, he has done well.   Asthma/Respiratory Symptom History: He has been having a dry cough for a couple of days. He denies fever. He has tried using albuterol and it does help.  He has been using Symbicort two puffs twice daily with a spacer. He did have some slacking off of the Symbicort before the weather changed. He thinks that he might have missed around one afternoon or so. He might have missed two nights in a row. But he would take the morning doses. He thinks that his Symbicort is affordable. He normally needs prednisone around 1-2 times per year for his breathing. He does feel that he has been slower about getting back into his routine. He was playing tennis often and they cut back on that because of some hip  issues. These are now fixed but his breathing has not been great. He dos feel that he cannot keep up with tennis now.   Allergic Rhinitis Symptom History: Rhinitis has been under good control with the Flonase and an over-the-counter antihistamine.  He also remains on the Singulair.  He has not needed antibiotics at all since last visit.  He continues to do some intermittent work with the [employer].  This is where he worked for 20 years or so.  He is officially retired  now.  Otherwise, there have been no changes to his past medical history, surgical history, family history, or social history.    Review of Systems  Constitutional: Negative.  Negative for chills, fever, malaise/fatigue and weight loss.  HENT: Negative.  Negative for congestion, ear discharge, ear pain and sinus pain.   Eyes:  Negative for pain, discharge and redness.  Respiratory:  Positive for cough and wheezing. Negative for sputum production and shortness of breath.   Cardiovascular: Negative.  Negative for chest pain and palpitations.  Gastrointestinal:  Negative for abdominal pain, constipation, diarrhea, heartburn, nausea and vomiting.  Skin: Negative.  Negative for itching and rash.  Neurological:  Negative for dizziness and headaches.  Endo/Heme/Allergies:  Negative for environmental allergies. Does not bruise/bleed easily.       Objective:   Blood pressure 126/84, pulse 83, temperature 98.1 F (36.7 C), resp. rate 16, height 5\' 9"  (1.753 m), SpO2 96 %. Body mass index is 26.79 kg/m.    Physical Exam Vitals reviewed.  Constitutional:      Appearance: He is well-developed.  HENT:     Head: Normocephalic and atraumatic.     Right Ear: Tympanic membrane, ear canal and external ear normal.     Left Ear: Tympanic membrane, ear canal and external ear normal.     Nose: No nasal deformity, septal deviation, mucosal edema or rhinorrhea.     Right Turbinates: Enlarged and swollen.     Left Turbinates: Enlarged and swollen.     Right Sinus: No maxillary sinus tenderness or frontal sinus tenderness.     Left Sinus: No maxillary sinus tenderness or frontal sinus tenderness.     Mouth/Throat:     Lips: Pink.     Mouth: Mucous membranes are moist. Mucous membranes are not pale and not dry.     Pharynx: Uvula midline.     Comments: Cobblestoning in the posterior oropharynx.  Eyes:     General: Lids are normal. No allergic shiner.       Right eye: No discharge.        Left  eye: No discharge.     Conjunctiva/sclera: Conjunctivae normal.     Right eye: Right conjunctiva is not injected. No chemosis.    Left eye: Left conjunctiva is not injected. No chemosis.    Pupils: Pupils are equal, round, and reactive to light.  Cardiovascular:     Rate and Rhythm: Normal rate and regular rhythm.     Heart sounds: Normal heart sounds.  Pulmonary:     Effort: Pulmonary effort is normal. No tachypnea, accessory muscle usage or respiratory distress.     Breath sounds: Normal breath sounds. No wheezing, rhonchi or rales.     Comments: Decreased air movement at the bases.  Chest:     Chest wall: No tenderness.  Lymphadenopathy:     Cervical: No cervical adenopathy.  Skin:    Coloration: Skin is not pale.     Findings: No abrasion,  erythema, petechiae or rash. Rash is not papular, urticarial or vesicular.  Neurological:     Mental Status: He is alert.  Psychiatric:        Behavior: Behavior is cooperative.      Diagnostic studies:    Spirometry: results abnormal (FEV1: 1.52/60%, FVC: 2.57/71%, FEV1/FVC: 59%).    Spirometry consistent with mixed obstructive and restrictive disease.    Allergy Studies: none        Malachi Bonds, MD  [facility]

## 2021-11-03 NOTE — Patient Instructions (Addendum)
1. Moderate persistent asthma, uncomplicated - Lung testing looked stable today. - I do think that we need to add on a low dose prednisone today to get things under better control.  - We need to consider biologics as add on for your breathing control (Xolair or anti-IgE, Nucala or Fasenra for anti-eosinophil activity). - Information on these provided today. - For now, start Breztri two puffs twice daily (instead of Symbicort).  Judithann Sauger contains THREE medications to help with asthma, including Symbicort plus a third medicine to help relax the smooth muscles in the lungs).  - Daily controller medication(s): Breztri two puffs twice daily with spacer  - Prior to physical activity: albuterol 2 puffs 10-15 minutes before physical activity. - Rescue medications: albuterol 4 puffs every 4-6 hours as needed - Asthma control goals:  * Full participation in all desired activities (may need albuterol before activity) * Albuterol use two time or less a week on average (not counting use with activity) * Cough interfering with sleep two time or less a month * Oral steroids no more than once a year * No hospitalizations  2. Perennial allergic rhinitis - Continue with Flonase one spray per nostril daily.  - Continue with Singulair 10mg  daily. - Continue with an over the counter antihistamine as needed.  3. Return in about 6 weeks (around 12/15/2021).    Please inform us of any Emergency Department visits, hospitalizations, or changes in symptoms. Call us before going to the ED for breathing or allergy symptoms since we might be able to fit you in for a sick visit. Feel free to contact us anytime with any questions, problems, or concerns.  It was a pleasure to meet you in person today!  Websites that have reliable patient information: 1. American Academy of Asthma, Allergy, and Immunology: www.aaaai.org 2. Food Allergy Research and Education (FARE): foodallergy.org 3. Mothers of Asthmatics:  http://www.asthmacommunitynetwork.org 4. American College of Allergy, Asthma, and Immunology: www.acaai.org   COVID-19 Vaccine Information can be found at: ShippingScam.co.uk For questions related to vaccine distribution or appointments, please email vaccine@[email or web address] .com or call [phone].   We realize that you might be concerned about having an allergic reaction to the COVID19 vaccines. To help with that concern, WE ARE OFFERING THE COVID19 VACCINES IN OUR OFFICE! Ask the front desk for dates!     "Like" [place] on Facebook and Instagram for our latest updates!      A healthy democracy works best when [employer] participate! Make sure you are registered to vote! If you have moved or changed any of your contact information, you will need to get this updated before voting!  In some cases, you MAY be able to register to vote online: [email or web address]

## 2021-11-06 LAB — CBC WITH DIFFERENTIAL
Basophils Absolute: 0.1 10*3/uL (ref 0.0–0.2)
Basos: 1 %
EOS (ABSOLUTE): 0.1 10*3/uL (ref 0.0–0.4)
Eos: 2 %
Hematocrit: 46.6 % (ref 37.5–51.0)
Hemoglobin: 16.4 g/dL (ref 13.0–17.7)
Immature Grans (Abs): 0 10*3/uL (ref 0.0–0.1)
Immature Granulocytes: 1 %
Lymphocytes Absolute: 1.3 10*3/uL (ref 0.7–3.1)
Lymphs: 16 %
MCH: 33.6 pg — ABNORMAL HIGH (ref 26.6–33.0)
MCHC: 35.2 g/dL (ref 31.5–35.7)
MCV: 96 fL (ref 79–97)
Monocytes Absolute: 1.5 10*3/uL — ABNORMAL HIGH (ref 0.1–0.9)
Monocytes: 19 %
Neutrophils Absolute: 5.1 10*3/uL (ref 1.4–7.0)
Neutrophils: 61 %
RBC: 4.88 x10E6/uL (ref 4.14–5.80)
RDW: 12.9 % (ref 11.6–15.4)
WBC: 8.1 10*3/uL (ref 3.4–10.8)

## 2021-11-06 LAB — IGE: IgE (Immunoglobulin E), Serum: 126 IU/mL (ref 6–495)

## 2021-11-07 ENCOUNTER — Ambulatory Visit (INDEPENDENT_AMBULATORY_CARE_PROVIDER_SITE_OTHER): Payer: Medicare Other

## 2021-11-07 DIAGNOSIS — I442 Atrioventricular block, complete: Secondary | ICD-10-CM

## 2021-11-08 ENCOUNTER — Telehealth: Payer: Self-pay

## 2021-11-08 ENCOUNTER — Ambulatory Visit: Payer: Federal, State, Local not specified - PPO | Admitting: Allergy and Immunology

## 2021-11-08 LAB — CUP PACEART REMOTE DEVICE CHECK
Battery Remaining Longevity: 4 mo
Battery Voltage: 2.86 V
Brady Statistic RA Percent Paced: 0.08 %
Brady Statistic RV Percent Paced: 99.92 %
Date Time Interrogation Session: 20231002112203
Implantable Lead Implant Date: 20150413
Implantable Lead Implant Date: 20150413
Implantable Lead Location: 753859
Implantable Lead Location: 753860
Implantable Lead Model: 5076
Implantable Lead Model: 5076
Implantable Pulse Generator Implant Date: 20150413
Lead Channel Impedance Value: 361 Ohm
Lead Channel Impedance Value: 418 Ohm
Lead Channel Impedance Value: 437 Ohm
Lead Channel Impedance Value: 513 Ohm
Lead Channel Pacing Threshold Amplitude: 0.375 V
Lead Channel Pacing Threshold Amplitude: 0.75 V
Lead Channel Pacing Threshold Pulse Width: 0.4 ms
Lead Channel Pacing Threshold Pulse Width: 0.4 ms
Lead Channel Sensing Intrinsic Amplitude: 0.875 mV
Lead Channel Sensing Intrinsic Amplitude: 0.875 mV
Lead Channel Sensing Intrinsic Amplitude: 11.75 mV
Lead Channel Sensing Intrinsic Amplitude: 11.75 mV
Lead Channel Setting Pacing Amplitude: 1.5 V
Lead Channel Setting Pacing Amplitude: 2.5 V
Lead Channel Setting Pacing Pulse Width: 0.4 ms
Lead Channel Setting Sensing Sensitivity: 0.9 mV

## 2021-11-08 NOTE — Telephone Encounter (Signed)
Outreach made to Pt.  Advised of alert received.  Advised he has been scheduled for monthly remote checks to keep a close eye on his PPM battery life.  All questions answered.  Scheduled remote reviewed. Normal device function.   Battery estimated 41mo Known AF, controlled rates, Eliquis

## 2021-11-10 NOTE — Progress Notes (Signed)
Cardiology Office Note:    Date:  11/11/2021   ID:  Isaiah Murphy, DOB 04/15/1934, MRN [id number]  PCP:  Creola Corn, MD  Cardiologist:  Lesleigh Noe, MD   Referring MD: Creola Corn, MD   Chief Complaint  Patient presents with   Follow-up    Tachycardia-bradycardia syndrome    History of Present Illness:    Isaiah Murphy is a 87 y.o. 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  Most recent LVEF by echocardiogram 2018 ->60%.  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  Recently alerted that pacemaker battery life is less than 6 months.  He has been contacted by device clinic.  He is scheduled to see Dr. Ladona Ridgel in December.  No cardiac complaints.  He has been having some shortness of breath.  This is a typical seasonal asthma complaint.  He is being managed by his allergist and primary care physician for this.  He does not feel this has anything to do with his heart.  No chest pain, palpitations, or syncope.   Past Medical History:  Diagnosis Date   Acute blood loss anemia    Allergic rhinitis    Arthritis    Asthma    Chronic anticoagulation    Constipation    Dyslipidemia    Dysrhythmia    history of paroxysmal A Fib and typical A Flutter    GERD (gastroesophageal reflux disease)    History of bronchitis    History of sick sinus syndrome    History of syncope    HLD (hyperlipidemia)    Hx of syncope    Hypertension    Hyponatremia    Leukocytosis    Moderate persistent asthma with acute exacerbation in adult    Osteoarthritis    s/p R & L THR   Presence of permanent cardiac pacemaker    Primary osteoarthritis of right hip    Sleep apnea    pt states not currently using CPAP machine    Unsteady gait     Past Surgical History:  Procedure Laterality Date    CARDIOVERSION N/A 04/28/2013   Procedure: CARDIOVERSION;  Surgeon: Lars Masson, MD;  Location: [facility] ENDOSCOPY;  Service: Cardiovascular;  Laterality: N/A;   HEMORRHOID SURGERY     left shoulder surgery      secondary to torn ligament / subluxation   TEE WITHOUT CARDIOVERSION N/A 04/28/2013   Procedure: TRANSESOPHAGEAL ECHOCARDIOGRAM (TEE);  Surgeon: Lars Masson, MD;  Location: [facility] ENDOSCOPY;  Service: Cardiovascular;  Laterality: N/A;   TOTAL HIP ARTHROPLASTY Right 03/08/2015   Procedure: RIGHT TOTAL HIP ARTHROPLASTY ANTERIOR APPROACH;  Surgeon: Ollen Gross, MD;  Location: [facility] ORS;  Service: Orthopedics;  Laterality: Right;   TOTAL HIP ARTHROPLASTY Left 01/07/2020   Procedure: TOTAL HIP ARTHROPLASTY ANTERIOR APPROACH;  Surgeon: Ollen Gross, MD;  Location: [facility] ORS;  Service: Orthopedics;  Laterality: Left;     Current Medications: Current Meds  Medication Sig   albuterol (VENTOLIN HFA) 108 (90 Base) MCG/ACT inhaler Inhale 2 puffs into the lungs every 4 (four) hours as needed for wheezing or shortness of breath.   apixaban (ELIQUIS) 5 MG TABS tablet Take 1 tablet (5 mg total) by mouth 2 (two) times daily. Needs Cardiology Appointment. Please call office to schedule   atorvastatin (LIPITOR) 20 MG tablet Take 20 mg by mouth at bedtime.  Budeson-Glycopyrrol-Formoterol (BREZTRI AEROSPHERE) 160-9-4.8 MCG/ACT AERO Inhale 2 puffs into the lungs in the morning and at bedtime.   budesonide-formoterol (SYMBICORT) 160-4.5 MCG/ACT inhaler Inhale 2 puffs into the lungs 2 (two) times daily.   ciclesonide (ALVESCO) 160 MCG/ACT inhaler USE 1 INHALATION ORALLY TWICE DAILY TO PREVENT COUGH OR WHEEZE. INCREASE TO USE 2 INHALATIONS ORALLY TWICE DAILY WITH ASTHMA FLARE. RINSE, GARGLE AND SPIT AFTER USE.   dextromethorphan-guaiFENesin (MUCINEX DM) 30-600 MG 12hr tablet Take 1 tablet by mouth 2 (two) times daily.   esomeprazole (NEXIUM) 40 MG capsule Take 40 mg by mouth daily as needed (reflux).    fluticasone (FLONASE) 50 MCG/ACT nasal spray Place 1 spray into both nostrils 2 (two) times daily as needed for allergies or rhinitis.   ketorolac (ACULAR) 0.5 % ophthalmic solution 1 drop. Affected eye as needed   levocetirizine (XYZAL) 5 MG tablet Take 1 tablet (5 mg total) by mouth daily as needed (Can take an extra dose during flare ups.).   montelukast (SINGULAIR) 10 MG tablet Take 1 tablet (10 mg total) by mouth at bedtime.   Multiple Vitamins-Minerals (MEGA MULTI MEN) TABS Take 1 tablet by mouth as needed.   olopatadine (PATANOL) 0.1 % ophthalmic solution Place 1 drop into both eyes daily.    Spacer/Aero-Holding Chambers DEVI 1 Device by Does not apply route as directed.   triamcinolone (KENALOG) 0.1 % Apply 1 application topically 2 (two) times daily as needed (itching).     Allergies:   Penicillins   Social History   Socioeconomic History   Marital status: Married    Spouse name: Not on file   Number of children: Not on file   Years of education: Not on file   Highest education level: Not on file  Occupational History   Not on file  Tobacco Use   Smoking status: Never   Smokeless tobacco: Never  Vaping Use   Vaping Use: Never used  Substance and Sexual Activity   Alcohol use: Yes    Alcohol/week: 0.0 standard drinks of alcohol    Comment: socially    Drug use: No   Sexual activity: Not on file  Other Topics Concern   Not on file  Social History Narrative   ** Merged History Encounter **       Married, Scientist, research (medical) in [place]   Social Determinants of Corporate investment banker Strain: Not on [employer] Insecurity: Not on file  Transportation Needs: Not on file  Physical Activity: Not on file  Stress: Not on file  Social Connections: Not on file     Family History: The patient's family history includes Hypertension in his father. There is no history of Allergic rhinitis, Asthma, Eczema, Immunodeficiency, Urticaria, or Sleep  apnea.  ROS:   Please see the history of present illness.    Some difficulty with ambulating.  States his daughters are demanding that he have help around the house so that he does not have any unexpected falls or difficulty.  All other systems reviewed and are negative.  EKGs/Labs/Other Studies Reviewed:    The following studies were reviewed today:  Pacemaker device download 11/08/2021 Conclusion  Scheduled remote reviewed. Normal device function.    Battery estimated 89mo  Known AF, controlled rates, Eliquis  Next remote to be determined - start IFU per protocol    EKG:  EKG currently pacing at 78 bpm.  Background atrial fibrillation.  On the last EKG from February 2022, he was AV  sequentially pacing at 72 bpm.  Recent Labs: 11/03/2021: Hemoglobin 16.4  Recent Lipid Panel    Component Value Date/Time   CHOL 108 04/26/2013 0100   TRIG 52 04/26/2013 0100   HDL 36 (L) 04/26/2013 0100   CHOLHDL 3.0 04/26/2013 0100   VLDL 10 04/26/2013 0100   LDLCALC 62 04/26/2013 0100    Physical Exam:    VS:  BP 118/78   Pulse 78   Ht 5\' 9"  (1.753 m)   Wt 178 lb 3.2 oz (80.8 kg)   SpO2 97%   BMI 26.32 kg/m     Wt Readings from Last 3 Encounters:  11/11/21 178 lb 3.2 oz (80.8 kg)  10/26/21 181 lb 6.4 oz (82.3 kg)  08/30/21 180 lb (81.6 kg)     GEN: Vision in the stated age. No acute distress HEENT: Normal NECK: No JVD. LYMPHATICS: No lymphadenopathy CARDIAC: No murmur. RRR no gallop, or edema. VASCULAR:  Normal Pulses. No bruits. RESPIRATORY:  Clear to auscultation without rales, wheezing or rhonchi  ABDOMEN: Soft, non-tender, non-distended, No pulsatile mass, MUSCULOSKELETAL: No deformity  SKIN: Warm and dry NEUROLOGIC:  Alert and oriented x 3 PSYCHIATRIC:  Normal affect   ASSESSMENT:    1. Tachycardia-bradycardia syndrome ([place])   2. Complete heart block ([place])   3. Primary hypertension   4. Pacemaker    PLAN:    In order of problems listed above:  Needs battery  change out.  We will follow-up with device clinic to make sure that he does not slip through the cracks. Treated with permanent pacemaker Blood pressure control is excellent on current regimen. See dialogue above.  Clinical follow-up with general cardiology in 1 year.  Maintain relationship with the device clinic which is his major CV provider and requirement for follow-up.   Medication Adjustments/Labs and Tests Ordered: Current medicines are reviewed at length with the patient today.  Concerns regarding medicines are outlined above.  No orders of the defined types were placed in this encounter.  No orders of the defined types were placed in this encounter.   Patient Instructions  Medication Instructions:  Your physician recommends that you continue on your current medications as directed. Please refer to the Current Medication list given to you today.  *If you need a refill on your cardiac medications before your next appointment, please call your pharmacy*  Lab Work: NONE  Testing/Procedures: NONE  Follow-Up: At [facility], you and your health needs are our priority.  As part of our continuing mission to provide you with exceptional heart care, we have created designated Provider Care Teams.  These Care Teams include your primary Cardiologist (physician) and Advanced Practice Providers (APPs -  Physician Assistants and Nurse Practitioners) who all work together to provide you with the care you need, when you need it.  Your next appointment:   1 year(s)  The format for your next appointment:   In Person  Provider:   Sinclair Grooms, MD    Important Information About Sugar         Signed, Sinclair Grooms, MD  11/11/2021 9:52 AM    [place]

## 2021-11-11 ENCOUNTER — Encounter: Payer: Self-pay | Admitting: Interventional Cardiology

## 2021-11-11 ENCOUNTER — Ambulatory Visit: Payer: Medicare Other | Attending: Interventional Cardiology | Admitting: Interventional Cardiology

## 2021-11-11 VITALS — BP 118/78 | HR 78 | Ht 69.0 in | Wt 178.2 lb

## 2021-11-11 DIAGNOSIS — I442 Atrioventricular block, complete: Secondary | ICD-10-CM | POA: Insufficient documentation

## 2021-11-11 DIAGNOSIS — Z950 Presence of cardiac pacemaker: Secondary | ICD-10-CM | POA: Diagnosis present

## 2021-11-11 DIAGNOSIS — I10 Essential (primary) hypertension: Secondary | ICD-10-CM | POA: Diagnosis present

## 2021-11-11 DIAGNOSIS — I495 Sick sinus syndrome: Secondary | ICD-10-CM | POA: Insufficient documentation

## 2021-11-11 NOTE — Patient Instructions (Signed)
Medication Instructions:  Your physician recommends that you continue on your current medications as directed. Please refer to the Current Medication list given to you today.  *If you need a refill on your cardiac medications before your next appointment, please call your pharmacy*  Lab Work: NONE  Testing/Procedures: NONE  Follow-Up: At [facility], you and your health needs are our priority.  As part of our continuing mission to provide you with exceptional heart care, we have created designated Provider Care Teams.  These Care Teams include your primary Cardiologist (physician) and Advanced Practice Providers (APPs -  Physician Assistants and Nurse Practitioners) who all work together to provide you with the care you need, when you need it.  Your next appointment:   1 year(s)  The format for your next appointment:   In Person  Provider:   Henry W Smith III, MD    Important Information About Sugar       

## 2021-11-14 ENCOUNTER — Telehealth: Payer: Self-pay | Admitting: Allergy and Immunology

## 2021-11-14 NOTE — Telephone Encounter (Signed)
Patient called and stated that he came in to see Dr Ernst Bowler on September 28 for a bad cough. Patient states the doctor he usually sees is Dr Neldon Mc. Patient states that he is still coughing and he has finished his prednisone. Patient would like a call back as soon as possible. Patients call back number is [phone].

## 2021-11-14 NOTE — Addendum Note (Signed)
Addended by: Janan Halter F on: 11/14/2021 08:57 AM   Modules accepted: Orders

## 2021-11-14 NOTE — Addendum Note (Signed)
Addended byDanielle Dess on: 11/14/2021 09:41 AM   Modules accepted: Orders

## 2021-11-15 ENCOUNTER — Other Ambulatory Visit: Payer: Self-pay

## 2021-11-15 ENCOUNTER — Encounter: Payer: Self-pay | Admitting: Allergy and Immunology

## 2021-11-15 ENCOUNTER — Ambulatory Visit (INDEPENDENT_AMBULATORY_CARE_PROVIDER_SITE_OTHER): Payer: Medicare Other | Admitting: Allergy and Immunology

## 2021-11-15 VITALS — BP 122/70 | HR 70 | Temp 97.2°F | Resp 16

## 2021-11-15 DIAGNOSIS — J4550 Severe persistent asthma, uncomplicated: Secondary | ICD-10-CM

## 2021-11-15 DIAGNOSIS — K219 Gastro-esophageal reflux disease without esophagitis: Secondary | ICD-10-CM | POA: Diagnosis not present

## 2021-11-15 DIAGNOSIS — J3089 Other allergic rhinitis: Secondary | ICD-10-CM

## 2021-11-15 MED ORDER — METHYLPREDNISOLONE ACETATE 80 MG/ML IJ SUSP
80.0000 mg | Freq: Once | INTRAMUSCULAR | Status: AC
  Administered 2021-11-15: 80 mg via INTRAMUSCULAR

## 2021-11-15 MED ORDER — FAMOTIDINE 40 MG PO TABS: 40.0000 mg | ORAL_TABLET | Freq: Every day | ORAL | 5 refills | Status: DC

## 2021-11-15 NOTE — Patient Instructions (Addendum)
  1.  Treat and prevent inflammation of airway:   A. Breztri - 2 inhalations 2 times per day w/ spacer (empty lungs) B. Flonase - 1 spray each nostril 1-2  times per day C. Montelukast 10 mg - 1 tablet 1 time per day D. Depo-Medrol 80 IM delivered in clinic today E. Start tezepelumab injections today and every 4 weeks.  2.  Treat and prevent reflux:  A.  Increase esomeprazole 40 mg to twice a day B.  Start famotidine 40 mg in evening C.  Eliminate all sources of caffeine and chocolate consumption  3. Return to clinic in 4 weeks or earlier if problem  4. Obtain fall flu vaccine and RSV vaccine

## 2021-11-15 NOTE — Progress Notes (Signed)
[place] - [place] - [place] - [place]   Follow-up Note  Referring Provider: Creola Corn, MD Primary Provider: Creola Corn, MD Date of Office Visit: 11/15/2021  Subjective:   Isaiah Murphy (DOB: [birth year]) is a 87 y.o. 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  I last saw him in this clinic on 30 August 2021.  He did visit with Dr. Dellis Anes on 03 November 2021.  During his last visit with Dr. Dellis Anes he had unrelenting cough and some wheezing and he was started on prednisone and he improved significantly but unfortunately after discontinuing his prednisone his cough returned.  He is having disturbance of sleep secondary to his cough.  He does believe that his cough responds to a short acting bronchodilator.  He continues to use anti-inflammatory agents for his airway as previously prescribed.  He has not had any associated chest pain or sputum production or fever associated with this event.  He does not have a significant amount of upper airway symptoms at this point.  He is having significant problems with reflux with regurgitation and burning in his chest even know that he is using a proton pump inhibitor.  He minimizes his caffeine consumption for if he drinks caffeine he definitely develops bad problems with reflux.  Allergies as of 11/15/2021       Reactions   Penicillins Rash   Has patient had a PCN reaction causing immediate rash, facial/tongue/throat swelling, SOB or lightheadedness with hypotension: Yes Has patient had a PCN reaction causing severe rash involving mucus membranes or skin necrosis: Yes Has patient had a PCN reaction that required hospitalization: No Has patient had a PCN reaction occurring within the last 10 years: No If all of the above answers are "NO", then may proceed with  Cephalosporin use. Rash at the injection site        Medication List    albuterol 108 (90 Base) MCG/ACT inhaler Commonly known as: VENTOLIN HFA Inhale 2 puffs into the lungs every 4 (four) hours as needed for wheezing or shortness of breath.   Alvesco 160 MCG/ACT inhaler Generic drug: ciclesonide USE 1 INHALATION ORALLY TWICE DAILY TO PREVENT COUGH OR WHEEZE. INCREASE TO USE 2 INHALATIONS ORALLY TWICE DAILY WITH ASTHMA FLARE. RINSE, GARGLE AND SPIT AFTER USE.   apixaban 5 MG Tabs tablet Commonly known as: Eliquis Take 1 tablet (5 mg total) by mouth 2 (two) times daily. Needs Cardiology Appointment. Please call office to schedule   atorvastatin 20 MG tablet Commonly known as: LIPITOR Take 20 mg by mouth at bedtime.   Breztri Aerosphere 160-9-4.8 MCG/ACT Aero Generic drug: Budeson-Glycopyrrol-Formoterol Inhale 2 puffs into the lungs in the morning and at bedtime.   budesonide-formoterol 160-4.5 MCG/ACT inhaler Commonly known as: Symbicort Inhale 2 puffs into the lungs 2 (two) times daily.   dextromethorphan-guaiFENesin 30-600 MG 12hr tablet Commonly known as: MUCINEX DM Take 1 tablet by mouth 2 (two) times daily.   esomeprazole 40 MG capsule Commonly known as: NEXIUM Take 40 mg by mouth daily as needed (reflux).   fluticasone 50 MCG/ACT nasal spray Commonly known as: FLONASE Place 1 spray into both nostrils 2 (two) times daily as needed for allergies or rhinitis.   ketorolac 0.5 % ophthalmic solution Commonly known as: ACULAR 1 drop. Affected eye as needed   levocetirizine 5 MG  tablet Commonly known as: XYZAL Take 1 tablet (5 mg total) by mouth daily as needed (Can take an extra dose during flare ups.).   Mega Multi Men Tabs Take 1 tablet by mouth as needed.   montelukast 10 MG tablet Commonly known as: SINGULAIR Take 1 tablet (10 mg total) by mouth at bedtime.   olopatadine 0.1 % ophthalmic solution Commonly known as: PATANOL Place 1 drop into both eyes  daily.   Spacer/Aero-Holding [employer] 1 Device by Does not apply route as directed.   triamcinolone cream 0.1 % Commonly known as: KENALOG Apply 1 application topically 2 (two) times daily as needed (itching).      Past Medical History:  Diagnosis Date   Acute blood loss anemia    Allergic rhinitis    Arthritis    Asthma    Chronic anticoagulation    Constipation    Dyslipidemia    Dysrhythmia    history of paroxysmal A Fib and typical A Flutter    GERD (gastroesophageal reflux disease)    History of bronchitis    History of sick sinus syndrome    History of syncope    HLD (hyperlipidemia)    Hx of syncope    Hypertension    Hyponatremia    Leukocytosis    Moderate persistent asthma with acute exacerbation in adult    Osteoarthritis    s/p R & L THR   Presence of permanent cardiac pacemaker    Primary osteoarthritis of right hip    Sleep apnea    pt states not currently using CPAP machine    Unsteady gait     Past Surgical History:  Procedure Laterality Date   CARDIOVERSION N/A 04/28/2013   Procedure: CARDIOVERSION;  Surgeon: Lars Masson, MD;  Location: [facility] ENDOSCOPY;  Service: Cardiovascular;  Laterality: N/A;   HEMORRHOID SURGERY     left shoulder surgery      secondary to torn ligament / subluxation   TEE WITHOUT CARDIOVERSION N/A 04/28/2013   Procedure: TRANSESOPHAGEAL ECHOCARDIOGRAM (TEE);  Surgeon: Lars Masson, MD;  Location: [facility] ENDOSCOPY;  Service: Cardiovascular;  Laterality: N/A;   TOTAL HIP ARTHROPLASTY Right 03/08/2015   Procedure: RIGHT TOTAL HIP ARTHROPLASTY ANTERIOR APPROACH;  Surgeon: Ollen Gross, MD;  Location: [facility] ORS;  Service: Orthopedics;  Laterality: Right;   TOTAL HIP ARTHROPLASTY Left 01/07/2020   Procedure: TOTAL HIP ARTHROPLASTY ANTERIOR APPROACH;  Surgeon: Ollen Gross, MD;  Location: [facility] ORS;  Service: Orthopedics;  Laterality: Left;     Review of systems negative except as noted in HPI / PMHx or noted  below:  Review of Systems  Constitutional: Negative.   HENT: Negative.    Eyes: Negative.   Respiratory: Negative.    Cardiovascular: Negative.   Gastrointestinal: Negative.   Genitourinary: Negative.   Musculoskeletal: Negative.   Skin: Negative.   Neurological: Negative.   Endo/Heme/Allergies: Negative.   Psychiatric/Behavioral: Negative.       Objective:   Vitals:   11/15/21 0825  BP: 122/70  Pulse: 70  Resp: 16  Temp: (!) 97.2 F (36.2 C)  SpO2: 95%          Physical Exam Constitutional:      Appearance: He is not diaphoretic.  HENT:     Head: Normocephalic.     Right Ear: Tympanic membrane, ear canal and external ear normal.     Left Ear: Tympanic membrane, ear canal and external ear normal.     Nose: Nose normal. No mucosal edema  or rhinorrhea.     Mouth/Throat:     Pharynx: Uvula midline. No oropharyngeal exudate.  Eyes:     Conjunctiva/sclera: Conjunctivae normal.  Neck:     Thyroid: No thyromegaly.     Trachea: Trachea normal. No tracheal tenderness or tracheal deviation.  Cardiovascular:     Rate and Rhythm: Normal rate and regular rhythm.     Heart sounds: Normal heart sounds, S1 normal and S2 normal. No murmur heard. Pulmonary:     Effort: No respiratory distress.     Breath sounds: Normal breath sounds. No stridor. No wheezing or rales.  Lymphadenopathy:     Head:     Right side of head: No tonsillar adenopathy.     Left side of head: No tonsillar adenopathy.     Cervical: No cervical adenopathy.  Skin:    Findings: No erythema or rash.     Nails: There is no clubbing.  Neurological:     Mental Status: He is alert.     Diagnostics:    Spirometry was performed and demonstrated an FEV1 of 1.57 at 62 % of predicted.   Results of blood tests obtained 03 November 2021 identifies WBC 8.1.  Absolute eosinophil 100, absolute lymphocyte 1300, hemoglobin 16.4, IgE 126 KU/L  Assessment and Plan:   1. Not well controlled severe persistent  asthma   2. Perennial allergic rhinitis   3. Gastroesophageal reflux disease, unspecified whether esophagitis present     1.  Treat and prevent inflammation of airway:   A. Breztri - 2 inhalations 2 times per day w/ spacer (empty lungs) B. Flonase - 1 spray each nostril 1-2  times per day C. Montelukast 10 mg - 1 tablet 1 time per day D. Depo-Medrol 80 IM delivered in clinic today E. Start tezepelumab injections today and every 4 weeks.  2.  Treat and prevent reflux:  A.  Increase esomeprazole 40 mg to twice a day B.  Start famotidine 40 mg in evening C.  Eliminate all sources of caffeine and chocolate consumption  3. Return to clinic in 4 weeks or earlier if problem  4. 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  I will regroup with him in 4 weeks to assess his response to this plan.  Laurette Schimke, MD Allergy / Immunology [facility]

## 2021-11-16 ENCOUNTER — Encounter: Payer: Self-pay | Admitting: Allergy and Immunology

## 2021-11-16 ENCOUNTER — Telehealth: Payer: Self-pay | Admitting: *Deleted

## 2021-11-16 DIAGNOSIS — J4550 Severe persistent asthma, uncomplicated: Secondary | ICD-10-CM | POA: Diagnosis not present

## 2021-11-16 NOTE — Telephone Encounter (Signed)
-----   Message from Valentina Shaggy, MD sent at 11/11/2021  4:58 PM EDT ----- We can submit for Xolair.

## 2021-11-16 NOTE — Telephone Encounter (Signed)
I just assume [name] is hard to get with Medicare. Sounds good to me!   Salvatore Marvel, MD [facility] [place] [facility] [place]

## 2021-11-16 NOTE — Telephone Encounter (Signed)
Spoke to patient to discuss biologic start for Isaiah Murphy buy and bill for his asthma that he will start 11/12. Dr Darnell Level wanted Xolair but patient seen Dr Mylo Red yesterday so we will try the Tezspire for now

## 2021-11-16 NOTE — Progress Notes (Signed)
Per Dr Mylo Red saw patient yesterday will go ahead with Tezspire buy and bill

## 2021-11-17 ENCOUNTER — Ambulatory Visit (INDEPENDENT_AMBULATORY_CARE_PROVIDER_SITE_OTHER): Payer: Medicare Other

## 2021-11-17 DIAGNOSIS — J4550 Severe persistent asthma, uncomplicated: Secondary | ICD-10-CM | POA: Diagnosis not present

## 2021-11-17 MED ORDER — TEZEPELUMAB-EKKO 210 MG/1.91ML ~~LOC~~ SOSY
210.0000 mg | PREFILLED_SYRINGE | Freq: Once | SUBCUTANEOUS | Status: AC
  Administered 2021-11-17: 210 mg via SUBCUTANEOUS

## 2021-11-17 NOTE — Progress Notes (Signed)
Immunotherapy   Patient Details  Name: Isaiah Murphy MRN: [id number] Date of Birth: [birth year]  11/17/2021  [name] started injections for  Tezspire Following schedule: Every twenty eight days  Frequency: Every four weeks. Epi-Pen: Not needed. Consent signed in office and patient instructions given. Patient came and sat in the lobby for thirty minutes without an issue.    [name] 11/17/2021, 12:17 PM

## 2021-11-21 ENCOUNTER — Ambulatory Visit: Payer: Federal, State, Local not specified - PPO | Admitting: Allergy and Immunology

## 2021-11-22 NOTE — Progress Notes (Signed)
Remote pacemaker transmission.   

## 2021-12-01 IMAGING — RF DG HIP (WITH PELVIS) OPERATIVE*L*
1 series · 6 of 6 positions shown · non-contrast
Comparison: March 09, 2015

FLUOROSCOPY TIME:  0 minutes 5 seconds; 5 acquired images

CLINICAL DATA: Status post total hip replacement on the left

EXAM:
OPERATIVE LEFT HIP ) 1 VIEW
TECHNIQUE: Fluoroscopic spot image(s) were submitted for interpretation
post-operatively.

[Series 1: unknown protocol · 0.20mm/px · 6 of 6 slices shown]
[im 1/6]
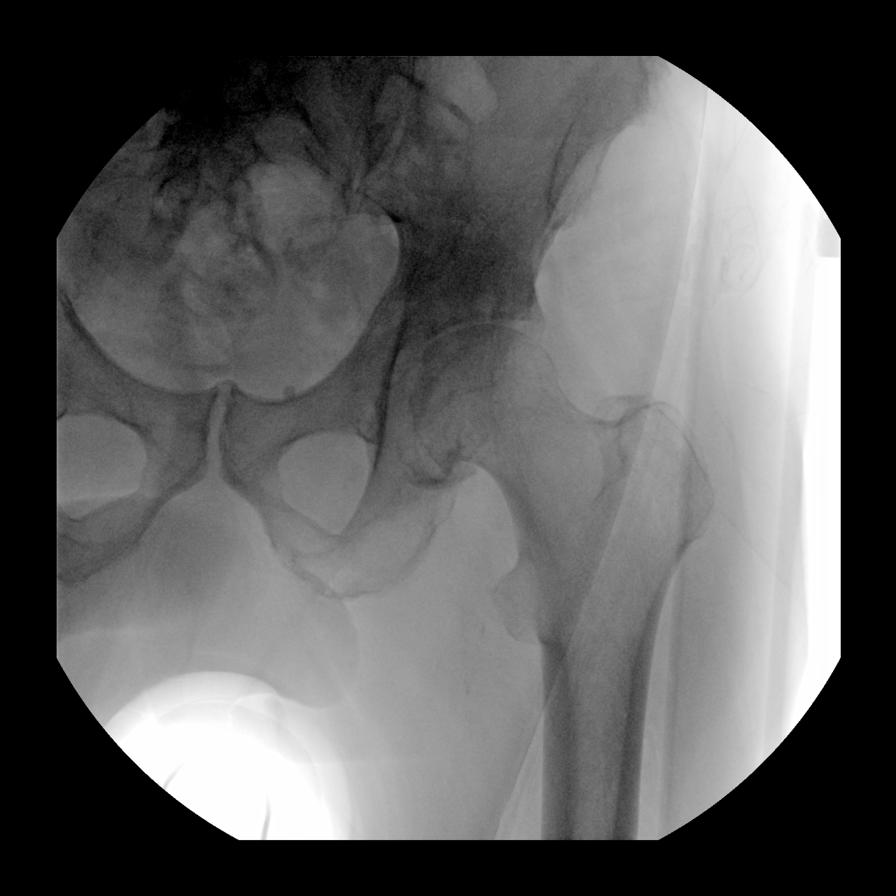
[im 2/6]
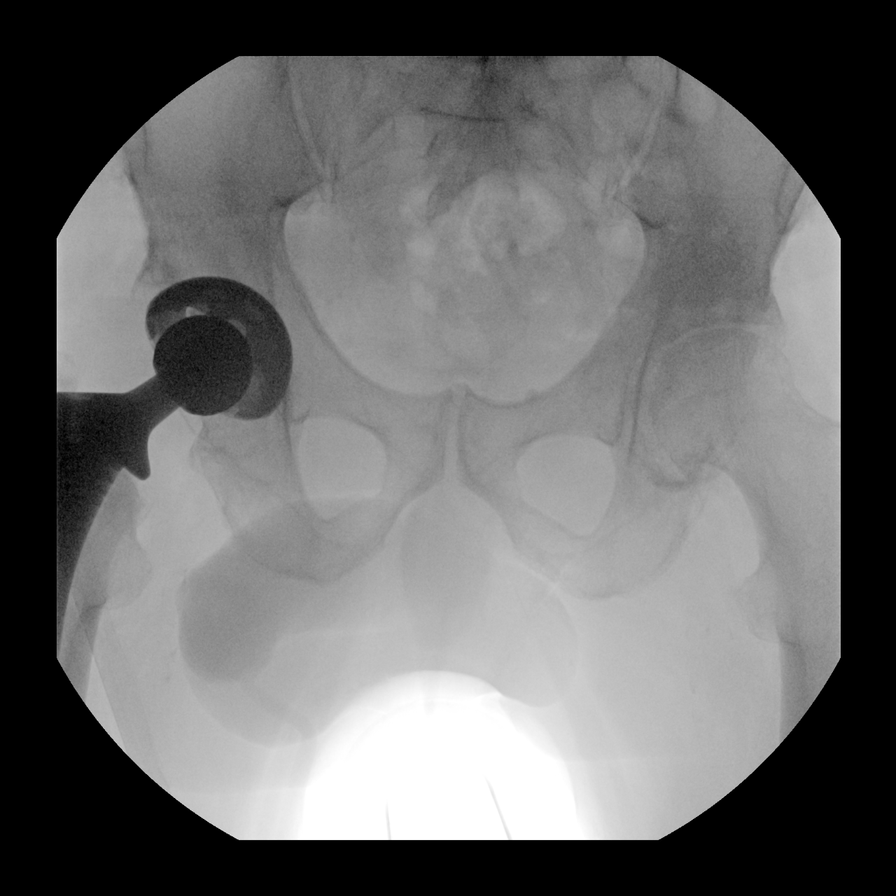
[im 3/6]
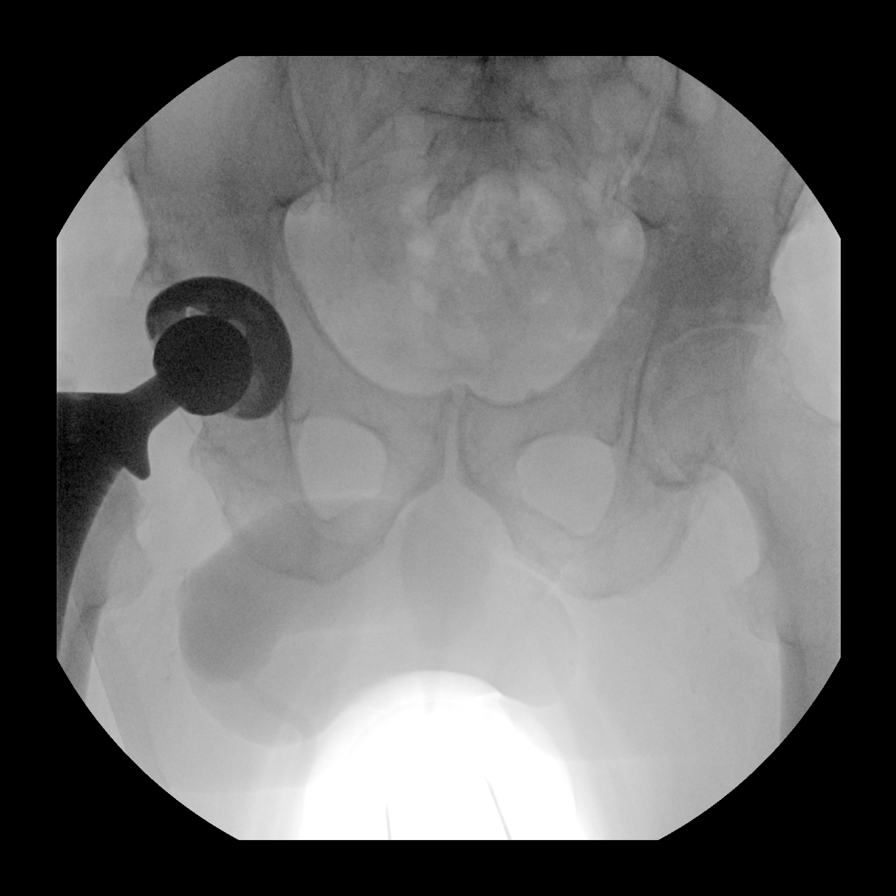
[im 4/6]
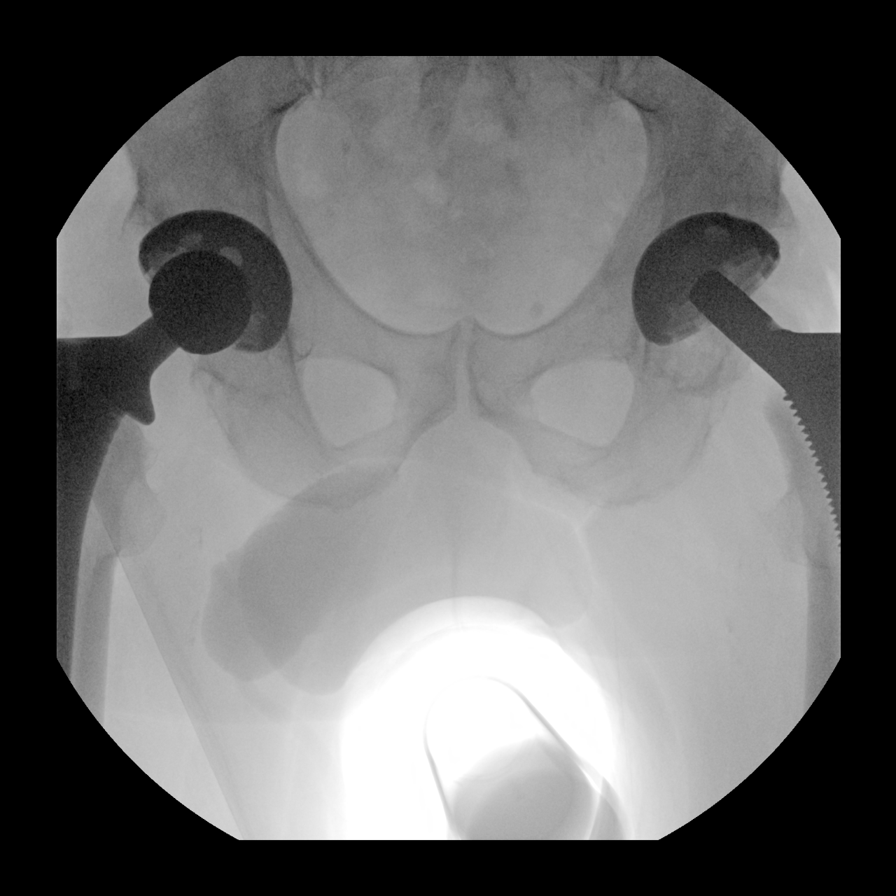
[im 5/6]
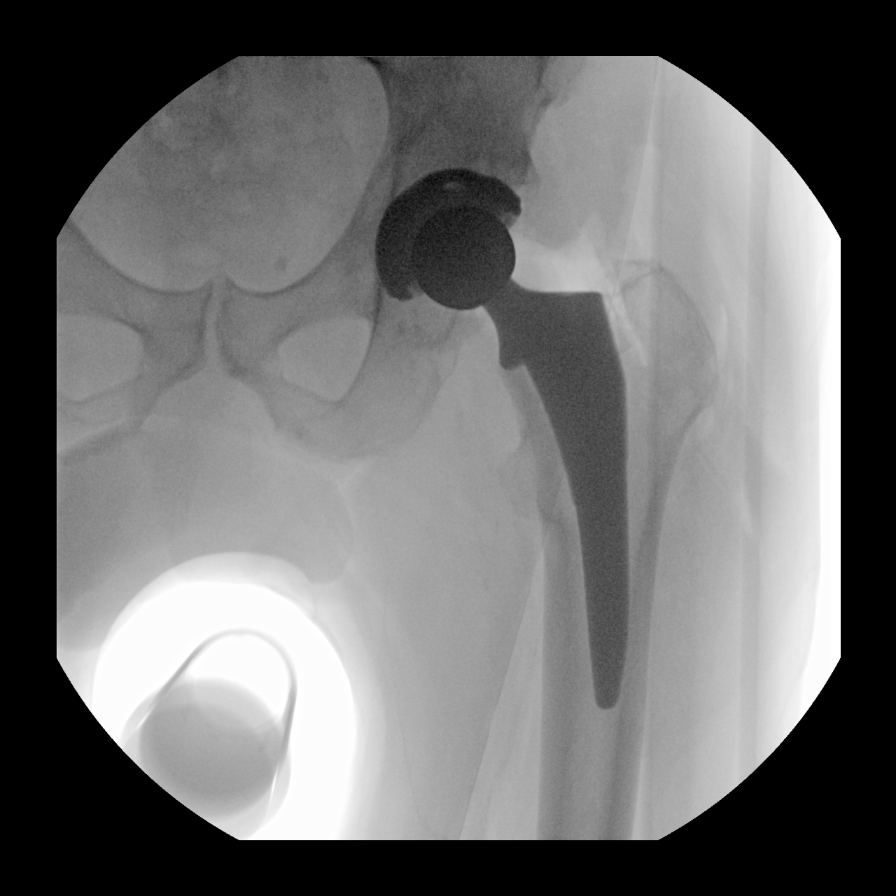
[im 6/6]
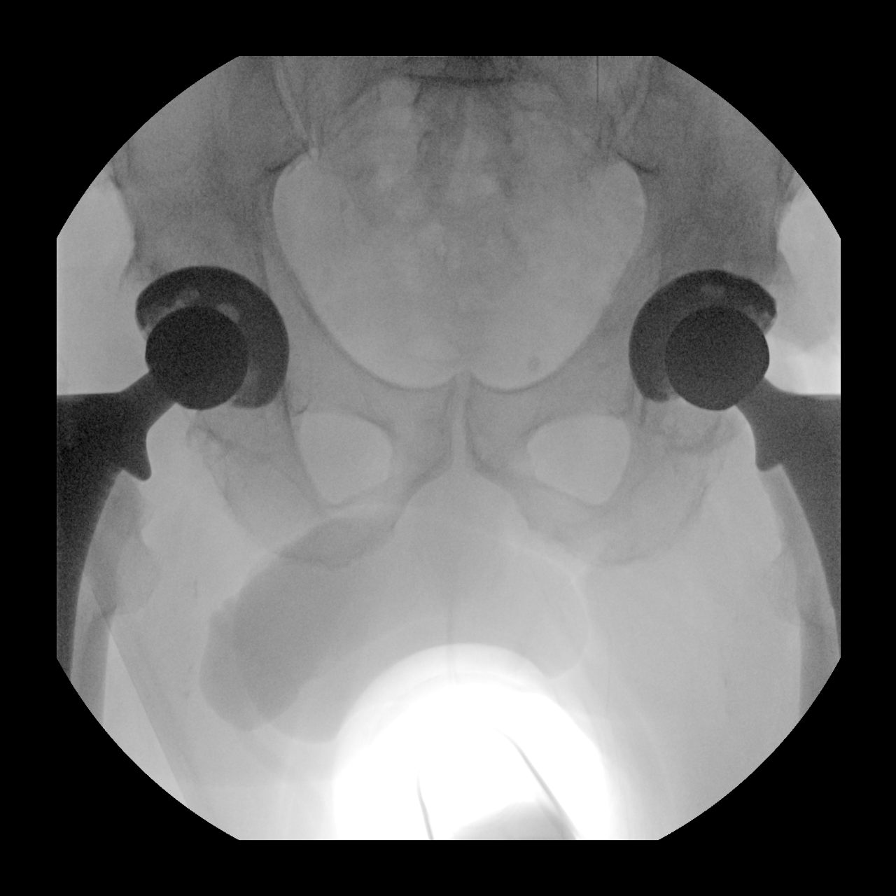

[6 of 6 positions shown; findings below may reference images not displayed]

FINDINGS: There is evidence of total hip replacement on the left with
prosthetic components well-seated on frontal view. Previous total
hip replacement on the right with prosthetic components on the right
also well-seated. No fracture or dislocation evident on frontal
views.
IMPRESSION: Status post total hip replacement on the left with prosthetic
components well-seated. Prior total hip replacement right with
prosthetic components well-seated. No acute fracture or dislocation.

## 2021-12-01 IMAGING — DX DG PORTABLE PELVIS
1 series · 1 of 1 positions shown · non-contrast
Comparison: Intraoperative images January 07, 2020;March 08, 2015

CLINICAL DATA: Status post left total hip replacement

EXAM:
PORTABLE PELVIS 1-2 VIEWS

[pelvis ap]
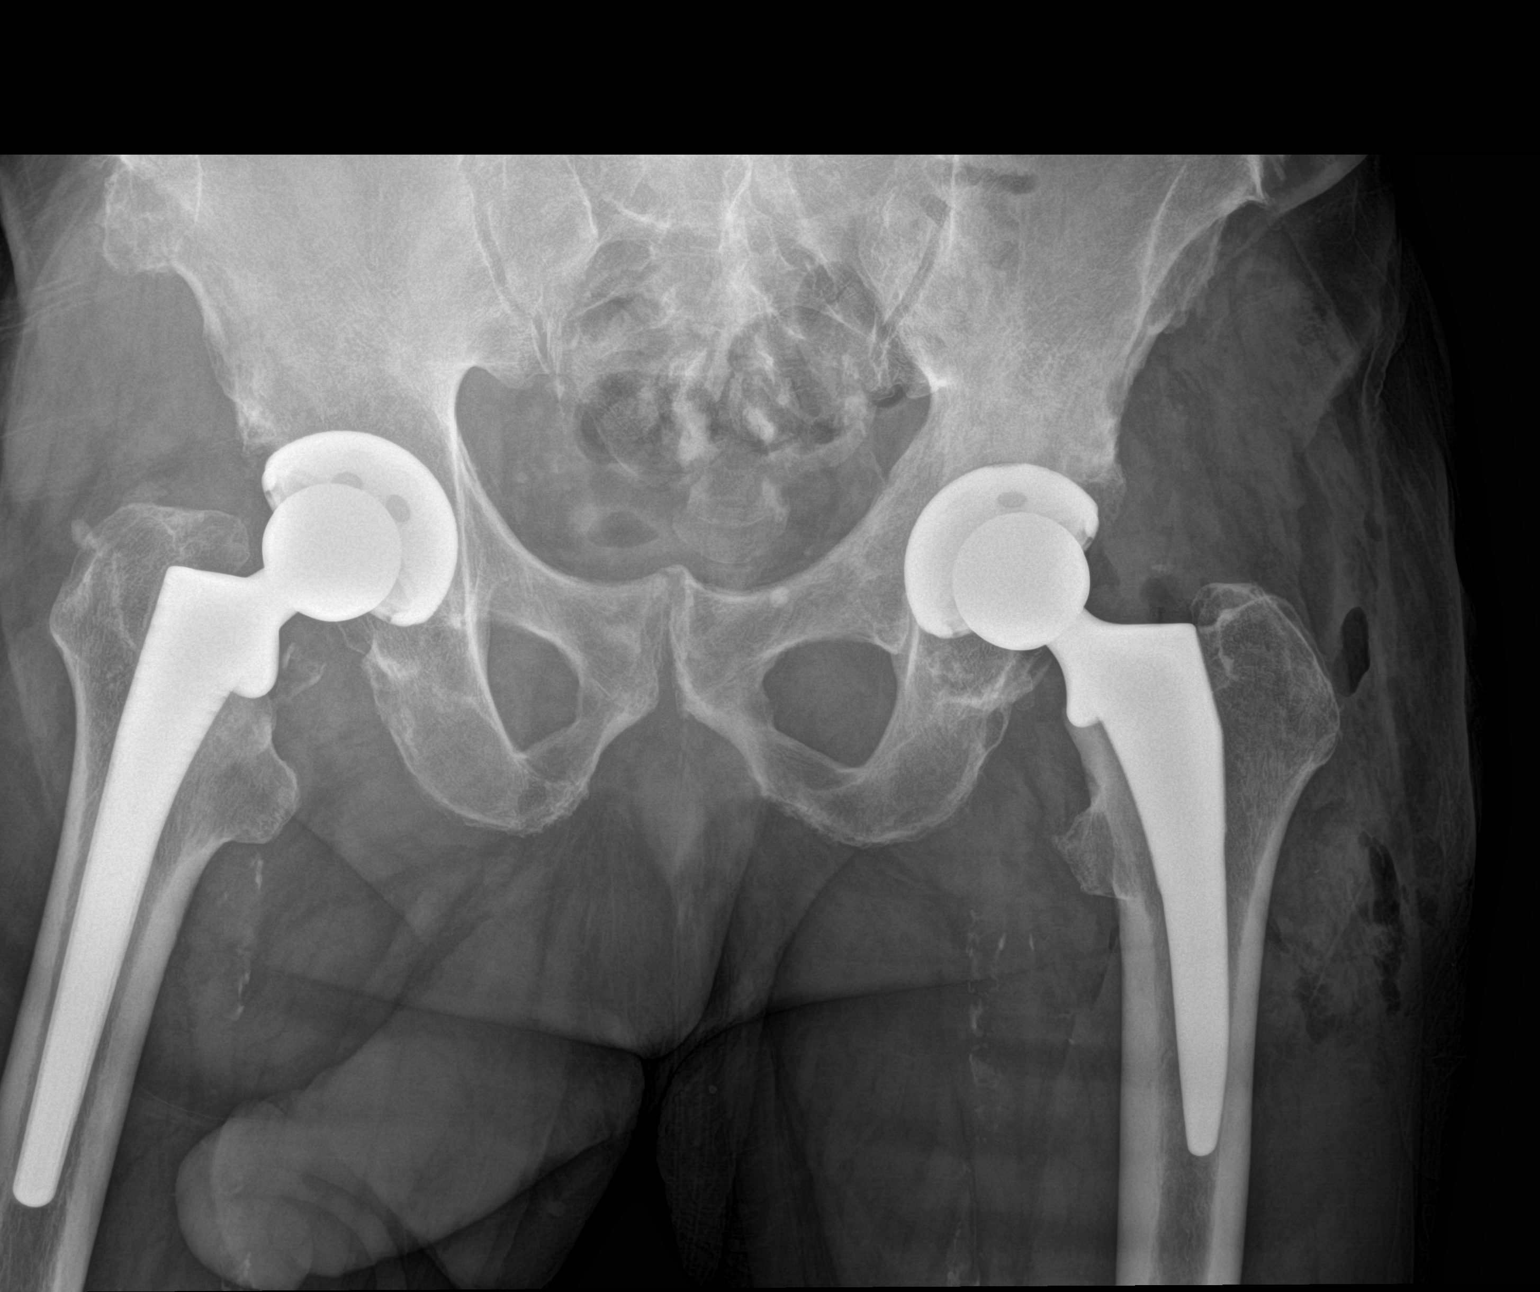

[1 of 1 positions shown; findings below may reference images not displayed]

FINDINGS: Frontal view obtained. There is a total hip replacement on each side
with prosthetic components bilaterally well-seated on frontal view.
No fracture or dislocation. Foci of superficial femoral artery
calcification noted bilaterally. Soft tissue air noted on the left
consistent with recent surgery.
IMPRESSION: Total hip replacement on each side with prosthetic components
bilaterally intact. No fracture or dislocation. Acute postoperative
change on the left.

## 2021-12-05 MED ORDER — TEZEPELUMAB-EKKO 210 MG/1.91ML ~~LOC~~ SOSY
210.0000 mg | PREFILLED_SYRINGE | Freq: Once | SUBCUTANEOUS | Status: AC
  Administered 2021-11-17: 210 mg via SUBCUTANEOUS

## 2021-12-05 NOTE — Addendum Note (Signed)
Addended by: Carin Hock on: 12/05/2021 11:01 AM   Modules accepted: Orders

## 2021-12-08 ENCOUNTER — Ambulatory Visit (INDEPENDENT_AMBULATORY_CARE_PROVIDER_SITE_OTHER): Payer: Medicare Other

## 2021-12-08 DIAGNOSIS — I495 Sick sinus syndrome: Secondary | ICD-10-CM

## 2021-12-09 LAB — CUP PACEART REMOTE DEVICE CHECK
Battery Remaining Longevity: 3 mo
Battery Voltage: 2.85 V
Brady Statistic RA Percent Paced: 0.04 %
Brady Statistic RV Percent Paced: 97.71 %
Date Time Interrogation Session: 20231102181036
Implantable Lead Connection Status: 753985
Implantable Lead Connection Status: 753985
Implantable Lead Implant Date: 20150413
Implantable Lead Implant Date: 20150413
Implantable Lead Location: 753859
Implantable Lead Location: 753860
Implantable Lead Model: 5076
Implantable Lead Model: 5076
Implantable Pulse Generator Implant Date: 20150413
Lead Channel Impedance Value: 342 Ohm
Lead Channel Impedance Value: 380 Ohm
Lead Channel Impedance Value: 399 Ohm
Lead Channel Impedance Value: 475 Ohm
Lead Channel Pacing Threshold Amplitude: 0.375 V
Lead Channel Pacing Threshold Amplitude: 0.875 V
Lead Channel Pacing Threshold Pulse Width: 0.4 ms
Lead Channel Pacing Threshold Pulse Width: 0.4 ms
Lead Channel Sensing Intrinsic Amplitude: 1.25 mV
Lead Channel Sensing Intrinsic Amplitude: 1.25 mV
Lead Channel Sensing Intrinsic Amplitude: 11.75 mV
Lead Channel Sensing Intrinsic Amplitude: 11.75 mV
Lead Channel Setting Pacing Amplitude: 1.5 V
Lead Channel Setting Pacing Amplitude: 2.5 V
Lead Channel Setting Pacing Pulse Width: 0.4 ms
Lead Channel Setting Sensing Sensitivity: 0.9 mV
Zone Setting Status: 755011
Zone Setting Status: 755011

## 2021-12-13 ENCOUNTER — Ambulatory Visit (INDEPENDENT_AMBULATORY_CARE_PROVIDER_SITE_OTHER): Payer: Medicare Other | Admitting: Allergy and Immunology

## 2021-12-13 ENCOUNTER — Encounter: Payer: Self-pay | Admitting: Allergy and Immunology

## 2021-12-13 VITALS — BP 132/86 | HR 82 | Temp 97.2°F | Resp 18 | Ht 69.5 in | Wt 179.4 lb

## 2021-12-13 DIAGNOSIS — J4550 Severe persistent asthma, uncomplicated: Secondary | ICD-10-CM

## 2021-12-13 DIAGNOSIS — J3089 Other allergic rhinitis: Secondary | ICD-10-CM | POA: Diagnosis not present

## 2021-12-13 DIAGNOSIS — K219 Gastro-esophageal reflux disease without esophagitis: Secondary | ICD-10-CM

## 2021-12-13 NOTE — Progress Notes (Unsigned)
[place] [facility] [place] [facility] [place] [facility] [place] [facility] [place]   Follow-up Note  Referring Provider: Creola Corn, MD Primary Provider: Creola Corn, MD Date of Office Visit: 12/13/2021  Subjective:   Isaiah Murphy (DOB: 12-Dec-1934) is a 87 y.o. 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  I last saw him in this clinic on 15 November 2021.  During his last visit we had to give him a systemic steroid and we started him on tezepelumab injections.  Currently he is doing very well with his asthma and does not need to use any short acting bronchodilator and can exert himself to the extent that he so desires.  He does not have any cough or he does not have any wheezing and he does not have any shortness of breath.  He continues to use his triple inhaler on a consistent basis and continues to receive tezepelumab injections.  He has had very little problems with his upper airways while using a nasal steroid and montelukast.  His reflux is much better while using his proton pump inhibitor twice a day.  He now finds that he does not need to use any famotidine at nighttime.  He has had the flu vaccine.  Allergies as of 12/13/2021       Reactions   Penicillins Rash   Has patient had a PCN reaction causing immediate rash, facial/tongue/throat swelling, SOB or lightheadedness with hypotension: Yes Has patient had a PCN reaction causing severe rash involving mucus membranes or skin necrosis: Yes Has patient had a PCN reaction that required hospitalization: No Has patient had a PCN reaction occurring within the last 10 years: No If all of the above answers are "NO", then may proceed with Cephalosporin use. Rash at the injection site        Medication List    albuterol 108 (90 Base) MCG/ACT inhaler Commonly known as: VENTOLIN HFA Inhale 2 puffs into  the lungs every 4 (four) hours as needed for wheezing or shortness of breath.   Alvesco 160 MCG/ACT inhaler Generic drug: ciclesonide USE 1 INHALATION ORALLY TWICE DAILY TO PREVENT COUGH OR WHEEZE. INCREASE TO USE 2 INHALATIONS ORALLY TWICE DAILY WITH ASTHMA FLARE. RINSE, GARGLE AND SPIT AFTER USE.   apixaban 5 MG Tabs tablet Commonly known as: Eliquis Take 1 tablet (5 mg total) by mouth 2 (two) times daily. Needs Cardiology Appointment. Please call office to schedule   atorvastatin 20 MG tablet Commonly known as: LIPITOR Take 20 mg by mouth at bedtime.   budesonide-formoterol 160-4.5 MCG/ACT inhaler Commonly known as: Symbicort Inhale 2 puffs into the lungs 2 (two) times daily.   dextromethorphan-guaiFENesin 30-600 MG 12hr tablet Commonly known as: MUCINEX DM Take 1 tablet by mouth 2 (two) times daily.   esomeprazole 40 MG capsule Commonly known as: NEXIUM Take 40 mg by mouth daily as needed (reflux).   famotidine 40 MG tablet Commonly known as: PEPCID Take 1 tablet (40 mg total) by mouth daily.   fluticasone 50 MCG/ACT nasal spray Commonly known as: FLONASE Place 1 spray into both nostrils 2 (two) times daily as needed for allergies or rhinitis.   ketorolac 0.5 % ophthalmic solution Commonly known as: ACULAR 1 drop. Affected eye as needed   levocetirizine 5 MG tablet Commonly known as: XYZAL Take 1 tablet (5 mg total) by mouth daily as needed (Can take  an extra dose during flare ups.).   Mega Multi Men Tabs Take 1 tablet by mouth as needed.   montelukast 10 MG tablet Commonly known as: SINGULAIR Take 1 tablet (10 mg total) by mouth at bedtime.   olopatadine 0.1 % ophthalmic solution Commonly known as: PATANOL Place 1 drop into both eyes daily.   Spacer/Aero-Holding [employer] 1 Device by Does not apply route as directed.   triamcinolone cream 0.1 % Commonly known as: KENALOG Apply 1 application topically 2 (two) times daily as needed (itching).     Past Medical History:  Diagnosis Date   Acute blood loss anemia    Allergic rhinitis    Arthritis    Asthma    Chronic anticoagulation    Constipation    Dyslipidemia    Dysrhythmia    history of paroxysmal A Fib and typical A Flutter    GERD (gastroesophageal reflux disease)    History of bronchitis    History of sick sinus syndrome    History of syncope    HLD (hyperlipidemia)    Hx of syncope    Hypertension    Hyponatremia    Leukocytosis    Moderate persistent asthma with acute exacerbation in adult    Osteoarthritis    s/p R & L THR   Presence of permanent cardiac pacemaker    Primary osteoarthritis of right hip    Sleep apnea    pt states not currently using CPAP machine    Unsteady gait     Past Surgical History:  Procedure Laterality Date   CARDIOVERSION N/A 04/28/2013   Procedure: CARDIOVERSION;  Surgeon: Lars Masson, MD;  Location: [facility] ENDOSCOPY;  Service: Cardiovascular;  Laterality: N/A;   HEMORRHOID SURGERY     left shoulder surgery      secondary to torn ligament / subluxation   TEE WITHOUT CARDIOVERSION N/A 04/28/2013   Procedure: TRANSESOPHAGEAL ECHOCARDIOGRAM (TEE);  Surgeon: Lars Masson, MD;  Location: [facility] ENDOSCOPY;  Service: Cardiovascular;  Laterality: N/A;   TOTAL HIP ARTHROPLASTY Right 03/08/2015   Procedure: RIGHT TOTAL HIP ARTHROPLASTY ANTERIOR APPROACH;  Surgeon: Ollen Gross, MD;  Location: [facility] ORS;  Service: Orthopedics;  Laterality: Right;   TOTAL HIP ARTHROPLASTY Left 01/07/2020   Procedure: TOTAL HIP ARTHROPLASTY ANTERIOR APPROACH;  Surgeon: Ollen Gross, MD;  Location: [facility] ORS;  Service: Orthopedics;  Laterality: Left;     Review of systems negative except as noted in HPI / PMHx or noted below:  Review of Systems  Constitutional: Negative.   HENT: Negative.    Eyes: Negative.   Respiratory: Negative.    Cardiovascular: Negative.   Gastrointestinal: Negative.   Genitourinary: Negative.   Musculoskeletal:  Negative.   Skin: Negative.   Neurological: Negative.   Endo/Heme/Allergies: Negative.   Psychiatric/Behavioral: Negative.       Objective:   Vitals:   12/13/21 0958  BP: 132/86  Pulse: 82  Resp: 18  Temp: (!) 97.2 F (36.2 C)  SpO2: 98%   Height: 5' 9.5" (176.5 cm)  Weight: 179 lb 6.4 oz (81.4 kg)   Physical Exam Constitutional:      Appearance: He is not diaphoretic.  HENT:     Head: Normocephalic.     Right Ear: Tympanic membrane, ear canal and external ear normal.     Left Ear: Tympanic membrane, ear canal and external ear normal.     Murphy: Murphy normal. No mucosal edema or rhinorrhea.     Mouth/Throat:     Pharynx:  Uvula midline. No oropharyngeal exudate.  Eyes:     Conjunctiva/sclera: Conjunctivae normal.  Neck:     Thyroid: No thyromegaly.     Trachea: Trachea normal. No tracheal tenderness or tracheal deviation.  Cardiovascular:     Rate and Rhythm: Normal rate and regular rhythm.     Heart sounds: Normal heart sounds, S1 normal and S2 normal. No murmur heard. Pulmonary:     Effort: No respiratory distress.     Breath sounds: Normal breath sounds. No stridor. No wheezing or rales.  Lymphadenopathy:     Head:     Right side of head: No tonsillar adenopathy.     Left side of head: No tonsillar adenopathy.     Cervical: No cervical adenopathy.  Skin:    Findings: No erythema or rash.     Nails: There is no clubbing.  Neurological:     Mental Status: He is alert.     Diagnostics:    Spirometry was performed and demonstrated an FEV1 of 1.93 at 56 % of predicted.  Assessment and Plan:   1. Asthma, severe persistent, well-controlled   2. Perennial allergic rhinitis   3. Gastroesophageal reflux disease, unspecified whether esophagitis present    1.  Treat and prevent inflammation of airway:   A. Breztri - 2 inhalations 2 times per day w/ spacer (empty lungs) B. Flonase - 1 spray each nostril 1-2  times per day C. Montelukast 10 mg - 1 tablet 1 time  per day D. Tezepelumab injections every 4 weeks.  2.  Treat and prevent reflux:  A.  Esomeprazole 40 mg to twice a day B.  Famotidine 40 mg in evening if needed  3. Return to clinic in 12 weeks or earlier if problem  4. Obtain RSV vaccine  Dr. Ishmael Holter will continue to use anti-inflammatory agents for his airway including the use of an anti-TSL P antibody and also continue to address the issue with reflux as noted above.  Assuming he does well with this plan I will see him back in this clinic in 12 weeks or earlier if there is a problem.  Allena Katz, MD Allergy / Immunology [place]

## 2021-12-13 NOTE — Patient Instructions (Signed)
  1.  Treat and prevent inflammation of airway:   A. Breztri - 2 inhalations 2 times per day w/ spacer (empty lungs) B. Flonase - 1 spray each nostril 1-2  times per day C. Montelukast 10 mg - 1 tablet 1 time per day D. Tezepelumab injections every 4 weeks.  2.  Treat and prevent reflux:  A.  Esomeprazole 40 mg to twice a day B.  Famotidine 40 mg in evening if needed  3. Return to clinic in 12 weeks or earlier if problem  4. Obtain RSV vaccine

## 2021-12-14 ENCOUNTER — Encounter: Payer: Self-pay | Admitting: Allergy and Immunology

## 2021-12-15 ENCOUNTER — Ambulatory Visit (INDEPENDENT_AMBULATORY_CARE_PROVIDER_SITE_OTHER): Payer: Medicare Other

## 2021-12-15 DIAGNOSIS — J4550 Severe persistent asthma, uncomplicated: Secondary | ICD-10-CM

## 2021-12-15 MED ORDER — TEZEPELUMAB-EKKO 210 MG/1.91ML ~~LOC~~ SOSY
210.0000 mg | PREFILLED_SYRINGE | Freq: Once | SUBCUTANEOUS | Status: AC
  Administered 2021-12-15: 210 mg via SUBCUTANEOUS

## 2021-12-19 NOTE — Progress Notes (Signed)
Remote pacemaker transmission.   

## 2021-12-19 NOTE — Addendum Note (Signed)
Addended by: Elease Etienne A on: 12/19/2021 02:52 PM   Modules accepted: Level of Service

## 2021-12-20 ENCOUNTER — Ambulatory Visit: Payer: Federal, State, Local not specified - PPO | Admitting: Allergy and Immunology

## 2022-01-09 ENCOUNTER — Ambulatory Visit (INDEPENDENT_AMBULATORY_CARE_PROVIDER_SITE_OTHER): Payer: Medicare Other

## 2022-01-09 DIAGNOSIS — I495 Sick sinus syndrome: Secondary | ICD-10-CM

## 2022-01-11 LAB — CUP PACEART REMOTE DEVICE CHECK
Battery Remaining Longevity: 2 mo
Battery Voltage: 2.85 V
Brady Statistic RA Percent Paced: 0.05 %
Brady Statistic RV Percent Paced: 97.71 %
Date Time Interrogation Session: 20231206141019
Implantable Lead Connection Status: 753985
Implantable Lead Connection Status: 753985
Implantable Lead Implant Date: 20150413
Implantable Lead Implant Date: 20150413
Implantable Lead Location: 753859
Implantable Lead Location: 753860
Implantable Lead Model: 5076
Implantable Lead Model: 5076
Implantable Pulse Generator Implant Date: 20150413
Lead Channel Impedance Value: 361 Ohm
Lead Channel Impedance Value: 418 Ohm
Lead Channel Impedance Value: 418 Ohm
Lead Channel Impedance Value: 494 Ohm
Lead Channel Pacing Threshold Amplitude: 0.375 V
Lead Channel Pacing Threshold Amplitude: 0.875 V
Lead Channel Pacing Threshold Pulse Width: 0.4 ms
Lead Channel Pacing Threshold Pulse Width: 0.4 ms
Lead Channel Sensing Intrinsic Amplitude: 0.875 mV
Lead Channel Sensing Intrinsic Amplitude: 0.875 mV
Lead Channel Sensing Intrinsic Amplitude: 14.5 mV
Lead Channel Sensing Intrinsic Amplitude: 14.5 mV
Lead Channel Setting Pacing Amplitude: 1.5 V
Lead Channel Setting Pacing Amplitude: 2.5 V
Lead Channel Setting Pacing Pulse Width: 0.4 ms
Lead Channel Setting Sensing Sensitivity: 0.9 mV
Zone Setting Status: 755011
Zone Setting Status: 755011

## 2022-01-12 ENCOUNTER — Ambulatory Visit (INDEPENDENT_AMBULATORY_CARE_PROVIDER_SITE_OTHER): Payer: Medicare Other

## 2022-01-12 DIAGNOSIS — J4550 Severe persistent asthma, uncomplicated: Secondary | ICD-10-CM

## 2022-01-12 MED ORDER — TEZEPELUMAB-EKKO 210 MG/1.91ML ~~LOC~~ SOSY
210.0000 mg | PREFILLED_SYRINGE | SUBCUTANEOUS | Status: AC
  Administered 2022-01-12 – 2024-02-21 (×26): 210 mg via SUBCUTANEOUS

## 2022-01-16 ENCOUNTER — Encounter: Payer: Self-pay | Admitting: Internal Medicine

## 2022-01-16 ENCOUNTER — Ambulatory Visit: Payer: Medicare Other | Attending: Internal Medicine | Admitting: Internal Medicine

## 2022-01-16 VITALS — BP 140/78 | HR 86 | Ht 69.5 in | Wt 180.4 lb

## 2022-01-16 DIAGNOSIS — I10 Essential (primary) hypertension: Secondary | ICD-10-CM

## 2022-01-16 DIAGNOSIS — Z950 Presence of cardiac pacemaker: Secondary | ICD-10-CM | POA: Diagnosis not present

## 2022-01-16 DIAGNOSIS — I495 Sick sinus syndrome: Secondary | ICD-10-CM | POA: Insufficient documentation

## 2022-01-16 NOTE — Patient Instructions (Addendum)
Medication Instructions:  Your physician recommends that you continue on your current medications as directed. Please refer to the Current Medication list given to you today.  *If you need a refill on your cardiac medications before your next appointment, please call your pharmacy*  Lab Work: None ordered.  If you have labs (blood work) drawn today and your tests are completely normal, you will receive your results only by: MyChart Message (if you have MyChart) OR A paper copy in the mail If you have any lab test that is abnormal or we need to change your treatment, we will call you to review the results.  Testing/Procedures: None ordered.  Follow-Up: We will notify you when your Medtronic Pacemaker generator change is due.  We will schedule this with you, please contact us if you have any questions or concerns.  Waiting on ERI to occur per Dr. Ladona Ridgel.

## 2022-01-16 NOTE — Progress Notes (Signed)
HPI [name] returns today for ongoing evaluation and management of his PPM. He is a pleasant 87 yo man with a h/o HTN, sinus node dysfunction and CHB, who is s/p PPM insertion. He has moved back to [place] from [place]. He has PAF. he does not have palpitations.  Allergies  Allergen Reactions   Penicillins Rash    Has patient had a PCN reaction causing immediate rash, facial/tongue/throat swelling, SOB or lightheadedness with hypotension: Yes Has patient had a PCN reaction causing severe rash involving mucus membranes or skin necrosis: Yes Has patient had a PCN reaction that required hospitalization: No Has patient had a PCN reaction occurring within the last 10 years: No If all of the above answers are "NO", then may proceed with Cephalosporin use. Rash at the injection site     Current Outpatient Medications  Medication Sig Dispense Refill   albuterol (VENTOLIN HFA) 108 (90 Base) MCG/ACT inhaler Inhale 2 puffs into the lungs every 4 (four) hours as needed for wheezing or shortness of breath. 18 g 1   apixaban (ELIQUIS) 5 MG TABS tablet Take 1 tablet (5 mg total) by mouth 2 (two) times daily. Needs Cardiology Appointment. Please call office to schedule 180 tablet 0   atorvastatin (LIPITOR) 20 MG tablet Take 20 mg by mouth at bedtime.      budesonide-formoterol (SYMBICORT) 160-4.5 MCG/ACT inhaler Inhale 2 puffs into the lungs 2 (two) times daily. 1 each 5   ciclesonide (ALVESCO) 160 MCG/ACT inhaler USE 1 INHALATION ORALLY TWICE DAILY TO PREVENT COUGH OR WHEEZE. INCREASE TO USE 2 INHALATIONS ORALLY TWICE DAILY WITH ASTHMA FLARE. RINSE, GARGLE AND SPIT AFTER USE. 18.3 g 4   dextromethorphan-guaiFENesin (MUCINEX DM) 30-600 MG 12hr tablet Take 1 tablet by mouth 2 (two) times daily. 60 tablet 3   esomeprazole (NEXIUM) 40 MG capsule Take 40 mg by mouth daily as needed (reflux).     famotidine (PEPCID) 40 MG tablet Take 1 tablet (40 mg total) by mouth daily. 30 tablet 5   fluticasone  (FLONASE) 50 MCG/ACT nasal spray Place 1 spray into both nostrils 2 (two) times daily as needed for allergies or rhinitis. 16 g 11   ketorolac (ACULAR) 0.5 % ophthalmic solution 1 drop. Affected eye as needed     levocetirizine (XYZAL) 5 MG tablet Take 1 tablet (5 mg total) by mouth daily as needed (Can take an extra dose during flare ups.). 60 tablet 5   montelukast (SINGULAIR) 10 MG tablet Take 1 tablet (10 mg total) by mouth at bedtime. 30 tablet 11   Multiple Vitamins-Minerals (MEGA MULTI MEN) TABS Take 1 tablet by mouth as needed.     olopatadine (PATANOL) 0.1 % ophthalmic solution Place 1 drop into both eyes daily.     Spacer/Aero-Holding [employer] 1 Device by Does not apply route as directed. 1 each 2   triamcinolone (KENALOG) 0.1 % Apply 1 application topically 2 (two) times daily as needed (itching). 80 g 3   Current Facility-Administered Medications  Medication Dose Route Frequency Provider Last Rate Last Admin   tezepelumab-ekko (TEZSPIRE) 210 MG/1. syringe 210 mg  210 mg Subcutaneous Q28 days Jessica Priest, MD   210 mg at 01/12/22 1043     Past Medical History:  Diagnosis Date   Acute blood loss anemia    Allergic rhinitis    Arthritis    Asthma    Chronic anticoagulation    Constipation    Dyslipidemia    Dysrhythmia  history of paroxysmal A Fib and typical A Flutter    GERD (gastroesophageal reflux disease)    History of bronchitis    History of sick sinus syndrome    History of syncope    HLD (hyperlipidemia)    Hx of syncope    Hypertension    Hyponatremia    Leukocytosis    Moderate persistent asthma with acute exacerbation in adult    Osteoarthritis    s/p R & L THR   Presence of permanent cardiac pacemaker    Primary osteoarthritis of right hip    Sleep apnea    pt states not currently using CPAP machine    Unsteady gait     ROS:   All systems reviewed and negative except as noted in the HPI.   Past Surgical History:  Procedure  Laterality Date   CARDIOVERSION N/A 04/28/2013   Procedure: CARDIOVERSION;  Surgeon: Lars Masson, MD;  Location: [facility] ENDOSCOPY;  Service: Cardiovascular;  Laterality: N/A;   HEMORRHOID SURGERY     left shoulder surgery      secondary to torn ligament / subluxation   TEE WITHOUT CARDIOVERSION N/A 04/28/2013   Procedure: TRANSESOPHAGEAL ECHOCARDIOGRAM (TEE);  Surgeon: Lars Masson, MD;  Location: [facility] ENDOSCOPY;  Service: Cardiovascular;  Laterality: N/A;   TOTAL HIP ARTHROPLASTY Right 03/08/2015   Procedure: RIGHT TOTAL HIP ARTHROPLASTY ANTERIOR APPROACH;  Surgeon: Ollen Gross, MD;  Location: [facility] ORS;  Service: Orthopedics;  Laterality: Right;   TOTAL HIP ARTHROPLASTY Left 01/07/2020   Procedure: TOTAL HIP ARTHROPLASTY ANTERIOR APPROACH;  Surgeon: Ollen Gross, MD;  Location: [facility] ORS;  Service: Orthopedics;  Laterality: Left;      Family History  Problem Relation Age of Onset   Hypertension Father    Allergic rhinitis Neg Hx    Asthma Neg Hx    Eczema Neg Hx    Immunodeficiency Neg Hx    Urticaria Neg Hx    Sleep apnea Neg Hx      Social History   Socioeconomic History   Marital status: Married    Spouse name: Not on file   Number of children: Not on file   Years of education: Not on file   Highest education level: Not on file  Occupational History   Not on file  Tobacco Use   Smoking status: Never   Smokeless tobacco: Never  Vaping Use   Vaping Use: Never used  Substance and Sexual Activity   Alcohol use: Yes    Alcohol/week: 0.0 standard drinks of alcohol    Comment: socially    Drug use: No   Sexual activity: Not on file  Other Topics Concern   Not on file  Social History Narrative   ** Merged History Encounter **       Married, Scientist, research (medical) in [place]   Social Determinants of Corporate investment banker Strain: Not on Ship broker Insecurity: Not on file  Transportation Needs: Not on file  Physical Activity: Not on file   Stress: Not on file  Social Connections: Not on file  Intimate Partner Violence: Not on file     BP (!) 140/78   Pulse 86   Ht 5' 9.5" (1.765 m)   Wt 180 lb 6.4 oz (81.8 kg)   SpO2 98%   BMI 26.26 kg/m   Physical Exam:  Well appearing NAD HEENT: Unremarkable Neck:  No JVD, no thyromegally Lymphatics:  No adenopathy Back:  No CVA tenderness Lungs:  Clear HEART:  Regular rate rhythm, no murmurs, no rubs, no clicks Abd:  soft, positive bowel sounds, no organomegally, no rebound, no guarding Ext:  2 plus pulses, no edema, no cyanosis, no clubbing Skin:  No rashes no nodules Neuro:  CN II through XII intact, motor grossly intact   DEVICE  Normal device function.  See PaceArt for details. 2 months to ERI  Assess/Plan:  Tachybrady syndrome - he is maintaining NSR. He is asymptomatic. 2. CHB - he is asymptomatic. He has no escape today. 3. PPM - his medtronic DDD PM is working normally. We will recheck in several months. He is about 2 months from ERI. 4. HTN - his sbp is up a bit. No change in his meds today. He will maintain a low sodium diet.   Sharlot Gowda Neave Lenger,MD

## 2022-02-07 ENCOUNTER — Ambulatory Visit (INDEPENDENT_AMBULATORY_CARE_PROVIDER_SITE_OTHER): Payer: Medicare Other

## 2022-02-07 DIAGNOSIS — I495 Sick sinus syndrome: Secondary | ICD-10-CM | POA: Diagnosis not present

## 2022-02-09 ENCOUNTER — Ambulatory Visit (INDEPENDENT_AMBULATORY_CARE_PROVIDER_SITE_OTHER): Payer: Medicare Other

## 2022-02-09 DIAGNOSIS — J4550 Severe persistent asthma, uncomplicated: Secondary | ICD-10-CM | POA: Diagnosis not present

## 2022-02-09 LAB — CUP PACEART REMOTE DEVICE CHECK
Battery Remaining Longevity: 2 mo
Battery Voltage: 2.84 V
Brady Statistic RA Percent Paced: 0 %
Brady Statistic RV Percent Paced: 99.56 %
Date Time Interrogation Session: 20240104151835
Implantable Lead Connection Status: 753985
Implantable Lead Connection Status: 753985
Implantable Lead Implant Date: 20150413
Implantable Lead Implant Date: 20150413
Implantable Lead Location: 753859
Implantable Lead Location: 753860
Implantable Lead Model: 5076
Implantable Lead Model: 5076
Implantable Pulse Generator Implant Date: 20150413
Lead Channel Impedance Value: 361 Ohm
Lead Channel Impedance Value: 399 Ohm
Lead Channel Impedance Value: 418 Ohm
Lead Channel Impedance Value: 475 Ohm
Lead Channel Pacing Threshold Amplitude: 0.375 V
Lead Channel Pacing Threshold Amplitude: 0.875 V
Lead Channel Pacing Threshold Pulse Width: 0.4 ms
Lead Channel Pacing Threshold Pulse Width: 0.4 ms
Lead Channel Sensing Intrinsic Amplitude: 1 mV
Lead Channel Sensing Intrinsic Amplitude: 1 mV
Lead Channel Sensing Intrinsic Amplitude: 15.875 mV
Lead Channel Sensing Intrinsic Amplitude: 15.875 mV
Lead Channel Setting Pacing Amplitude: 2.5 V
Lead Channel Setting Pacing Pulse Width: 0.4 ms
Lead Channel Setting Sensing Sensitivity: 1.2 mV
Zone Setting Status: 755011
Zone Setting Status: 755011

## 2022-02-14 ENCOUNTER — Telehealth: Payer: Self-pay

## 2022-02-14 NOTE — Telephone Encounter (Signed)
The patient daughter had several questions. I answered all of her questions and she thanked me for helping her.

## 2022-02-16 NOTE — Addendum Note (Signed)
Addended by: Douglass Rivers D on: 02/16/2022 10:26 AM   Modules accepted: Level of Service

## 2022-02-16 NOTE — Progress Notes (Signed)
Remote pacemaker transmission.   

## 2022-02-28 ENCOUNTER — Ambulatory Visit (INDEPENDENT_AMBULATORY_CARE_PROVIDER_SITE_OTHER): Payer: Medicare Other | Admitting: Allergy and Immunology

## 2022-02-28 VITALS — BP 142/80 | HR 77 | Temp 97.8°F | Resp 16 | Ht 69.0 in | Wt 182.3 lb

## 2022-02-28 DIAGNOSIS — J3089 Other allergic rhinitis: Secondary | ICD-10-CM | POA: Diagnosis not present

## 2022-02-28 DIAGNOSIS — K219 Gastro-esophageal reflux disease without esophagitis: Secondary | ICD-10-CM

## 2022-02-28 DIAGNOSIS — J4550 Severe persistent asthma, uncomplicated: Secondary | ICD-10-CM | POA: Diagnosis not present

## 2022-02-28 NOTE — Progress Notes (Signed)
1 

## 2022-02-28 NOTE — Progress Notes (Unsigned)
[place] - [place] - [place] - [place] - [place]   Follow-up Note  Referring Provider: Creola Corn, MD Primary Provider: Creola Corn, MD Date of Office Visit: 02/28/2022  Subjective:   Isaiah Murphy (DOB: 1934/05/02) is a 88 y.o. 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  His last visit to this clinic was 13 December 2021.  He is very pleased with the response he is currently receiving while utilizing a collection of anti-inflammatory medications for airway including the use of anti-TSLP antibody.  He does not require a short acting bronchodilator.  He is not really exercising to any significant degree.  If he does have cold air exposure he sometimes develops some coughing.  If he walks 100 yards he will sometimes get out of breath and needs to rest.  He had very little problems with his nose.  He has had very little problems with his reflux.  He has received flu vaccine, RSV vaccine, COVID-vaccine.  Allergies as of 02/28/2022       Reactions   Penicillins Rash   Has patient had a PCN reaction causing immediate rash, facial/tongue/throat swelling, SOB or lightheadedness with hypotension: Yes Has patient had a PCN reaction causing severe rash involving mucus membranes or skin necrosis: Yes Has patient had a PCN reaction that required hospitalization: No Has patient had a PCN reaction occurring within the last 10 years: No If all of the above answers are "NO", then may proceed with Cephalosporin use. Rash at the injection site        Medication List    albuterol 108 (90 Base) MCG/ACT inhaler Commonly known as: VENTOLIN HFA Inhale 2 puffs into the lungs every 4 (four) hours as needed for wheezing or shortness of breath.   Alvesco 160 MCG/ACT inhaler Generic drug: ciclesonide USE 1 INHALATION ORALLY TWICE DAILY TO  PREVENT COUGH OR WHEEZE. INCREASE TO USE 2 INHALATIONS ORALLY TWICE DAILY WITH ASTHMA FLARE. RINSE, GARGLE AND SPIT AFTER USE.   apixaban 5 MG Tabs tablet Commonly known as: Eliquis Take 1 tablet (5 mg total) by mouth 2 (two) times daily. Needs Cardiology Appointment. Please call office to schedule   atorvastatin 20 MG tablet Commonly known as: LIPITOR Take 20 mg by mouth at bedtime.   budesonide-formoterol 160-4.5 MCG/ACT inhaler Commonly known as: Symbicort Inhale 2 puffs into the lungs 2 (two) times daily.   dextromethorphan-guaiFENesin 30-600 MG 12hr tablet Commonly known as: MUCINEX DM Take 1 tablet by mouth 2 (two) times daily.   esomeprazole 40 MG capsule Commonly known as: NEXIUM Take 40 mg by mouth daily as needed (reflux).   famotidine 40 MG tablet Commonly known as: PEPCID Take 1 tablet (40 mg total) by mouth daily.   fluticasone 50 MCG/ACT nasal spray Commonly known as: FLONASE Place 1 spray into both nostrils 2 (two) times daily as needed for allergies or rhinitis.   levocetirizine 5 MG tablet Commonly known as: XYZAL Take 1 tablet (5 mg total) by mouth daily as needed (Can take an extra dose during flare ups.).   Mega Multi Men Tabs Take 1 tablet by mouth as needed.   montelukast 10 MG tablet Commonly known as: SINGULAIR Take 1 tablet (10 mg total) by mouth at bedtime.   olopatadine 0.1 % ophthalmic solution Commonly known as: PATANOL Place 1 drop into both eyes daily.   Spacer/Aero-Holding [employer]  Devi 1 Device by Does not apply route as directed.   triamcinolone cream 0.1 % Commonly known as: KENALOG Apply 1 application topically 2 (two) times daily as needed (itching).    Past Medical History:  Diagnosis Date   Acute blood loss anemia    Allergic rhinitis    Arthritis    Asthma    Chronic anticoagulation    Constipation    Dyslipidemia    Dysrhythmia    history of paroxysmal A Fib and typical A Flutter    GERD (gastroesophageal reflux  disease)    History of bronchitis    History of sick sinus syndrome    History of syncope    HLD (hyperlipidemia)    Hx of syncope    Hypertension    Hyponatremia    Leukocytosis    Moderate persistent asthma with acute exacerbation in adult    Osteoarthritis    s/p R & L THR   Presence of permanent cardiac pacemaker    Primary osteoarthritis of right hip    Sleep apnea    pt states not currently using CPAP machine    Unsteady gait     Past Surgical History:  Procedure Laterality Date   CARDIOVERSION N/A 04/28/2013   Procedure: CARDIOVERSION;  Surgeon: Dorothy Spark, MD;  Location: [place];  Service: Cardiovascular;  Laterality: N/A;   HEMORRHOID SURGERY     left shoulder surgery      secondary to torn ligament / subluxation   TEE WITHOUT CARDIOVERSION N/A 04/28/2013   Procedure: TRANSESOPHAGEAL ECHOCARDIOGRAM (TEE);  Surgeon: Dorothy Spark, MD;  Location: [place];  Service: Cardiovascular;  Laterality: N/A;   TOTAL HIP ARTHROPLASTY Right 03/08/2015   Procedure: RIGHT TOTAL HIP ARTHROPLASTY ANTERIOR APPROACH;  Surgeon: Gaynelle Arabian, MD;  Location: [facility];  Service: Orthopedics;  Laterality: Right;   TOTAL HIP ARTHROPLASTY Left 01/07/2020   Procedure: TOTAL HIP ARTHROPLASTY ANTERIOR APPROACH;  Surgeon: Gaynelle Arabian, MD;  Location: [facility];  Service: Orthopedics;  Laterality: Left;  14min    Review of systems negative except as noted in HPI / PMHx or noted below:  Review of Systems  Constitutional: Negative.   HENT: Negative.    Eyes: Negative.   Respiratory: Negative.    Cardiovascular: Negative.   Gastrointestinal: Negative.   Genitourinary: Negative.   Musculoskeletal: Negative.   Skin: Negative.   Neurological: Negative.   Endo/Heme/Allergies: Negative.   Psychiatric/Behavioral: Negative.       Objective:   Vitals:   02/28/22 1127  BP: (!) 142/80  Pulse: 77  Resp: 16  Temp: 97.8 F (36.6 C)  SpO2: 98%   Height: 5\' 9"  (175.3 cm)   Weight: 182 lb 4.8 oz (82.7 kg)   Physical Exam Constitutional:      Appearance: He is not diaphoretic.  HENT:     Head: Normocephalic.     Right Ear: Tympanic membrane, ear canal and external ear normal.     Left Ear: Tympanic membrane, ear canal and external ear normal.     Nose: Nose normal. No mucosal edema or rhinorrhea.     Mouth/Throat:     Pharynx: Uvula midline. No oropharyngeal exudate.  Eyes:     Conjunctiva/sclera: Conjunctivae normal.  Neck:     Thyroid: No thyromegaly.     Trachea: Trachea normal. No tracheal tenderness or tracheal deviation.  Cardiovascular:     Rate and Rhythm: Normal rate and regular rhythm.     Heart sounds: Normal heart sounds, S1 normal  and S2 normal. No murmur heard. Pulmonary:     Effort: No respiratory distress.     Breath sounds: Normal breath sounds. No stridor. No wheezing or rales.  Lymphadenopathy:     Head:     Right side of head: No tonsillar adenopathy.     Left side of head: No tonsillar adenopathy.     Cervical: No cervical adenopathy.  Skin:    Findings: No erythema or rash.     Nails: There is no clubbing.  Neurological:     Mental Status: He is alert.     Diagnostics:    Spirometry was performed and demonstrated an FEV1 of 1.53 at 46 % of predicted.  Assessment and Plan:   1. Asthma, severe persistent, well-controlled   2. Perennial allergic rhinitis   3. Gastroesophageal reflux disease, unspecified whether esophagitis present    1.  Treat and prevent inflammation of airway:   A. Breztri - 2 inhalations 2 times per day w/ spacer  B. Flonase - 1 spray each nostril 1-2  times per day C. Montelukast 10 mg - 1 tablet 1 time per day D. Tezepelumab injections every 4 weeks.  2.  Treat and prevent reflux:  A.  Esomeprazole 40 mg - 1 tablet 1-2 times per day  3. If needed:  A.  Famotidine 40 mg - 1 tablet in evening  B. Albuterol HFA  - 2 inhalations every 4-6 hours  4. Return to clinic in Summer 2024 or  earlier if problem  5. 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  I am not really going to change much of his therapy today.  I did have a talk with him today about undergoing some form of structured aerobic exercise program as that is the one nonpharmacologic manipulation he can perform to help his breathing.  Allena Katz, MD Allergy / Immunology [place]

## 2022-02-28 NOTE — Patient Instructions (Signed)
  1.  Treat and prevent inflammation of airway:   A. Breztri - 2 inhalations 2 times per day w/ spacer  B. Flonase - 1 spray each nostril 1-2  times per day C. Montelukast 10 mg - 1 tablet 1 time per day D. Tezepelumab injections every 4 weeks.  2.  Treat and prevent reflux:  A.  Esomeprazole 40 mg - 1 tablet 1-2 times per day  3. If needed:  A.  Famotidine 40 mg - 1 tablet in evening  B. Albuterol HFA  - 2 inhalations every 4-6 hours  4. Return to clinic in Summer 2024 or earlier if problem  5. Structured exercise program

## 2022-03-01 ENCOUNTER — Encounter: Payer: Self-pay | Admitting: Allergy and Immunology

## 2022-03-01 MED ORDER — LEVOCETIRIZINE DIHYDROCHLORIDE 5 MG PO TABS: 5.0000 mg | ORAL_TABLET | Freq: Every day | ORAL | 1 refills | Status: DC | PRN

## 2022-03-01 MED ORDER — OLOPATADINE HCL 0.1 % OP SOLN: 1.0000 [drp] | Freq: Every day | OPHTHALMIC | 1 refills | Status: AC

## 2022-03-01 MED ORDER — ALBUTEROL SULFATE HFA 108 (90 BASE) MCG/ACT IN AERS: 2.0000 | INHALATION_SPRAY | RESPIRATORY_TRACT | 1 refills | Status: DC | PRN

## 2022-03-01 MED ORDER — BREZTRI AEROSPHERE 160-9-4.8 MCG/ACT IN AERO: 2.0000 | INHALATION_SPRAY | Freq: Two times a day (BID) | RESPIRATORY_TRACT | 1 refills | Status: DC

## 2022-03-01 MED ORDER — MONTELUKAST SODIUM 10 MG PO TABS: 10.0000 mg | ORAL_TABLET | Freq: Every evening | ORAL | 1 refills | Status: DC

## 2022-03-01 MED ORDER — FAMOTIDINE 40 MG PO TABS: 40.0000 mg | ORAL_TABLET | Freq: Every day | ORAL | 1 refills | Status: DC

## 2022-03-01 MED ORDER — SPACER/AERO-HOLDING CHAMBERS DEVI: 1.0000 | 2 refills | Status: DC

## 2022-03-01 MED ORDER — TRIAMCINOLONE ACETONIDE 0.1 % EX CREA: 1.0000 | TOPICAL_CREAM | Freq: Two times a day (BID) | CUTANEOUS | 1 refills | Status: AC | PRN

## 2022-03-01 MED ORDER — FLUTICASONE PROPIONATE 50 MCG/ACT NA SUSP: 1.0000 | Freq: Two times a day (BID) | NASAL | 1 refills | Status: DC | PRN

## 2022-03-09 ENCOUNTER — Ambulatory Visit (INDEPENDENT_AMBULATORY_CARE_PROVIDER_SITE_OTHER): Payer: Medicare Other

## 2022-03-09 DIAGNOSIS — J4550 Severe persistent asthma, uncomplicated: Secondary | ICD-10-CM

## 2022-03-09 NOTE — Progress Notes (Signed)
Remote pacemaker transmission.   

## 2022-03-13 ENCOUNTER — Ambulatory Visit: Payer: Medicare Other

## 2022-03-13 DIAGNOSIS — I495 Sick sinus syndrome: Secondary | ICD-10-CM

## 2022-03-14 LAB — CUP PACEART REMOTE DEVICE CHECK
Battery Remaining Longevity: 1 mo
Battery Voltage: 2.83 V
Brady Statistic RA Percent Paced: 0 %
Brady Statistic RV Percent Paced: 99.67 %
Date Time Interrogation Session: 20240206132407
Implantable Lead Connection Status: 753985
Implantable Lead Connection Status: 753985
Implantable Lead Implant Date: 20150413
Implantable Lead Implant Date: 20150413
Implantable Lead Location: 753859
Implantable Lead Location: 753860
Implantable Lead Model: 5076
Implantable Lead Model: 5076
Implantable Pulse Generator Implant Date: 20150413
Lead Channel Impedance Value: 361 Ohm
Lead Channel Impedance Value: 399 Ohm
Lead Channel Impedance Value: 418 Ohm
Lead Channel Impedance Value: 513 Ohm
Lead Channel Pacing Threshold Amplitude: 0.375 V
Lead Channel Pacing Threshold Amplitude: 1 V
Lead Channel Pacing Threshold Pulse Width: 0.4 ms
Lead Channel Pacing Threshold Pulse Width: 0.4 ms
Lead Channel Sensing Intrinsic Amplitude: 1.75 mV
Lead Channel Sensing Intrinsic Amplitude: 1.75 mV
Lead Channel Sensing Intrinsic Amplitude: 28.5 mV
Lead Channel Sensing Intrinsic Amplitude: 28.5 mV
Lead Channel Setting Pacing Amplitude: 2.5 V
Lead Channel Setting Pacing Pulse Width: 0.4 ms
Lead Channel Setting Sensing Sensitivity: 1.2 mV
Zone Setting Status: 755011
Zone Setting Status: 755011

## 2022-03-16 ENCOUNTER — Telehealth: Payer: Self-pay | Admitting: *Deleted

## 2022-03-16 NOTE — Telephone Encounter (Signed)
Can we please do a PA for Levocetirizine 2 times daily if needed for 180 tablets.

## 2022-03-17 ENCOUNTER — Other Ambulatory Visit (HOSPITAL_COMMUNITY): Payer: Self-pay

## 2022-03-17 ENCOUNTER — Telehealth: Payer: Self-pay

## 2022-03-17 NOTE — Telephone Encounter (Signed)
PA has been APPROVED from 03/17/2022-03/17/2023

## 2022-03-17 NOTE — Telephone Encounter (Signed)
PA has been submitted and is pending determination. Will be updated in additional encounter created.

## 2022-03-17 NOTE — Telephone Encounter (Signed)
PA request received via provider for Levocetirizine Dihydrochloride 5MG tablets  PA has been submitted via CMM to [facility] and is pending determination for 180tabs/90days.  Key: [id number]

## 2022-03-30 ENCOUNTER — Telehealth: Payer: Self-pay | Admitting: Interventional Cardiology

## 2022-03-30 NOTE — Telephone Encounter (Signed)
Returned call to Pt. Advised pacemaker working normally, no episodes noted.  Advised that his device is RRT as of 03/21/2022 and he will get a call to schedule his generator change.  Pt thanked nurse for call back.

## 2022-03-30 NOTE — Telephone Encounter (Signed)
Pt c/o of Chest Pain: STAT if CP now or developed within 24 hours  1. Are you having CP right now?   No  2. Are you experiencing any other symptoms (ex. SOB, nausea, vomiting, sweating)?   No  3. How long have you been experiencing CP?   Around 12:05 am  4. Is your CP continuous or coming and going?   Happened once in the front of his chest then in the back of his chest  5. Have you taken Nitroglycerin?   No  Patient stated he had a sharp pain in his chest twice this morning.  Patient stated he is due for a battery change for his pacemaker.   ?

## 2022-03-30 NOTE — Telephone Encounter (Signed)
Call transferred to triage for a sharp left chest pain at 12:05 am and few seconds later the same pain in the left back.  He has been feeling completely fine otherwise and "slept like a log".  He is going to send a device transmission and device triage will call him after reviewing.  Pt in agreement and will send now.

## 2022-04-03 ENCOUNTER — Other Ambulatory Visit: Payer: Self-pay

## 2022-04-03 DIAGNOSIS — I484 Atypical atrial flutter: Secondary | ICD-10-CM

## 2022-04-03 NOTE — Telephone Encounter (Signed)
Prescription refill request for Eliquis received. Indication: Afib  Last office visit: 01/16/22 Lovena Le)  Scr: overdue Age: 88 Weight: 82.7kg  Labs overdue. Called pt and scheduled lab appt for tomorrow at [facility] lab. Pt state he is completely out of medication. 30 day supply sent to requested pharmacy. Instructed pt to go to labs tomorrow for future refills. Pt verbalized understanding.

## 2022-04-04 MED ORDER — APIXABAN 5 MG PO TABS: 5.0000 mg | ORAL_TABLET | Freq: Two times a day (BID) | ORAL | 0 refills | Status: DC

## 2022-04-05 ENCOUNTER — Ambulatory Visit: Payer: Medicare Other | Attending: Internal Medicine

## 2022-04-05 DIAGNOSIS — I484 Atypical atrial flutter: Secondary | ICD-10-CM

## 2022-04-05 NOTE — Telephone Encounter (Signed)
No answer, VM full.Marland KitchenMarland KitchenMarland Kitchen

## 2022-04-05 NOTE — Telephone Encounter (Signed)
Patient is returning call. Transferred to [place], [place].

## 2022-04-05 NOTE — Telephone Encounter (Signed)
Pt is scheduled for 04/10/22 @ 3:30 (ok per GT)  He had labs done today. He is aware to hold his Eliquis x 2 days.

## 2022-04-06 ENCOUNTER — Ambulatory Visit (INDEPENDENT_AMBULATORY_CARE_PROVIDER_SITE_OTHER): Payer: Medicare Other

## 2022-04-06 DIAGNOSIS — J4550 Severe persistent asthma, uncomplicated: Secondary | ICD-10-CM | POA: Diagnosis not present

## 2022-04-06 LAB — BASIC METABOLIC PANEL
BUN/Creatinine Ratio: 12 (ref 10–24)
BUN: 15 mg/dL (ref 8–27)
CO2: 20 mmol/L (ref 20–29)
Calcium: 8.9 mg/dL (ref 8.6–10.2)
Chloride: 102 mmol/L (ref 96–106)
Creatinine, Ser: 1.27 mg/dL (ref 0.76–1.27)
Glucose: 109 mg/dL — ABNORMAL HIGH (ref 70–99)
Potassium: 4.7 mmol/L (ref 3.5–5.2)
Sodium: 142 mmol/L (ref 134–144)
eGFR: 55 mL/min/{1.73_m2} — ABNORMAL LOW (ref >59)

## 2022-04-06 LAB — CBC
Hematocrit: 46 % (ref 37.5–51.0)
Hemoglobin: 16.3 g/dL (ref 13.0–17.7)
MCH: 34.4 pg — ABNORMAL HIGH (ref 26.6–33.0)
MCHC: 35.4 g/dL (ref 31.5–35.7)
MCV: 97 fL (ref 79–97)
Platelets: 175 10*3/uL (ref 150–450)
RBC: 4.74 x10E6/uL (ref 4.14–5.80)
RDW: 12.5 % (ref 11.6–15.4)
WBC: 7.1 10*3/uL (ref 3.4–10.8)

## 2022-04-07 NOTE — Pre-Procedure Instructions (Signed)
Instructed patient on the following items: Arrival time 1:30 Nothing to eat or drink after midnight No meds AM of procedure Responsible person to drive you home and stay with you for 24 hrs Wash with special soap night before and morning of procedure If on anti-coagulant drug instructions Eliquis- last dose today 3/1

## 2022-04-10 ENCOUNTER — Encounter (HOSPITAL_COMMUNITY)
Admission: RE | Disposition: A | Discharge status: Home / Self Care | Payer: Self-pay | Source: Home / Self Care | Attending: Internal Medicine

## 2022-04-10 ENCOUNTER — Other Ambulatory Visit: Payer: Self-pay

## 2022-04-10 ENCOUNTER — Ambulatory Visit (HOSPITAL_COMMUNITY)
Admission: RE | Admit: 2022-04-10 | Discharge: 2022-04-10 | Disposition: A | Discharge status: Home / Self Care | Payer: Medicare Other | Attending: Internal Medicine | Admitting: Internal Medicine

## 2022-04-10 DIAGNOSIS — I495 Sick sinus syndrome: Secondary | ICD-10-CM | POA: Insufficient documentation

## 2022-04-10 DIAGNOSIS — Z45010 Encounter for checking and testing of cardiac pacemaker pulse generator [battery]: Secondary | ICD-10-CM | POA: Insufficient documentation

## 2022-04-10 DIAGNOSIS — I480 Paroxysmal atrial fibrillation: Secondary | ICD-10-CM | POA: Insufficient documentation

## 2022-04-10 DIAGNOSIS — I4819 Other persistent atrial fibrillation: Secondary | ICD-10-CM | POA: Diagnosis not present

## 2022-04-10 DIAGNOSIS — I442 Atrioventricular block, complete: Secondary | ICD-10-CM

## 2022-04-10 DIAGNOSIS — I10 Essential (primary) hypertension: Secondary | ICD-10-CM | POA: Insufficient documentation

## 2022-04-10 HISTORY — PX: PPM GENERATOR CHANGEOUT: EP1233

## 2022-04-10 SURGERY — PPM GENERATOR CHANGEOUT

## 2022-04-10 MED ORDER — MIDAZOLAM HCL 5 MG/5ML IJ SOLN
INTRAMUSCULAR | Status: AC
  Filled 2022-04-10: qty 5

## 2022-04-10 MED ORDER — SODIUM CHLORIDE 0.9 % IV SOLN
INTRAVENOUS | Status: AC
  Filled 2022-04-10: qty 2

## 2022-04-10 MED ORDER — POVIDONE-IODINE 10 % EX SWAB
2.0000 | Freq: Once | CUTANEOUS | Status: AC
  Administered 2022-04-10: 2 via TOPICAL

## 2022-04-10 MED ORDER — FENTANYL CITRATE (PF) 100 MCG/2ML IJ SOLN
INTRAMUSCULAR | Status: AC
  Filled 2022-04-10: qty 2

## 2022-04-10 MED ORDER — SODIUM CHLORIDE 0.9 % IV SOLN
80.0000 mg | INTRAVENOUS | Status: AC
  Administered 2022-04-10: 80 mg

## 2022-04-10 MED ORDER — ACETAMINOPHEN 325 MG PO TABS: 325.0000 mg | ORAL_TABLET | ORAL | Status: DC | PRN

## 2022-04-10 MED ORDER — SODIUM CHLORIDE 0.9 % IV SOLN: INTRAVENOUS | Status: DC

## 2022-04-10 MED ORDER — CHLORHEXIDINE GLUCONATE 4 % EX LIQD
4.0000 | Freq: Once | CUTANEOUS | Status: DC
  Filled 2022-04-10: qty 60

## 2022-04-10 MED ORDER — LIDOCAINE HCL (PF) 1 % IJ SOLN
INTRAMUSCULAR | Status: DC | PRN
  Administered 2022-04-10: 55 mL

## 2022-04-10 MED ORDER — LIDOCAINE HCL 1 % IJ SOLN
INTRAMUSCULAR | Status: AC
  Filled 2022-04-10: qty 60

## 2022-04-10 MED ORDER — ONDANSETRON HCL 4 MG/2ML IJ SOLN: 4.0000 mg | Freq: Four times a day (QID) | INTRAMUSCULAR | Status: DC | PRN

## 2022-04-10 MED ORDER — VANCOMYCIN HCL IN DEXTROSE 1-5 GM/200ML-% IV SOLN
INTRAVENOUS | Status: AC
  Filled 2022-04-10: qty 200

## 2022-04-10 MED ORDER — VANCOMYCIN HCL IN DEXTROSE 1-5 GM/200ML-% IV SOLN
1000.0000 mg | INTRAVENOUS | Status: AC
  Administered 2022-04-10: 1000 mg via INTRAVENOUS

## 2022-04-10 MED ORDER — FENTANYL CITRATE (PF) 100 MCG/2ML IJ SOLN
INTRAMUSCULAR | Status: DC | PRN
  Administered 2022-04-10: 25 ug via INTRAVENOUS

## 2022-04-10 MED ORDER — MIDAZOLAM HCL 5 MG/5ML IJ SOLN
INTRAMUSCULAR | Status: DC | PRN
  Administered 2022-04-10: 2 mg via INTRAVENOUS

## 2022-04-10 SURGICAL SUPPLY — 7 items
CABLE SURGICAL S-101-97-12 (CABLE) ×1 IMPLANT
IPG PACE AZUR XT DR MRI W1DR01 (Pacemaker) IMPLANT
PACE AZURE XT DR MRI W1DR01 (Pacemaker) ×1 IMPLANT
PAD DEFIB RADIO PHYSIO CONN (PAD) ×1 IMPLANT
POUCH AIGIS-R ANTIBACT PPM (Mesh General) ×1 IMPLANT
POUCH AIGIS-R ANTIBACT PPM MED (Mesh General) IMPLANT
TRAY PACEMAKER INSERTION (PACKS) ×1 IMPLANT

## 2022-04-10 NOTE — H&P (Signed)
HPI Isaiah Murphy returns today for ongoing evaluation and management of his PPM. He is a pleasant 88 yo man with a h/o HTN, sinus node dysfunction and CHB, who is s/p PPM insertion. He has moved back to [place] from [place]. He has PAF. he does not have palpitations.       Allergies  Allergen Reactions   Penicillins Rash      Has patient had a PCN reaction causing immediate rash, facial/tongue/throat swelling, SOB or lightheadedness with hypotension: Yes Has patient had a PCN reaction causing severe rash involving mucus membranes or skin necrosis: Yes Has patient had a PCN reaction that required hospitalization: No Has patient had a PCN reaction occurring within the last 10 years: No If all of the above answers are "NO", then may proceed with Cephalosporin use. Rash at the injection site              Current Outpatient Medications  Medication Sig Dispense Refill   albuterol (VENTOLIN HFA) 108 (90 Base) MCG/ACT inhaler Inhale 2 puffs into the lungs every 4 (four) hours as needed for wheezing or shortness of breath. 18 g 1   apixaban (ELIQUIS) 5 MG TABS tablet Take 1 tablet (5 mg total) by mouth 2 (two) times daily. Needs Cardiology Appointment. Please call office to schedule 180 tablet 0   atorvastatin (LIPITOR) 20 MG tablet Take 20 mg by mouth at bedtime.        budesonide-formoterol (SYMBICORT) 160-4.5 MCG/ACT inhaler Inhale 2 puffs into the lungs 2 (two) times daily. 1 each 5   ciclesonide (ALVESCO) 160 MCG/ACT inhaler USE 1 INHALATION ORALLY TWICE DAILY TO PREVENT COUGH OR WHEEZE. INCREASE TO USE 2 INHALATIONS ORALLY TWICE DAILY WITH ASTHMA FLARE. RINSE, GARGLE AND SPIT AFTER USE. 18.3 g 4   dextromethorphan-guaiFENesin (MUCINEX DM) 30-600 MG 12hr tablet Take 1 tablet by mouth 2 (two) times daily. 60 tablet 3   esomeprazole (NEXIUM) 40 MG capsule Take 40 mg by mouth daily as needed (reflux).       famotidine (PEPCID) 40 MG tablet Take 1 tablet (40 mg total) by mouth daily.  30 tablet 5   fluticasone (FLONASE) 50 MCG/ACT nasal spray Place 1 spray into both nostrils 2 (two) times daily as needed for allergies or rhinitis. 16 g 11   ketorolac (ACULAR) 0.5 % ophthalmic solution 1 drop. Affected eye as needed       levocetirizine (XYZAL) 5 MG tablet Take 1 tablet (5 mg total) by mouth daily as needed (Can take an extra dose during flare ups.). 60 tablet 5   montelukast (SINGULAIR) 10 MG tablet Take 1 tablet (10 mg total) by mouth at bedtime. 30 tablet 11   Multiple Vitamins-Minerals (MEGA MULTI MEN) TABS Take 1 tablet by mouth as needed.       olopatadine (PATANOL) 0.1 % ophthalmic solution Place 1 drop into both eyes daily.       Spacer/Aero-Holding [employer] DEVI 1 Device by Does not apply route as directed. 1 each 2   triamcinolone (KENALOG) 0.1 % Apply 1 application topically 2 (two) times daily as needed (itching). 80 g 3             Current Facility-Administered Medications  Medication Dose Route Frequency Provider Last Rate Last Admin   tezepelumab-ekko (TEZSPIRE) 210 MG/1.91ML syringe 210 mg  210 mg Subcutaneous Q28 days Jiles Prows, MD   210 mg at 01/12/22 1043  Past Medical History:  Diagnosis Date   Acute blood loss anemia     Allergic rhinitis     Arthritis     Asthma     Chronic anticoagulation     Constipation     Dyslipidemia     Dysrhythmia      history of paroxysmal A Fib and typical A Flutter    GERD (gastroesophageal reflux disease)     History of bronchitis     History of sick sinus syndrome     History of syncope     HLD (hyperlipidemia)     Hx of syncope     Hypertension     Hyponatremia     Leukocytosis     Moderate persistent asthma with acute exacerbation in adult     Osteoarthritis      s/p R & L THR   Presence of permanent cardiac pacemaker     Primary osteoarthritis of right hip     Sleep apnea      pt states not currently using CPAP machine    Unsteady gait        ROS:    All systems reviewed and  negative except as noted in the HPI.          Past Surgical History:  Procedure Laterality Date   CARDIOVERSION N/A 04/28/2013    Procedure: CARDIOVERSION;  Surgeon: Dorothy Spark, MD;  Location: [place];  Service: Cardiovascular;  Laterality: N/A;   HEMORRHOID SURGERY       left shoulder surgery         secondary to torn ligament / subluxation   TEE WITHOUT CARDIOVERSION N/A 04/28/2013    Procedure: TRANSESOPHAGEAL ECHOCARDIOGRAM (TEE);  Surgeon: Dorothy Spark, MD;  Location: [place];  Service: Cardiovascular;  Laterality: N/A;   TOTAL HIP ARTHROPLASTY Right 03/08/2015    Procedure: RIGHT TOTAL HIP ARTHROPLASTY ANTERIOR APPROACH;  Surgeon: Gaynelle Arabian, MD;  Location[facility];  Service: Orthopedics;  Laterality: Right;   TOTAL HIP ARTHROPLASTY Left 01/07/2020    Procedure: TOTAL HIP ARTHROPLASTY ANTERIOR APPROACH;  Surgeon: Gaynelle Arabian, MD;  Location: [facility];  Service: Orthopedics;  Laterality: Left;  175mn             Family History  Problem Relation Age of Onset   Hypertension Father     Allergic rhinitis Neg Hx     Asthma Neg Hx     Eczema Neg Hx     Immunodeficiency Neg Hx     Urticaria Neg Hx     Sleep apnea Neg Hx          Social History         Socioeconomic History   Marital status: Married      Spouse name: Not on file   Number of children: Not on file   Years of education: Not on file   Highest education level: Not on file  Occupational History   Not on file  Tobacco Use   Smoking status: Never   Smokeless tobacco: Never  Vaping Use   Vaping Use: Never used  Substance and Sexual Activity   Alcohol use: Yes      Alcohol/week: 0.0 standard drinks of alcohol      Comment: socially    Drug use: No   Sexual activity: Not on file  Other Topics Concern   Not on file  Social History Narrative    ** Merged History Encounter **  Married, Research scientist (physical sciences) in [place] Strain: Not on [employer] Insecurity: Not on file  Transportation Needs: Not on file  Physical Activity: Not on file  Stress: Not on file  Social Connections: Not on file  Intimate Partner Violence: Not on file        BP (!) 140/78   Pulse 86   Ht 5' 9.5" (1.765 m)   Wt 180 lb 6.4 oz (81.8 kg)   SpO2 98%   BMI 26.26 kg/m    Physical Exam:   Well appearing NAD HEENT: Unremarkable Neck:  No JVD, no thyromegally Lymphatics:  No adenopathy Back:  No CVA tenderness Lungs:  Clear HEART:  Regular rate rhythm, no murmurs, no rubs, no clicks Abd:  soft, positive bowel sounds, no organomegally, no rebound, no guarding Ext:  2 plus pulses, no edema, no cyanosis, no clubbing Skin:  No rashes no nodules Neuro:  CN II through XII intact, motor grossly intact     DEVICE  Normal device function.  See PaceArt for details. 2 months to ERI   Assess/Plan:   Tachybrady syndrome - he is maintaining NSR. He is asymptomatic. 2. CHB - he is asymptomatic. He has no escape today. 3. PPM - his medtronic DDD PM is working normally. We will recheck in several months. He is about 2 months from ERI. 4. HTN - his sbp is up a bit. No change in his meds today. He will maintain a low sodium diet.   Carleene Overlie Isaiah Gripp,MD

## 2022-04-10 NOTE — Discharge Instructions (Addendum)
Implantable Cardiac Device Battery Change, Care After  This sheet gives you information about how to care for yourself after your procedure. Your health care provider may also give you more specific instructions. If you have problems or questions, contact your health care provider. What can I expect after the procedure? After your procedure, it is common to have: Pain or soreness at the site where the cardiac device was inserted. Swelling at the site where the cardiac device was inserted. You should received an information card for your new device in 4-8 weeks. Follow these instructions at home: Incision care  Keep the incision clean and dry. Do not take baths, swim, or use a hot tub until after your wound check.  Do not shower for at least 7 days, or as directed by your health care provider. Pat the area dry with a clean towel. Do not rub the area. This may cause bleeding. Follow instructions from your health care provider about how to take care of your incision. Make sure you: Remove outer dressing in 24 hours. Underneath the dressing are adhesive strips.  These skin closures may need to stay in place for 2 weeks or longer. If adhesive strip edges start to loosen and curl up, you may trim the loose edges. Do not remove adhesive strips completely unless your health care provider tells you to do that. Check your incision area every day for signs of infection. Check for: More redness, swelling, or pain. More fluid or blood. Warmth. Pus or a bad smell. Activity Do not lift anything that is heavier than 10 lb (4.5 kg) until your health care provider says it is okay to do so. For the first week, or as long as told by your health care provider: Avoid lifting your affected arm higher than your shoulder. After 1 week, Be gentle when you move your arms over your head. It is okay to raise your arm to comb your hair. Avoid strenuous exercise. Ask your health care provider when it is okay to: Resume  your normal activities. Return to work or school. Resume sexual activity. Eating and drinking Eat a heart-healthy diet. This should include plenty of fresh fruits and vegetables, whole grains, low-fat dairy products, and lean protein like chicken and fish. Limit alcohol intake to no more than 1 drink a day for non-pregnant women and 2 drinks a day for men. One drink equals 12 oz of beer, 5 oz of wine, or 1 oz of hard liquor. Check ingredients and nutrition facts on packaged foods and beverages. Avoid the following types of food: Food that is high in salt (sodium). Food that is high in saturated fat, like full-fat dairy or red meat. Food that is high in trans fat, like fried food. Food and drinks that are high in sugar. Lifestyle Do not use any products that contain nicotine or tobacco, such as cigarettes and e-cigarettes. If you need help quitting, ask your health care provider. Take steps to manage and control your weight. Once cleared, get regular exercise. Aim for 150 minutes of moderate-intensity exercise (such as walking or yoga) or 75 minutes of vigorous exercise (such as running or swimming) each week. Manage other health problems, such as diabetes or high blood pressure. Ask your health care provider how you can manage these conditions. General instructions Do not drive for 24 hours after your procedure if you were given a medicine to help you relax (sedative). Take over-the-counter and prescription medicines only as told by your health care provider.  Avoid putting pressure on the area where the cardiac device was placed. If you need an MRI after your cardiac device has been placed, be sure to tell the health care provider who orders the MRI that you have a cardiac device. Avoid close and prolonged exposure to electrical devices that have strong magnetic fields. These include: Cell phones. Avoid keeping them in a pocket near the cardiac device, and try using the ear opposite the  cardiac device. MP3 players. Household appliances, like microwaves. Metal detectors. Electric generators. High-tension wires. Keep all follow-up visits as directed by your health care provider. This is important. Contact a health care provider if: You have pain at the incision site that is not relieved by over-the-counter or prescription medicines. You have any of these around your incision site or coming from it: More redness, swelling, or pain. Fluid or blood. Warmth to the touch. Pus or a bad smell. You have a fever. You feel brief, occasional palpitations, light-headedness, or any symptoms that you think might be related to your heart. Get help right away if: You experience chest pain that is different from the pain at the cardiac device site. You develop a red streak that extends above or below the incision site. You experience shortness of breath. You have palpitations or an irregular heartbeat. You have light-headedness that does not go away quickly. You faint or have dizzy spells. Your pulse suddenly drops or increases rapidly and does not return to normal. You begin to gain weight and your legs and ankles swell. Summary After your procedure, it is common to have pain, soreness, and some swelling where the cardiac device was inserted. Make sure to keep your incision clean and dry. Follow instructions from your health care provider about how to take care of your incision. Check your incision every day for signs of infection, such as more pain or swelling, pus or a bad smell, warmth, or leaking fluid and blood. Avoid strenuous exercise and lifting your left arm higher than your shoulder for 2 weeks, or as long as told by your health care provider. This information is not intended to replace advice given to you by your health care provider. Make sure you discuss any questions you have with your health care provider.

## 2022-04-11 ENCOUNTER — Encounter (HOSPITAL_COMMUNITY): Payer: Self-pay | Admitting: Internal Medicine

## 2022-04-13 ENCOUNTER — Ambulatory Visit: Payer: Medicare Other

## 2022-04-14 ENCOUNTER — Other Ambulatory Visit: Payer: Self-pay

## 2022-04-14 DIAGNOSIS — I484 Atypical atrial flutter: Secondary | ICD-10-CM

## 2022-04-14 MED ORDER — APIXABAN 5 MG PO TABS: 5.0000 mg | ORAL_TABLET | Freq: Two times a day (BID) | ORAL | 1 refills | Status: DC

## 2022-04-14 NOTE — Telephone Encounter (Signed)
Pt last saw Dr Lovena Le 01/16/22, last labs 04/05/22 Creat 1.27, age 88, weight 82.6kg, based on specified criteria pt is on appropriate dosage of Eliquis '5mg'$  BID for afib.  Will refill rx.

## 2022-04-15 ENCOUNTER — Telehealth: Payer: Self-pay | Admitting: Cardiology

## 2022-04-15 NOTE — Telephone Encounter (Signed)
Patient's daughter called the answering service today after noticing that the patient had a red line and some blistering below his pacemaker site.  Area is about 4 inches below the pacemaker site and is very itchy. Daughter reports that patient had a piece of tape placed in this area after his procedure. Patient's daughter is able to see the outline where the tape previously was on his abdomen. I told patient's daughter that they can use over the counter anti-itch creams or hydrocortisone cream to help reduce the itching in the area.   Per daughter, pacemaker site is without bleeding, inflammation, purulence, pain, swelling. I told them to keep a close eye on the site and to notify the office if changes occur.   Margie Billet, PA-C 04/15/2022 12:31 PM

## 2022-04-26 ENCOUNTER — Ambulatory Visit: Payer: Federal, State, Local not specified - PPO | Admitting: Neurology

## 2022-04-26 ENCOUNTER — Ambulatory Visit: Payer: Medicare Other | Attending: Internal Medicine

## 2022-04-26 DIAGNOSIS — I442 Atrioventricular block, complete: Secondary | ICD-10-CM | POA: Diagnosis not present

## 2022-04-26 LAB — CUP PACEART INCLINIC DEVICE CHECK
Battery Remaining Longevity: 155 mo
Battery Voltage: 3.21 V
Brady Statistic RA Percent Paced: 0 %
Brady Statistic RV Percent Paced: 99.89 %
Date Time Interrogation Session: 20240320105650
Implantable Lead Connection Status: 753985
Implantable Lead Connection Status: 753985
Implantable Lead Implant Date: 20150413
Implantable Lead Implant Date: 20150413
Implantable Lead Location: 753859
Implantable Lead Location: 753860
Implantable Lead Model: 5076
Implantable Lead Model: 5076
Implantable Pulse Generator Implant Date: 20240304
Lead Channel Impedance Value: 361 Ohm
Lead Channel Impedance Value: 380 Ohm
Lead Channel Impedance Value: 456 Ohm
Lead Channel Impedance Value: 532 Ohm
Lead Channel Pacing Threshold Amplitude: 0.75 V
Lead Channel Pacing Threshold Amplitude: 0.875 V
Lead Channel Pacing Threshold Pulse Width: 0.4 ms
Lead Channel Pacing Threshold Pulse Width: 0.4 ms
Lead Channel Sensing Intrinsic Amplitude: 0.875 mV
Lead Channel Sensing Intrinsic Amplitude: 2.75 mV
Lead Channel Setting Pacing Amplitude: 2 V
Lead Channel Setting Pacing Pulse Width: 0.4 ms
Lead Channel Setting Sensing Sensitivity: 2 mV
Zone Setting Status: 755011

## 2022-04-26 NOTE — Progress Notes (Signed)
Wound check appointment. Steri-strips removed. Wound without redness or edema. Incision edges approximated, wound well healed. Normal device function. Thresholds, sensing, and impedances consistent with implant measurements. Device programmed with appropriate safety margin for chronic leads.  Procedure was a gen change only.  Histogram distribution appropriate for patient and level of activity. AT/AF Burden 100%, known AF on OAC, no high ventricular rates noted. Patient educated about wound care.  No restrictions on arm movement due having chronic leads.  ROV in 3 months with implanting physician.

## 2022-04-26 NOTE — Patient Instructions (Signed)

## 2022-04-26 NOTE — Addendum Note (Signed)
Addended by: Douglass Rivers D on: 04/26/2022 03:30 PM   Modules accepted: Level of Service

## 2022-04-26 NOTE — Progress Notes (Signed)
Remote pacemaker transmission.   

## 2022-05-02 ENCOUNTER — Encounter: Payer: Self-pay | Admitting: Neurology

## 2022-05-02 ENCOUNTER — Ambulatory Visit (INDEPENDENT_AMBULATORY_CARE_PROVIDER_SITE_OTHER): Payer: Medicare Other | Admitting: Neurology

## 2022-05-02 VITALS — BP 148/90 | HR 74 | Ht 69.0 in | Wt 183.0 lb

## 2022-05-02 DIAGNOSIS — G4733 Obstructive sleep apnea (adult) (pediatric): Secondary | ICD-10-CM | POA: Diagnosis not present

## 2022-05-02 DIAGNOSIS — Z7189 Other specified counseling: Secondary | ICD-10-CM

## 2022-05-02 DIAGNOSIS — G4731 Primary central sleep apnea: Secondary | ICD-10-CM | POA: Diagnosis not present

## 2022-05-02 DIAGNOSIS — R030 Elevated blood-pressure reading, without diagnosis of hypertension: Secondary | ICD-10-CM | POA: Diagnosis not present

## 2022-05-02 DIAGNOSIS — Z9989 Dependence on other enabling machines and devices: Secondary | ICD-10-CM

## 2022-05-02 NOTE — Patient Instructions (Signed)
Please continue using your ASV regularly. While your insurance requires that you use the machine at least 4 hours each night on 70% of the nights, I recommend, that you not skip any nights and use it throughout the night if you can. Getting used to using a PAP (positive airway pressure) machine and staying with the treatment long term does take time and patience and discipline. Untreated obstructive sleep apnea when it is moderate to severe can have an adverse impact on cardiovascular health and raise her risk for heart disease, arrhythmias, hypertension, congestive heart failure, stroke and diabetes. Untreated obstructive sleep apnea causes sleep disruption, nonrestorative sleep, and sleep deprivation. This can have an impact on your day to day functioning and cause daytime sleepiness and impairment of cognitive function, memory loss, mood disturbance, and problems focussing. Using a PAP machine regularly can improve these symptoms.  You can monitor your blood pressure for the next few days. Please get in touch with Dr. Virgina Jock, if it stays up.

## 2022-05-02 NOTE — Progress Notes (Signed)
Subjective:    Patient ID: Isaiah Murphy is a 88 y.o. male.  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  The patient is unaccompanied today.  I last saw him on 10/26/2021, at which time he was compliant with his ASV.  We talked about his sleep study results.  He was still adjusting to treatment.  He was motivated to continue with it.  He was advised to follow-up routinely in 6 months.  Today, 05/02/2022: I reviewed his ASV compliance data for the past month, he used his machine infrequently, 7 out of 30 days with an 03/26/2022 and 04/24/2022 with percent use days greater than 4 hours at 6 days.  Residual AHI at goal, ASV EPAP of 7 cm, minimum pressure support 3 cm, maximum pressure support 15 cm, leak on the high side with the 95th percentile at 48.3 L/min with significant fluctuation noted.  In the past 3 months he used his machine with 31 days with a big gap between late January 2024 and early March 2024.  He had a recent pacemaker/ICD implant generator change out on 04/10/2022.  He reports recent elevated blood pressure values, he is not sure if it is from worsening allergies.  He checks his blood pressure at home and the systolic number has been in the 160s at times.  He admits that he stopped using his ASV for about a month but recently restarted it.  Sometimes he struggles with a fullface mask but generally tolerates the treatment and is willing to be consistent with ASV treatment.  We talked about the correlation between sleep apnea and  blood pressure surges or difficult to control blood pressure.  He is compliant with his blood pressure medications.  He tries to hydrate well but could do better.  He limits his caffeine and drinks very little coffee just for taste in the morning.  He has had an intermittent dull headache, nothing sustained, nothing severe, again, we talked about the correlation between sleep apnea and recurrent headaches today.  The patient's allergies, current medications, family history, past medical history, past social history, past surgical history and problem list were reviewed and updated as appropriate.    Previously:   I first met him at the request of his primary care physician on 06/06/2021, at which time he reported a prior diagnosis of sleep apnea.  He had an older CPAP machine.  [place]  He was advised to proceed with repeat sleep testing.  He had a baseline sleep study  on 06/14/2021 which showed severe primary central sleep apnea with an AHI of 50.3/h, O2 nadir 86%, almost exclusively with central respiratory events.  He was advised to return for a titration study.  He had a PAP titration on 07/04/2021 which was conducted with BiPAP therapy initially.  He was started on BiPAP of 8 over 4 cm and it was increased to 9 over 5 cm.  However, he had ongoing central sleep disordered breathing on standard BiPAP therapy and was switched to BiPAP ST, further titrated up to 11 over 7 cm.  He did not have  significant improvement on BiPAP ST and was therefore switched to ASV with an EPAP of 5 cm and further titrated to an EPAP of 7 cm.  Final settings were EPAP of 7 cm, minimum pressure support of 3 cm, maximum pressure support of 15 cm with a residual AHI of 0/h, O2 nadir 92% with nonsupine REM sleep achieved.  He was advised to start home ASV.  His set up date was 08/11/2021.  He has a ResMed air curve 10 ASV machine.   I reviewed his ASV compliance data from 09/25/2021 through 10/24/2021,  which is a total of 30 days, during which time he used his machine 28 days with percent use days greater than 4 hours at 77%, indicating good compliance with an average usage of 6 hours and 11 minutes, residual AHI at goal at 1.8/h, leak on the higher side with the 95th percentile at 18.6 L/min on a ASV of 7 cm, minimum pressure support of 3 cm, maximum pressure support of 15 cm.     06/06/21: (He) was previously diagnosed with obstructive sleep apnea and placed on PAP therapy.  Prior sleep study results are not available for my review today, study was out of state, as per his recollection, study was at least 3 years ago, when he was residing in [place].  He moved to [place] in 2020.  He does not have a current DME company.  He has not really used his CPAP consistently but admits that it was helpful and helped him feel better rested and more energized during the day.  He is motivated to get back on track with his treatment.  He does not recall if he had mild to moderate or severe sleep apnea at the time.  His weight has been more or less stable, bedtime generally between 10 and 11 PM and rise time around 7 AM.  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  He then moved to [place] and worked for the [employer].  He lives with his wife, they have 2 grown children, younger daughter lives in [place] and older daughter lives in [place], she is Clinical biochemist of operations and special events with the [employer].  [employer].  I reviewed your office note from 2/32023.  His Epworth sleepiness score is 14 out of 24, fatigue severity score is 52 out of 63.  He has nocturia about twice per average night, denies recurrent morning headaches.  He is not aware of any family history of sleep apnea.  He is a non-smoker and drinks alcohol very occasionally.  I reviewed compliance data, in the  past month he used his machine once.  He admits that he has not been using his machine and just used it here and there.  He was affected by the [employer] recall and received a replacement unit about 6 months ago as he recalls.  He does not have a TV in his bedroom, no pets in the household.  His Past Medical History Is Significant For: Past Medical History:  Diagnosis Date   Acute blood loss anemia    Allergic rhinitis    Arthritis    Asthma    Chronic anticoagulation    Constipation    Dyslipidemia    Dysrhythmia    history of paroxysmal A Fib and typical A Flutter    GERD (gastroesophageal reflux  disease)    History of bronchitis    History of sick sinus syndrome    History of syncope    HLD (hyperlipidemia)    Hx of syncope    Hypertension    Hyponatremia    Leukocytosis    Moderate persistent asthma with acute exacerbation in adult    Osteoarthritis    s/p R & L THR   Presence of permanent cardiac pacemaker    Primary osteoarthritis of right hip    Sleep apnea    pt states not currently using CPAP machine    Unsteady gait     His Past Surgical History Is Significant For: Past Surgical History:  Procedure Laterality Date   CARDIOVERSION N/A 04/28/2013   Procedure: CARDIOVERSION;  Surgeon: Dorothy Spark, MD;  Location: [facility];  Service: Cardiovascular;  Laterality: N/A;   HEMORRHOID SURGERY     left shoulder surgery      secondary to torn ligament / subluxation   PPM GENERATOR CHANGEOUT N/A 04/10/2022   Procedure: PPM GENERATOR CHANGEOUT;  Surgeon: Evans Lance, MD;  Location: [facility];  Service: Cardiovascular;  Laterality: N/A;   TEE WITHOUT CARDIOVERSION N/A 04/28/2013   Procedure: TRANSESOPHAGEAL ECHOCARDIOGRAM (TEE);  Surgeon: Dorothy Spark, MD;  Location: [facility];  Service: Cardiovascular;  Laterality: N/A;   TOTAL HIP ARTHROPLASTY Right 03/08/2015   Procedure: RIGHT TOTAL HIP ARTHROPLASTY ANTERIOR APPROACH;  Surgeon: Gaynelle Arabian, MD;  Location: [facility];  Service: Orthopedics;  Laterality: Right;   TOTAL HIP ARTHROPLASTY Left 01/07/2020   Procedure: TOTAL HIP ARTHROPLASTY ANTERIOR APPROACH;  Surgeon: Gaynelle Arabian, MD;  Location: [facility];  Service: Orthopedics;  Laterality: Left;  115min    His Family History Is Significant For: Family History  Problem Relation Age of Onset   Hypertension Father    Allergic rhinitis Neg Hx    Asthma Neg Hx    Eczema Neg Hx    Immunodeficiency Neg Hx    Urticaria Neg Hx    Sleep apnea Neg Hx     His Social History Is Significant For: Social History   Socioeconomic History   Marital status: Married    Spouse name: Not on file   Number of children: Not on file   Years of education: Not on file   Highest education level: Not on file  Occupational History   Not on file  Tobacco Use   Smoking status: Never   Smokeless tobacco: Never  Vaping Use   Vaping Use: Never used  Substance and Sexual Activity   Alcohol use: Yes    Alcohol/week: 0.0 standard drinks of alcohol    Comment: socially    Drug use: No   Sexual activity: Not on file  Other Topics Concern   Not on file  Social History Narrative   ** Merged History Encounter **       Married, 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 Insecurity: Not on file  Transportation Needs: Not on file  Physical Activity: Not on file  Stress: Not on file  Social Connections: Not on file    His Allergies Are:  Allergies  Allergen Reactions   Penicillins Rash    Has patient had a PCN reaction causing immediate rash, facial/tongue/throat swelling, SOB or lightheadedness with hypotension: Yes Has patient had a PCN reaction causing severe rash involving mucus membranes or skin necrosis: Yes Has patient  had a PCN reaction that required hospitalization: No Has patient had a PCN reaction occurring within the last 10 years: No If all  of the above answers are "NO", then may proceed with Cephalosporin use. Rash at the injection site  :   His Current Medications Are:  Outpatient Encounter Medications as of 05/02/2022  Medication Sig   albuterol (VENTOLIN HFA) 108 (90 Base) MCG/ACT inhaler Inhale 2 puffs into the lungs every 4 (four) hours as needed for wheezing or shortness of breath.   apixaban (ELIQUIS) 5 MG TABS tablet Take 1 tablet (5 mg total) by mouth 2 (two) times daily.   atorvastatin (LIPITOR) 20 MG tablet Take 20 mg by mouth at bedtime.    Azilsartan-Chlorthalidone (EDARBYCLOR) 40-12.5 MG TABS Take 1 tablet by mouth every evening.   Budeson-Glycopyrrol-Formoterol (BREZTRI AEROSPHERE) 160-9-4.8 MCG/ACT AERO Inhale 2 puffs into the lungs in the morning and at bedtime.   famotidine (PEPCID) 40 MG tablet Take 1 tablet (40 mg total) by mouth daily.   fluticasone (FLONASE) 50 MCG/ACT nasal spray Place 1 spray into both nostrils 2 (two) times daily as needed for allergies or rhinitis. (Patient taking differently: Place 1 spray into both nostrils daily.)   levocetirizine (XYZAL) 5 MG tablet Take 1 tablet (5 mg total) by mouth daily as needed (Can take an extra dose during flare ups.). (Patient taking differently: Take 5 mg by mouth daily.)   montelukast (SINGULAIR) 10 MG tablet Take 1 tablet (10 mg total) by mouth at bedtime.   Multiple Vitamins-Minerals (MEGA MULTI MEN) TABS Take 1 tablet by mouth daily as needed.   olopatadine (PATANOL) 0.1 % ophthalmic solution Place 1 drop into both eyes daily.   Spacer/Aero-Holding Chambers DEVI 1 Device by Does not apply route as directed.   triamcinolone cream (KENALOG) 0.1 % Apply 1 Application topically 2 (two) times daily as needed (itching).   [DISCONTINUED] chlorpheniramine ([employer] ALLERGY RELIEF) 4 MG tablet Take 4 mg by mouth daily.   [DISCONTINUED] budesonide-formoterol (SYMBICORT) 160-4.5 MCG/ACT inhaler Inhale 2 puffs into the lungs 2 (two) times daily. (Patient not taking:  Reported on 04/05/2022)   [DISCONTINUED] ciclesonide (ALVESCO) 160 MCG/ACT inhaler USE 1 INHALATION ORALLY TWICE DAILY TO PREVENT COUGH OR WHEEZE. INCREASE TO USE 2 INHALATIONS ORALLY TWICE DAILY WITH ASTHMA FLARE. RINSE, GARGLE AND SPIT AFTER USE. (Patient not taking: Reported on 02/28/2022)   [DISCONTINUED] dextromethorphan-guaiFENesin (MUCINEX DM) 30-600 MG 12hr tablet Take 1 tablet by mouth 2 (two) times daily. (Patient taking differently: Take 1 tablet by mouth 2 (two) times daily as needed for cough.)   Facility-Administered Encounter Medications as of 05/02/2022  Medication   tezepelumab-ekko (TEZSPIRE) 210 MG/1.91ML syringe 210 mg  :  Review of Systems:  Out of a complete 14 point review of systems, all are reviewed and negative with the exception of these symptoms as listed below:   Review of Systems  Neurological:        Cpap f/u. Doing ok.  ESS 11.     Objective:  Neurological Exam  Physical Exam Physical Examination:   Vitals:   05/02/22 0802 05/02/22 0826  BP: (!) 161/98 (!) 148/90  Pulse: 79 74    General Examination: The patient is a very pleasant 88 y.o. male in no acute distress. He appears well-developed and well-nourished and well groomed.   HEENT: Normocephalic, atraumatic, pupils are equal, round and reactive to light, extraocular tracking is well-preserved.  No nystagmus. Hearing is grossly intact. Face is symmetric with normal facial animation. Speech is  clear with no dysarthria noted. There is no hypophonia. There is no lip, neck/head, jaw or voice tremor. Neck is supple with full range of passive and active motion. There are no carotid bruits on auscultation. Oropharynx exam reveals: mild mouth dryness, adequate dental hygiene and moderate airway crowding.  Tongue protrudes centrally and palate elevates symmetrically.   Chest: Clear to auscultation without wheezing, rhonchi or crackles noted.   Heart: S1+S2+0, regular and normal without murmurs, rubs or  gallops noted.    Abdomen: Soft, non-tender and non-distended.   Extremities: There is no pitting edema in the distal lower extremities bilaterally.    Skin: Warm and dry without trophic changes noted.    Musculoskeletal: exam reveals no obvious joint deformities.  He is status post bilateral hip replacements.   Neurologically:  Mental status: The patient is awake, alert and oriented in all 4 spheres. His immediate and remote memory, attention, language skills and fund of knowledge are appropriate. There is no evidence of aphasia, agnosia, apraxia or anomia. Speech is clear with normal prosody and enunciation. Thought process is linear. Mood is normal and affect is normal.  Cranial nerves II - XII are as described above under HEENT exam.  Motor exam: Normal bulk, strength and tone is noted. There is no obvious tremor. Fine motor skills and coordination: grossly intact.  Cerebellar testing: No dysmetria or intention tremor. There is no truncal or gait ataxia.  Sensory exam: intact to light touch in the upper and lower extremities.  Gait, station and balance: He stands easily. No veering to one side is noted. No leaning to one side is noted. Posture is age-appropriate and stance is narrow based. Gait shows normal stride length and normal pace. No problems turning are noted.    Assessment and Plan:  In summary, Isaiah Murphy is a very pleasant 88 year old male with an underlying complex medical history of hypertension, hyperlipidemia, paroxysmal A-fib with status post cardioversion, sick sinus syndrome, history of syncope, status post pacemaker placement, with status post generator exchange in early March 2024, asthma, allergic rhinitis, chronic constipation, reflux disease, osteoarthritis with status post bilateral hip replacements, and mildly overweight state, who presents for follow-up consultation of his central sleep apnea, on treatment with ASV.  His baseline sleep study from 06/14/2021 showed  severe primary central sleep apnea with an AHI of 50.3/h, O2 nadir 86%.  He had a titration study shortly thereafter on 07/04/2021, at which time his central sleep apnea was not controlled with standard CPAP or standard BiPAP therapy, nor with BiPAP ST.  He did reasonably well with ASV.  He has been on ASV treatment since 08/11/2021 and was quite compliant in the beginning, had some struggle with mask fit but has tried at least 2 different mask styles.  He stopped using his ASV for about a month and just recently in the past couple of weeks restarted using it.  He is made aware of the correlation between surging blood pressure and recurrent headaches and untreated sleep apnea.  He is encouraged to be consistent with ASV treatment.  When he is on it, his sleep apnea is under good control.  Leak has been fluctuating, he is encouraged to talk to his DME provider about a potential mask change again.  He is advised to follow-up in this clinic to see one of our nurse practitioners routinely in 6 months, sooner if needed.  He is agreeable to using his ASV, he is encouraged to monitor blood pressure in the  next few days at home and contact his PCP if it continues to stay elevated. We reviewed his compliance data in detail today.  I answered all his questions today and he was in agreement with our plan.   I spent 40 minutes in total face-to-face time and in reviewing records during pre-charting, more than 50% of which was spent in counseling and coordination of care, reviewing test results, reviewing medications and treatment regimen and/or in discussing or reviewing the diagnosis of primary central sleep apnea, ASV treatment, OSA, elevated blood pressure reading, the prognosis and treatment options. Pertinent laboratory and imaging test results that were available during this visit with the patient were reviewed by me and considered in my medical decision making (see chart for details).

## 2022-05-04 ENCOUNTER — Ambulatory Visit (INDEPENDENT_AMBULATORY_CARE_PROVIDER_SITE_OTHER): Payer: Medicare Other

## 2022-05-04 DIAGNOSIS — J4550 Severe persistent asthma, uncomplicated: Secondary | ICD-10-CM

## 2022-06-01 ENCOUNTER — Ambulatory Visit (INDEPENDENT_AMBULATORY_CARE_PROVIDER_SITE_OTHER): Payer: Medicare Other

## 2022-06-01 DIAGNOSIS — J4540 Moderate persistent asthma, uncomplicated: Secondary | ICD-10-CM

## 2022-06-29 ENCOUNTER — Ambulatory Visit (INDEPENDENT_AMBULATORY_CARE_PROVIDER_SITE_OTHER): Payer: Medicare Other

## 2022-06-29 DIAGNOSIS — J4550 Severe persistent asthma, uncomplicated: Secondary | ICD-10-CM

## 2022-07-11 ENCOUNTER — Ambulatory Visit (INDEPENDENT_AMBULATORY_CARE_PROVIDER_SITE_OTHER): Payer: Medicare Other

## 2022-07-11 DIAGNOSIS — I442 Atrioventricular block, complete: Secondary | ICD-10-CM | POA: Diagnosis not present

## 2022-07-11 LAB — CUP PACEART REMOTE DEVICE CHECK
Battery Remaining Longevity: 150 mo
Battery Voltage: 3.18 V
Brady Statistic RA Percent Paced: 0 %
Brady Statistic RV Percent Paced: 99.89 %
Date Time Interrogation Session: 20240604112138
Implantable Lead Connection Status: 753985
Implantable Lead Connection Status: 753985
Implantable Lead Implant Date: 20150413
Implantable Lead Implant Date: 20150413
Implantable Lead Location: 753859
Implantable Lead Location: 753860
Implantable Lead Model: 5076
Implantable Lead Model: 5076
Implantable Pulse Generator Implant Date: 20240304
Lead Channel Impedance Value: 342 Ohm
Lead Channel Impedance Value: 380 Ohm
Lead Channel Impedance Value: 399 Ohm
Lead Channel Impedance Value: 494 Ohm
Lead Channel Pacing Threshold Amplitude: 0.875 V
Lead Channel Pacing Threshold Pulse Width: 0.4 ms
Lead Channel Sensing Intrinsic Amplitude: 0.75 mV
Lead Channel Sensing Intrinsic Amplitude: 0.75 mV
Lead Channel Sensing Intrinsic Amplitude: 5.875 mV
Lead Channel Sensing Intrinsic Amplitude: 5.875 mV
Lead Channel Setting Pacing Amplitude: 2 V
Lead Channel Setting Pacing Pulse Width: 0.4 ms
Lead Channel Setting Sensing Sensitivity: 2 mV
Zone Setting Status: 755011

## 2022-07-18 ENCOUNTER — Ambulatory Visit: Payer: Medicare Other | Attending: Internal Medicine | Admitting: Internal Medicine

## 2022-07-18 ENCOUNTER — Encounter: Payer: Self-pay | Admitting: Internal Medicine

## 2022-07-18 VITALS — BP 98/62 | HR 83 | Ht 69.0 in | Wt 181.0 lb

## 2022-07-18 DIAGNOSIS — Z950 Presence of cardiac pacemaker: Secondary | ICD-10-CM | POA: Diagnosis present

## 2022-07-18 DIAGNOSIS — I10 Essential (primary) hypertension: Secondary | ICD-10-CM | POA: Diagnosis present

## 2022-07-18 DIAGNOSIS — I495 Sick sinus syndrome: Secondary | ICD-10-CM | POA: Insufficient documentation

## 2022-07-18 LAB — CUP PACEART INCLINIC DEVICE CHECK
Battery Remaining Longevity: 151 mo
Battery Voltage: 3.17 V
Brady Statistic RA Percent Paced: 0 %
Brady Statistic RV Percent Paced: 99.89 %
Date Time Interrogation Session: 20240611162136
Implantable Lead Connection Status: 753985
Implantable Lead Connection Status: 753985
Implantable Lead Implant Date: 20150413
Implantable Lead Implant Date: 20150413
Implantable Lead Location: 753859
Implantable Lead Location: 753860
Implantable Lead Model: 5076
Implantable Lead Model: 5076
Implantable Pulse Generator Implant Date: 20240304
Lead Channel Impedance Value: 323 Ohm
Lead Channel Impedance Value: 399 Ohm
Lead Channel Impedance Value: 399 Ohm
Lead Channel Impedance Value: 513 Ohm
Lead Channel Pacing Threshold Amplitude: 1 V
Lead Channel Pacing Threshold Pulse Width: 0.4 ms
Lead Channel Sensing Intrinsic Amplitude: 0.875 mV
Lead Channel Sensing Intrinsic Amplitude: 2.75 mV
Lead Channel Sensing Intrinsic Amplitude: 5.875 mV
Lead Channel Sensing Intrinsic Amplitude: 5.875 mV
Lead Channel Setting Pacing Amplitude: 2 V
Lead Channel Setting Pacing Pulse Width: 0.4 ms
Lead Channel Setting Sensing Sensitivity: 2 mV
Zone Setting Status: 755011

## 2022-07-18 NOTE — Patient Instructions (Signed)
Medication Instructions:  Your physician recommends that you continue on your current medications as directed. Please refer to the Current Medication list given to you today.  *If you need a refill on your cardiac medications before your next appointment, please call your pharmacy*  Lab Work: None ordered.  If you have labs (blood work) drawn today and your tests are completely normal, you will receive your results only by: MyChart Message (if you have MyChart) OR A paper copy in the mail If you have any lab test that is abnormal or we need to change your treatment, we will call you to review the results.  Testing/Procedures: None ordered.  Follow-Up: At [facility], you and your health needs are our priority.  As part of our continuing mission to provide you with exceptional heart care, we have created designated Provider Care Teams.  These Care Teams include your primary Cardiologist (physician) and Advanced Practice Providers (APPs -  Physician Assistants and Nurse Practitioners) who all work together to provide you with the care you need, when you need it.   Your next appointment:   1 year(s)  The format for your next appointment:   In Person  Provider:   Gregg Taylor, MD{or one of the following Advanced Practice Providers on your designated Care Team:   Renee Ursuy, PA-C Michael "Andy" Tillery, PA-C  Remote monitoring is used to monitor your Pacemaker from home. This monitoring reduces the number of office visits required to check your device to one time per year. It allows us to keep an eye on the functioning of your device to ensure it is working properly. You are scheduled for a device check from home on 9/3. You may send your transmission at any time that day. If you have a wireless device, the transmission will be sent automatically. After your physician reviews your transmission, you will receive a postcard with your next transmission date.       

## 2022-07-18 NOTE — Progress Notes (Signed)
HPI Isaiah Murphy returns today for ongoing evaluation and management of his PPM. He is a pleasant 88 yo man with a h/o HTN, sinus node dysfunction and CHB, who is s/p PPM insertion. He has moved back to [place] from [place]. He has PAF. he does not have palpitations. He underwent PM gen change out 3 months ago. He has done well in the interim.  Allergies  Allergen Reactions   Penicillins Rash    Has patient had a PCN reaction causing immediate rash, facial/tongue/throat swelling, SOB or lightheadedness with hypotension: Yes Has patient had a PCN reaction causing severe rash involving mucus membranes or skin necrosis: Yes Has patient had a PCN reaction that required hospitalization: No Has patient had a PCN reaction occurring within the last 10 years: No If all of the above answers are "NO", then may proceed with Cephalosporin use. Rash at the injection site     Current Outpatient Medications  Medication Sig Dispense Refill   albuterol (VENTOLIN HFA) 108 (90 Base) MCG/ACT inhaler Inhale 2 puffs into the lungs every 4 (four) hours as needed for wheezing or shortness of breath. 54 g 1   apixaban (ELIQUIS) 5 MG TABS tablet Take 1 tablet (5 mg total) by mouth 2 (two) times daily. 90 tablet 1   atorvastatin (LIPITOR) 20 MG tablet Take 20 mg by mouth at bedtime.      Azilsartan-Chlorthalidone (EDARBYCLOR) 40-12.5 MG TABS Take 1 tablet by mouth every evening.     Budeson-Glycopyrrol-Formoterol (BREZTRI AEROSPHERE) 160-9-4.8 MCG/ACT AERO Inhale 2 puffs into the lungs in the morning and at bedtime. 76 g 1   famotidine (PEPCID) 40 MG tablet Take 1 tablet (40 mg total) by mouth daily. 90 tablet 1   fluticasone (FLONASE) 50 MCG/ACT nasal spray Place 1 spray into both nostrils 2 (two) times daily as needed for allergies or rhinitis. (Patient taking differently: Place 1 spray into both nostrils daily.) 48 g 1   levocetirizine (XYZAL) 5 MG tablet Take 1 tablet (5 mg total) by mouth daily as needed  (Can take an extra dose during flare ups.). (Patient taking differently: Take 5 mg by mouth daily.) 180 tablet 1   montelukast (SINGULAIR) 10 MG tablet Take 1 tablet (10 mg total) by mouth at bedtime. 90 tablet 1   Multiple Vitamins-Minerals (MEGA MULTI MEN) TABS Take 1 tablet by mouth daily as needed.     olopatadine (PATANOL) 0.1 % ophthalmic solution Place 1 drop into both eyes daily. 15 mL 1   Spacer/Aero-Holding Chambers DEVI 1 Device by Does not apply route as directed. 1 each 2   triamcinolone cream (KENALOG) 0.1 % Apply 1 Application topically 2 (two) times daily as needed (itching). 454 g 1   Current Facility-Administered Medications  Medication Dose Route Frequency Provider Last Rate Last Admin   tezepelumab-ekko (TEZSPIRE) 210 MG/1. syringe 210 mg  210 mg Subcutaneous Q28 days Jessica Priest, MD   210 mg at 06/29/22 9147     Past Medical History:  Diagnosis Date   Acute blood loss anemia    Allergic rhinitis    Arthritis    Asthma    Chronic anticoagulation    Constipation    Dyslipidemia    Dysrhythmia    history of paroxysmal A Fib and typical A Flutter    GERD (gastroesophageal reflux disease)    History of bronchitis    History of sick sinus syndrome    History of syncope    HLD (hyperlipidemia)  Hx of syncope    Hypertension    Hyponatremia    Leukocytosis    Moderate persistent asthma with acute exacerbation in adult    Osteoarthritis    s/p R & L THR   Presence of permanent cardiac pacemaker    Primary osteoarthritis of right hip    Sleep apnea    pt states not currently using CPAP machine    Unsteady gait     ROS:   All systems reviewed and negative except as noted in the HPI.   Past Surgical History:  Procedure Laterality Date   CARDIOVERSION N/A 04/28/2013   Procedure: CARDIOVERSION;  Surgeon: Lars Masson, MD;  Location: [facility] ENDOSCOPY;  Service: Cardiovascular;  Laterality: N/A;   HEMORRHOID SURGERY     left shoulder surgery       secondary to torn ligament / subluxation   PPM GENERATOR CHANGEOUT N/A 04/10/2022   Procedure: PPM GENERATOR CHANGEOUT;  Surgeon: Marinus Maw, MD;  Location: [facility] INVASIVE CV LAB;  Service: Cardiovascular;  Laterality: N/A;   TEE WITHOUT CARDIOVERSION N/A 04/28/2013   Procedure: TRANSESOPHAGEAL ECHOCARDIOGRAM (TEE);  Surgeon: Lars Masson, MD;  Location: [facility] ENDOSCOPY;  Service: Cardiovascular;  Laterality: N/A;   TOTAL HIP ARTHROPLASTY Right 03/08/2015   Procedure: RIGHT TOTAL HIP ARTHROPLASTY ANTERIOR APPROACH;  Surgeon: Ollen Gross, MD;  Location: [facility] ORS;  Service: Orthopedics;  Laterality: Right;   TOTAL HIP ARTHROPLASTY Left 01/07/2020   Procedure: TOTAL HIP ARTHROPLASTY ANTERIOR APPROACH;  Surgeon: Ollen Gross, MD;  Location: [facility] ORS;  Service: Orthopedics;  Laterality: Left;      Family History  Problem Relation Age of Onset   Hypertension Father    Allergic rhinitis Neg Hx    Asthma Neg Hx    Eczema Neg Hx    Immunodeficiency Neg Hx    Urticaria Neg Hx    Sleep apnea Neg Hx      Social History   Socioeconomic History   Marital status: Married    Spouse name: Not on file   Number of children: Not on file   Years of education: Not on file   Highest education level: Not on file  Occupational History   Not on file  Tobacco Use   Smoking status: Never   Smokeless tobacco: Never  Vaping Use   Vaping Use: Never used  Substance and Sexual Activity   Alcohol use: Yes    Alcohol/week: 0.0 standard drinks of alcohol    Comment: socially    Drug use: No   Sexual activity: Not on file  Other Topics Concern   Not on file  Social History Narrative   ** Merged History Encounter **       Married, Scientist, research (medical) in [place]   Social Determinants of Corporate investment banker Strain: Not on Ship broker Insecurity: Not on file  Transportation Needs: Not on file  Physical Activity: Not on file  Stress: Not on file  Social Connections: Not  on file  Intimate Partner Violence: Not on file     BP 98/62   Pulse 83   Ht 5\' 9"  (1.753 m)   Wt 181 lb (82.1 kg)   SpO2 99%   BMI 26.73 kg/m   Physical Exam:  Well appearing NAD HEENT: Unremarkable Neck:  No JVD, no thyromegally Lymphatics:  No adenopathy Back:  No CVA tenderness Lungs:  Clear HEART:  Regular rate rhythm, no murmurs, no rubs, no clicks Abd:  soft,  positive bowel sounds, no organomegally, no rebound, no guarding Ext:  2 plus pulses, no edema, no cyanosis, no clubbing Skin:  No rashes no nodules Neuro:  CN II through XII intact, motor grossly intact  EKG - NSR with ventricular pacing  DEVICE  Normal device function.  See PaceArt for details.   Assess/Plan:  Tachybrady syndrome - he is maintaining NSR. He is asymptomatic. 2. CHB - he is asymptomatic. He has no escape today. 3. PPM - his medtronic DDD PM is working normally. He is s/p PM gen change out and doing well. 4. HTN - his sbp is controlled. No change in his meds today. He will maintain a low sodium diet.   Sharlot Gowda Kristina Bertone,MD

## 2022-07-27 ENCOUNTER — Ambulatory Visit (INDEPENDENT_AMBULATORY_CARE_PROVIDER_SITE_OTHER): Payer: Medicare Other

## 2022-07-27 DIAGNOSIS — J4550 Severe persistent asthma, uncomplicated: Secondary | ICD-10-CM

## 2022-08-03 NOTE — Progress Notes (Signed)
Remote pacemaker transmission.   

## 2022-08-24 ENCOUNTER — Ambulatory Visit (INDEPENDENT_AMBULATORY_CARE_PROVIDER_SITE_OTHER): Payer: Medicare Other

## 2022-08-24 DIAGNOSIS — J4550 Severe persistent asthma, uncomplicated: Secondary | ICD-10-CM

## 2022-08-29 ENCOUNTER — Ambulatory Visit (INDEPENDENT_AMBULATORY_CARE_PROVIDER_SITE_OTHER): Payer: Medicare Other | Admitting: Allergy and Immunology

## 2022-08-29 VITALS — BP 142/82 | HR 92 | Temp 97.3°F | Resp 16 | Ht 69.0 in | Wt 186.5 lb

## 2022-08-29 DIAGNOSIS — K219 Gastro-esophageal reflux disease without esophagitis: Secondary | ICD-10-CM

## 2022-08-29 DIAGNOSIS — J3089 Other allergic rhinitis: Secondary | ICD-10-CM | POA: Diagnosis not present

## 2022-08-29 DIAGNOSIS — J4550 Severe persistent asthma, uncomplicated: Secondary | ICD-10-CM | POA: Diagnosis not present

## 2022-08-29 MED ORDER — FAMOTIDINE 40 MG PO TABS: 40.0000 mg | ORAL_TABLET | Freq: Every evening | ORAL | 1 refills | Status: DC

## 2022-08-29 MED ORDER — ALBUTEROL SULFATE HFA 108 (90 BASE) MCG/ACT IN AERS: 2.0000 | INHALATION_SPRAY | RESPIRATORY_TRACT | 0 refills | Status: DC | PRN

## 2022-08-29 MED ORDER — ESOMEPRAZOLE MAGNESIUM 40 MG PO CPDR: 40.0000 mg | DELAYED_RELEASE_CAPSULE | Freq: Two times a day (BID) | ORAL | 1 refills | Status: DC

## 2022-08-29 MED ORDER — BREZTRI AEROSPHERE 160-9-4.8 MCG/ACT IN AERO: 2.0000 | INHALATION_SPRAY | Freq: Two times a day (BID) | RESPIRATORY_TRACT | 1 refills | Status: DC

## 2022-08-29 MED ORDER — MONTELUKAST SODIUM 10 MG PO TABS: 10.0000 mg | ORAL_TABLET | Freq: Every evening | ORAL | 1 refills | Status: DC

## 2022-08-29 MED ORDER — FLUTICASONE PROPIONATE 50 MCG/ACT NA SUSP: 1.0000 | Freq: Two times a day (BID) | NASAL | 1 refills | Status: DC | PRN

## 2022-08-29 NOTE — Patient Instructions (Signed)
  1.  Treat and prevent inflammation of airway:   A. Breztri - 2 inhalations 2 times per day w/ spacer  B. Flonase - 1 spray each nostril 1-2  times per day C. Montelukast 10 mg - 1 tablet 1 time per day D. Tezepelumab injections every 4 weeks.  2.  Treat and prevent reflux (check medications):  A.  Esomeprazole 40 mg - 1 tablet 1-2 times per day B.  Famotidine 40 mg - 1 tablet in evening   3. If needed:  A. Albuterol HFA  - 2 inhalations every 4-6 hours  4. Return to clinic in 6 months or earlier if problem  5. Plan for fall flu vaccine  6. Have you has shingles vaccines???

## 2022-08-29 NOTE — Progress Notes (Unsigned)
[place] - [place] - [place] - [place] - [place]   Follow-up Note  Referring Provider: Creola Corn, MD Primary Provider: Creola Corn, MD Date of Office Visit: 08/29/2022  Subjective:   Isaiah Murphy (DOB: December 14, 1934) is a 88 y.o. male who [facility] in re-evaluation of the following:  HPI: Isaiah Murphy to this clinic in evaluation of his asthma and allergic rhinitis and reflux.  I last saw him in this clinic 28 February 2022.  He has had an excellent interval of time without the need for systemic steroid or antibiotic for any type of airway issue.  He rarely uses the short acting bronchodilator.  He has started going out on a tennis court over the course of the past 2 weeks.  He continues on his anti-TSL P antibody injection and Breztri on a consistent basis.  He had very little problem with his upper airway while using Flonase and montelukast.  He had very little problems with his reflux.  He is not entirely sure what medicine he is using for his reflux at this point.  He had some type of neuropathy affecting his left temporal and parietal and occipital skull a few weeks ago that required him to go to see his primary care doctor.  He never had any type of cutaneous eruption and fortunately over the course of the past week this has waned in intensity and now he has just some very very slight tenderness to touch in that area.  Allergies as of 08/29/2022       Reactions   Penicillins Rash   Has patient had a PCN reaction causing immediate rash, facial/tongue/throat swelling, SOB or lightheadedness with hypotension: Yes Has patient had a PCN reaction causing severe rash involving mucus membranes or skin necrosis: Yes Has patient had a PCN reaction that required hospitalization: No Has patient had a PCN reaction occurring within the last 10 years: No If all of the above answers are "NO", then may proceed with Cephalosporin use. Rash at  the injection site        Medication List    albuterol 108 (90 Base) MCG/ACT inhaler Commonly known as: VENTOLIN HFA Inhale 2 puffs into the lungs every 4 (four) hours as needed for wheezing or shortness of breath.   apixaban 5 MG Tabs tablet Commonly known as: Eliquis Take 1 tablet (5 mg total) by mouth 2 (two) times daily.   atorvastatin 20 MG tablet Commonly known as: LIPITOR Take 20 mg by mouth at bedtime.   Breztri Aerosphere 160-9-4.8 MCG/ACT Aero Generic drug: Budeson-Glycopyrrol-Formoterol Inhale 2 puffs into the lungs in the morning and at bedtime.   Edarbyclor 40-12.5 MG Tabs Generic drug: Azilsartan-Chlorthalidone Take 1 tablet by mouth every evening.   famotidine 40 MG tablet Commonly known as: PEPCID Take 1 tablet (40 mg total) by mouth daily.   fluticasone 50 MCG/ACT nasal spray Commonly known as: FLONASE Place 1 spray into both nostrils 2 (two) times daily as needed for allergies or rhinitis. What changed: when to take this   levocetirizine 5 MG tablet Commonly known as: XYZAL Take 1 tablet (5 mg total) by mouth daily as needed (Can take an extra dose during flare ups.).   Mega Multi Men Tabs Take 1 tablet by mouth daily as needed.   montelukast 10 MG tablet Commonly known as: SINGULAIR Take 1 tablet (10 mg total) by mouth at bedtime.   olopatadine 0.1 % ophthalmic solution Commonly known as:  PATANOL Place 1 drop into both eyes daily.   Spacer/Aero-Holding [employer] 1 Device by Does not apply route as directed.   triamcinolone cream 0.1 % Commonly known as: KENALOG Apply 1 Application topically 2 (two) times daily as needed (itching).    Past Medical History:  Diagnosis Date   Acute blood loss anemia    Allergic rhinitis    Arthritis    Asthma    Chronic anticoagulation    Constipation    Dyslipidemia    Dysrhythmia    history of paroxysmal A Fib and typical A Flutter    GERD (gastroesophageal reflux disease)    History of  bronchitis    History of sick sinus syndrome    History of syncope    HLD (hyperlipidemia)    Hx of syncope    Hypertension    Hyponatremia    Leukocytosis    Moderate persistent asthma with acute exacerbation in adult    Osteoarthritis    s/p R & L THR   Presence of permanent cardiac pacemaker    Primary osteoarthritis of right hip    Sleep apnea    pt states not currently using CPAP machine    Unsteady gait     Past Surgical History:  Procedure Laterality Date   CARDIOVERSION N/A 04/28/2013   Procedure: CARDIOVERSION;  Surgeon: Isaiah Masson, MD;  Location: [facility] ENDOSCOPY;  Service: Cardiovascular;  Laterality: N/A;   HEMORRHOID SURGERY     left shoulder surgery      secondary to torn ligament / subluxation   PPM GENERATOR CHANGEOUT N/A 04/10/2022   Procedure: PPM GENERATOR CHANGEOUT;  Surgeon: Isaiah Maw, MD;  Location: [facility] INVASIVE CV LAB;  Service: Cardiovascular;  Laterality: N/A;   TEE WITHOUT CARDIOVERSION N/A 04/28/2013   Procedure: TRANSESOPHAGEAL ECHOCARDIOGRAM (TEE);  Surgeon: Isaiah Masson, MD;  Location: [facility] ENDOSCOPY;  Service: Cardiovascular;  Laterality: N/A;   TOTAL HIP ARTHROPLASTY Right 03/08/2015   Procedure: RIGHT TOTAL HIP ARTHROPLASTY ANTERIOR APPROACH;  Surgeon: Isaiah Gross, MD;  Location: [facility] ORS;  Service: Orthopedics;  Laterality: Right;   TOTAL HIP ARTHROPLASTY Left 01/07/2020   Procedure: TOTAL HIP ARTHROPLASTY ANTERIOR APPROACH;  Surgeon: Isaiah Gross, MD;  Location: [facility] ORS;  Service: Orthopedics;  Laterality: Left;     Review of systems negative except as noted in HPI / PMHx or noted below:  Review of Systems  Constitutional: Negative.   HENT: Negative.    Eyes: Negative.   Respiratory: Negative.    Cardiovascular: Negative.   Gastrointestinal: Negative.   Genitourinary: Negative.   Musculoskeletal: Negative.   Skin: Negative.   Neurological: Negative.   Endo/Heme/Allergies: Negative.   Psychiatric/Behavioral: Negative.        Objective:   Vitals:   08/29/22 1109  BP: (!) 142/82  Pulse: 92  Resp: 16  Temp: (!) 97.3 F (36.3 C)  SpO2: 96%   Height: 5\' 9"  (175.3 cm)  Weight: 186 lb 8 oz (84.6 kg)   Physical Exam Constitutional:      Appearance: He is not diaphoretic.  HENT:     Head: Normocephalic.     Right Ear: Tympanic membrane, ear canal and external ear normal.     Left Ear: Tympanic membrane, ear canal and external ear normal.     Nose: Nose normal. No mucosal edema or rhinorrhea.     Mouth/Throat:     Pharynx: Uvula midline. No oropharyngeal exudate.  Eyes:     Conjunctiva/sclera: Conjunctivae normal.  Neck:  Thyroid: No thyromegaly.     Trachea: Trachea normal. No tracheal tenderness or tracheal deviation.  Cardiovascular:     Rate and Rhythm: Normal rate and regular rhythm.     Heart sounds: Normal heart sounds, S1 normal and S2 normal. No murmur heard. Pulmonary:     Effort: No respiratory distress.     Breath sounds: Normal breath sounds. No stridor. No wheezing or rales.  Lymphadenopathy:     Head:     Right side of head: No tonsillar adenopathy.     Left side of head: No tonsillar adenopathy.     Cervical: No cervical adenopathy.  Skin:    Findings: No erythema or rash.     Nails: There is no clubbing.  Neurological:     Mental Status: He is alert.     Diagnostics: Spirometry was performed and demonstrated an FEV1 of 1.57 at 70 % of predicted.  Assessment and Plan:   1. Asthma, severe persistent, well-controlled   2. Perennial allergic rhinitis   3. Gastroesophageal reflux disease, unspecified whether esophagitis present    1.  Treat and prevent inflammation of airway:   A. Breztri - 2 inhalations 2 times per day w/ spacer  B. Flonase - 1 spray each nostril 1-2  times per day C. Montelukast 10 mg - 1 tablet 1 time per day D. Tezepelumab injections every 4 weeks.  2.  Treat and prevent reflux (check medications):  A.  Esomeprazole 40 mg - 1 tablet 1-2  times per day B.  Famotidine 40 mg - 1 tablet in evening   3. If needed:  A. Albuterol HFA  - 2 inhalations every 4-6 hours  4. Return to clinic in 6 months or earlier if problem  5. Plan for fall flu vaccine  6. Have you has shingles vaccines???  Isaiah Murphy is doing quite well regarding his respiratory tract and he will continue on anti-inflammatory agents for his airway including use of anti-TSL P antibody every 4 weeks.  His reflux is also under good control and he can work through exactly what medications he is taking with his primary care doctor as today he is not very sure exactly what he is using to treat this condition.  It does sound as though he had a recent nerve irritation affecting his left parietal and temporal and occipital region and fortunately this is much improved.  I would not doubt that this was varicella even though there was no skin eruption and I encouraged him to obtain the shingles vaccine if he has not obtained that vaccine to date.  If he does well we will see him back in this clinic in 6 months or earlier if there is a problem.   Isaiah Schimke, MD Allergy / Immunology [facility]

## 2022-08-30 ENCOUNTER — Encounter: Payer: Self-pay | Admitting: Allergy and Immunology

## 2022-08-30 ENCOUNTER — Other Ambulatory Visit: Payer: Self-pay | Admitting: Internal Medicine

## 2022-08-30 DIAGNOSIS — G509 Disorder of trigeminal nerve, unspecified: Secondary | ICD-10-CM

## 2022-08-31 NOTE — Addendum Note (Signed)
Addended by: Dollene Cleveland R on: 08/31/2022 10:29 AM   Modules accepted: Orders

## 2022-09-06 ENCOUNTER — Other Ambulatory Visit (HOSPITAL_COMMUNITY): Payer: Self-pay

## 2022-09-07 ENCOUNTER — Other Ambulatory Visit (HOSPITAL_COMMUNITY): Payer: Self-pay

## 2022-09-12 ENCOUNTER — Other Ambulatory Visit (HOSPITAL_COMMUNITY): Payer: Self-pay

## 2022-09-21 ENCOUNTER — Ambulatory Visit (INDEPENDENT_AMBULATORY_CARE_PROVIDER_SITE_OTHER): Payer: Medicare Other | Admitting: *Deleted

## 2022-09-21 DIAGNOSIS — J4550 Severe persistent asthma, uncomplicated: Secondary | ICD-10-CM | POA: Diagnosis not present

## 2022-10-10 ENCOUNTER — Ambulatory Visit (INDEPENDENT_AMBULATORY_CARE_PROVIDER_SITE_OTHER): Payer: Medicare Other

## 2022-10-10 DIAGNOSIS — I495 Sick sinus syndrome: Secondary | ICD-10-CM

## 2022-10-10 DIAGNOSIS — I442 Atrioventricular block, complete: Secondary | ICD-10-CM

## 2022-10-10 LAB — CUP PACEART REMOTE DEVICE CHECK
Battery Remaining Longevity: 147 mo
Battery Voltage: 3.13 V
Brady Statistic RA Percent Paced: 0 %
Brady Statistic RV Percent Paced: 99.31 %
Date Time Interrogation Session: 20240903095045
Implantable Lead Connection Status: 753985
Implantable Lead Connection Status: 753985
Implantable Lead Implant Date: 20150413
Implantable Lead Implant Date: 20150413
Implantable Lead Location: 753859
Implantable Lead Location: 753860
Implantable Lead Model: 5076
Implantable Lead Model: 5076
Implantable Pulse Generator Implant Date: 20240304
Lead Channel Impedance Value: 342 Ohm
Lead Channel Impedance Value: 361 Ohm
Lead Channel Impedance Value: 399 Ohm
Lead Channel Impedance Value: 494 Ohm
Lead Channel Pacing Threshold Amplitude: 0.875 V
Lead Channel Pacing Threshold Pulse Width: 0.4 ms
Lead Channel Sensing Intrinsic Amplitude: 0.75 mV
Lead Channel Sensing Intrinsic Amplitude: 0.75 mV
Lead Channel Sensing Intrinsic Amplitude: 5.875 mV
Lead Channel Sensing Intrinsic Amplitude: 5.875 mV
Lead Channel Setting Pacing Amplitude: 2 V
Lead Channel Setting Pacing Pulse Width: 0.4 ms
Lead Channel Setting Sensing Sensitivity: 2 mV
Zone Setting Status: 755011

## 2022-10-18 NOTE — Progress Notes (Signed)
Remote pacemaker transmission.   

## 2022-10-19 ENCOUNTER — Ambulatory Visit (INDEPENDENT_AMBULATORY_CARE_PROVIDER_SITE_OTHER): Payer: Medicare Other

## 2022-10-19 DIAGNOSIS — J4550 Severe persistent asthma, uncomplicated: Secondary | ICD-10-CM | POA: Diagnosis not present

## 2022-10-19 DIAGNOSIS — J4540 Moderate persistent asthma, uncomplicated: Secondary | ICD-10-CM

## 2022-10-31 ENCOUNTER — Ambulatory Visit: Payer: Medicare Other | Admitting: Allergy and Immunology

## 2022-10-31 ENCOUNTER — Other Ambulatory Visit: Payer: Self-pay

## 2022-10-31 ENCOUNTER — Encounter: Payer: Self-pay | Admitting: Allergy and Immunology

## 2022-10-31 VITALS — BP 140/82 | HR 88 | Temp 98.1°F | Resp 17

## 2022-10-31 DIAGNOSIS — K219 Gastro-esophageal reflux disease without esophagitis: Secondary | ICD-10-CM | POA: Diagnosis not present

## 2022-10-31 DIAGNOSIS — J4550 Severe persistent asthma, uncomplicated: Secondary | ICD-10-CM | POA: Diagnosis not present

## 2022-10-31 DIAGNOSIS — J3089 Other allergic rhinitis: Secondary | ICD-10-CM

## 2022-10-31 MED ORDER — AIRSUPRA 90-80 MCG/ACT IN AERO: 2.0000 | INHALATION_SPRAY | RESPIRATORY_TRACT | 1 refills | Status: DC | PRN

## 2022-10-31 MED ORDER — PREDNISONE 1 MG PO TABS: 30.0000 mg | ORAL_TABLET | Freq: Every day | ORAL | Status: DC

## 2022-10-31 MED ORDER — PREDNISONE 1 MG PO TABS
30.0000 mg | ORAL_TABLET | Freq: Once | ORAL | Status: AC
  Administered 2022-10-31: 30 mg via ORAL

## 2022-10-31 NOTE — Patient Instructions (Addendum)
  1.  Treat and prevent inflammation of airway:   A. Breztri - 2 inhalations 2 times per day w/ spacer  B. Flonase - 1 spray each nostril 1-2  times per day C. Montelukast 10 mg - 1 tablet 1 time per day D. Tezepelumab injections every 4 weeks. E. Prednisone 30 mg delivered in clinic today as single dose  2.  Treat and prevent reflux:  A.  Esomeprazole 40 mg - 1 tablet 1-2 times per day B.  Famotidine 40 mg - 1 tablet in evening   3. If needed:  A. AIRSUPRA  - 2 inhalations every 4-6 hours (replaces albuterol)  4. Return to clinic in 6 months or earlier if problem  5. Plan for fall flu vaccine

## 2022-10-31 NOTE — Progress Notes (Unsigned)
[place] - [place] - [place] - [place] - [place]   Follow-up Note  Referring Provider: Creola Corn, MD Primary Provider: Creola Corn, MD Date of Office Visit: 10/31/2022  Subjective:   Isaiah Murphy (DOB: [birth year]) is a 88 y.o. male who [facility] in re-evaluation of the following:  HPI: Isaiah Murphy presents to this clinic in evaluation of asthma and allergic rhinitis and reflux.  I last saw him in this clinic 29 August 2022.  He is here today with a history of 3 days of cough.  This has been a very deep cough that is disturbing his sleep.  He has no other associated respiratory tract or systemic or constitutional symptoms.  There is not an obvious provoking factor giving rise to this issue.  Fortunately, today he has not coughed at all and he feels "100%".  He continues on a collection of anti-inflammatory agents for his asthma and allergic rhinitis including use of anti-TSLP antibody.  He believes that his airway issue is under excellent control on this plan.  His reflux has been under excellent control as well.  Allergies as of 10/31/2022       Reactions   Penicillins Rash   Has patient had a PCN reaction causing immediate rash, facial/tongue/throat swelling, SOB or lightheadedness with hypotension: Yes Has patient had a PCN reaction causing severe rash involving mucus membranes or skin necrosis: Yes Has patient had a PCN reaction that required hospitalization: No Has patient had a PCN reaction occurring within the last 10 years: No If all of the above answers are "NO", then may proceed with Cephalosporin use. Rash at the injection site        Medication List    albuterol 108 (90 Base) MCG/ACT inhaler Commonly known as: VENTOLIN HFA Inhale 2 puffs into the lungs every 4 (four) hours as needed for wheezing or shortness of breath.   apixaban 5 MG Tabs tablet Commonly known as: Eliquis Take 1 tablet (5 mg total) by  mouth 2 (two) times daily.   atorvastatin 20 MG tablet Commonly known as: LIPITOR Take 20 mg by mouth at bedtime.   Breztri Aerosphere 160-9-4.8 MCG/ACT Aero Generic drug: Budeson-Glycopyrrol-Formoterol Inhale 2 puffs into the lungs in the morning and at bedtime.   Edarbyclor 40-12.5 MG Tabs Generic drug: Azilsartan-Chlorthalidone Take 1 tablet by mouth every evening.   esomeprazole 40 MG capsule Commonly known as: NexIUM Take 1 capsule (40 mg total) by mouth in the morning and at bedtime.   famotidine 40 MG tablet Commonly known as: PEPCID Take 1 tablet (40 mg total) by mouth at bedtime.   fluticasone 50 MCG/ACT nasal spray Commonly known as: FLONASE Place 1 spray into both nostrils 2 (two) times daily as needed for allergies or rhinitis.   levocetirizine 5 MG tablet Commonly known as: XYZAL Take 1 tablet (5 mg total) by mouth daily as needed (Can take an extra dose during flare ups.).   Mega Multi Men Tabs Take 1 tablet by mouth daily as needed.   montelukast 10 MG tablet Commonly known as: SINGULAIR Take 1 tablet (10 mg total) by mouth at bedtime.   olopatadine 0.1 % ophthalmic solution Commonly known as: PATANOL Place 1 drop into both eyes daily.   Spacer/Aero-Holding [employer] 1 Device by Does not apply route as directed.   triamcinolone cream 0.1 % Commonly known as: KENALOG Apply 1 Application topically 2 (two) times daily as needed (itching).  Past Medical History:  Diagnosis Date   Acute blood loss anemia    Allergic rhinitis    Arthritis    Asthma    Chronic anticoagulation    Constipation    Dyslipidemia    Dysrhythmia    history of paroxysmal A Fib and typical A Flutter    GERD (gastroesophageal reflux disease)    History of bronchitis    History of sick sinus syndrome    History of syncope    HLD (hyperlipidemia)    Hx of syncope    Hypertension    Hyponatremia    Leukocytosis    Moderate persistent asthma with acute exacerbation  in adult    Osteoarthritis    s/p R & L THR   Presence of permanent cardiac pacemaker    Primary osteoarthritis of right hip    Sleep apnea    pt states not currently using CPAP machine    Unsteady gait     Past Surgical History:  Procedure Laterality Date   CARDIOVERSION N/A 04/28/2013   Procedure: CARDIOVERSION;  Surgeon: Lars Masson, MD;  Location: [facility] ENDOSCOPY;  Service: Cardiovascular;  Laterality: N/A;   HEMORRHOID SURGERY     left shoulder surgery      secondary to torn ligament / subluxation   PPM GENERATOR CHANGEOUT N/A 04/10/2022   Procedure: PPM GENERATOR CHANGEOUT;  Surgeon: Marinus Maw, MD;  Location: [facility] INVASIVE CV LAB;  Service: Cardiovascular;  Laterality: N/A;   TEE WITHOUT CARDIOVERSION N/A 04/28/2013   Procedure: TRANSESOPHAGEAL ECHOCARDIOGRAM (TEE);  Surgeon: Lars Masson, MD;  Location: [facility] ENDOSCOPY;  Service: Cardiovascular;  Laterality: N/A;   TOTAL HIP ARTHROPLASTY Right 03/08/2015   Procedure: RIGHT TOTAL HIP ARTHROPLASTY ANTERIOR APPROACH;  Surgeon: Ollen Gross, MD;  Location: [facility] ORS;  Service: Orthopedics;  Laterality: Right;   TOTAL HIP ARTHROPLASTY Left 01/07/2020   Procedure: TOTAL HIP ARTHROPLASTY ANTERIOR APPROACH;  Surgeon: Ollen Gross, MD;  Location: [facility] ORS;  Service: Orthopedics;  Laterality: Left;     Review of systems negative except as noted in HPI / PMHx or noted below:  Review of Systems  Constitutional: Negative.   HENT: Negative.    Eyes: Negative.   Respiratory: Negative.    Cardiovascular: Negative.   Gastrointestinal: Negative.   Genitourinary: Negative.   Musculoskeletal: Negative.   Skin: Negative.   Neurological: Negative.   Endo/Heme/Allergies: Negative.   Psychiatric/Behavioral: Negative.       Objective:   Vitals:   10/31/22 0928  BP: (!) 142/88  Pulse: 88  Resp: 17  Temp: 98.1 F (36.7 C)  SpO2: 98%          Physical Exam Constitutional:      Appearance: He is not diaphoretic.  HENT:      Head: Normocephalic.     Right Ear: Tympanic membrane, ear canal and external ear normal.     Left Ear: Tympanic membrane, ear canal and external ear normal.     Nose: Nose normal. No mucosal edema or rhinorrhea.     Mouth/Throat:     Pharynx: Uvula midline. No oropharyngeal exudate.  Eyes:     Conjunctiva/sclera: Conjunctivae normal.  Neck:     Thyroid: No thyromegaly.     Trachea: Trachea normal. No tracheal tenderness or tracheal deviation.  Cardiovascular:     Rate and Rhythm: Normal rate and regular rhythm.     Heart sounds: Normal heart sounds, S1 normal and S2 normal. No murmur heard. Pulmonary:  Effort: No respiratory distress.     Breath sounds: Normal breath sounds. No stridor. No wheezing or rales.  Lymphadenopathy:     Head:     Right side of head: No tonsillar adenopathy.     Left side of head: No tonsillar adenopathy.     Cervical: No cervical adenopathy.  Skin:    Findings: No erythema or rash.     Nails: There is no clubbing.  Neurological:     Mental Status: He is alert.     Diagnostics:    Spirometry was performed and demonstrated an FEV1 of 1.80 at 82 % of predicted.  The patient had an Asthma Control Test with the following results: ACT Total Score: 20.    Assessment and Plan:   1. Asthma, severe persistent, well-controlled   2. Perennial allergic rhinitis   3. Gastroesophageal reflux disease, unspecified whether esophagitis present    1.  Treat and prevent inflammation of airway:   A. Breztri - 2 inhalations 2 times per day w/ spacer  B. Flonase - 1 spray each nostril 1-2  times per day C. Montelukast 10 mg - 1 tablet 1 time per day D. Tezepelumab injections every 4 weeks. E. Prednisone 30 mg delivered in clinic today as single dose  2.  Treat and prevent reflux:  A.  Esomeprazole 40 mg - 1 tablet 1-2 times per day B.  Famotidine 40 mg - 1 tablet in evening   3. If needed:  A. AIRSUPRA  - 2 inhalations every 4-6 hours (replaces  albuterol)  4. Return to clinic in 6 months or earlier if problem  5. Plan for fall flu vaccine  Isaiah Murphy probably had some form of allergenic or viral trigger that gave rise to his cough but fortunately this appears to have resolved and I am going to give him 1 dose of prednisone today to help with any lingering inflammation and he will continue on his collection of anti-inflammatory agents for his airway including anti-TSLP antibody and therapy directed against reflux.  Laurette Schimke, MD Allergy / Immunology [facility]

## 2022-11-01 ENCOUNTER — Encounter: Payer: Self-pay | Admitting: Allergy and Immunology

## 2022-11-09 ENCOUNTER — Ambulatory Visit: Payer: Federal, State, Local not specified - PPO | Admitting: Adult Health

## 2022-11-11 ENCOUNTER — Ambulatory Visit (INDEPENDENT_AMBULATORY_CARE_PROVIDER_SITE_OTHER): Payer: Medicare Other

## 2022-11-11 ENCOUNTER — Encounter (HOSPITAL_COMMUNITY): Payer: Self-pay

## 2022-11-11 ENCOUNTER — Ambulatory Visit (HOSPITAL_COMMUNITY)
Admission: EM | Admit: 2022-11-11 | Discharge: 2022-11-11 | Disposition: A | Discharge status: Home / Self Care | Payer: Medicare Other | Attending: Family Medicine | Admitting: Family Medicine

## 2022-11-11 DIAGNOSIS — R062 Wheezing: Secondary | ICD-10-CM

## 2022-11-11 DIAGNOSIS — R051 Acute cough: Secondary | ICD-10-CM

## 2022-11-11 MED ORDER — PREDNISONE 20 MG PO TABS: 40.0000 mg | ORAL_TABLET | Freq: Every day | ORAL | 0 refills | Status: DC

## 2022-11-11 NOTE — ED Triage Notes (Signed)
Patient states he has had a non productive cough x 2 weeks.   Patient states that he has been taking prescribed Beztri, flonase, and Singulair.

## 2022-11-15 NOTE — ED Provider Notes (Signed)
[facility]   [id number] 11/11/22 Arrival Time: 1519  ASSESSMENT & PLAN:  1. Acute cough   2. Wheezing    I have personally viewed and independently interpreted the imaging studies ordered this visit. CXR: mild cardiomegaly; no acute changes; no PNA  Without resp distress but is wheezing. Trial of: Meds ordered this encounter  Medications   predniSONE (DELTASONE) 20 MG tablet    Sig: Take 2 tablets (40 mg total) by mouth daily.    Dispense:  10 tablet    Refill:  0     Follow-up Information     Schedule an appointment as soon as possible for a visit  with Creola Corn, MD.   Specialty: Internal Medicine Why: For follow up. Contact information: [address] [place] [address] [phone]         [facility].   Specialty: Urgent Care Why: As needed. Contact information: [address] [place] [address] [phone]                Reviewed expectations re: course of current medical issues. Questions answered. Outlined signs and symptoms indicating need for more acute intervention. Understanding verbalized. After Visit Summary given.   SUBJECTIVE: History from: Patient. Isaiah Murphy is a 88 y.o. male. Reports: Patient states he has had a non productive cough x 2 weeks. Patient states that he has been taking prescribed Beztri, flonase, and Singulair. Denies: fever and headache. Denies CP. Mild SOB at times but feels this is from wheezing; worse at night. Normal PO intake without n/v/d.  OBJECTIVE:  T 98.9 P82 R14 SpO2 95%  General appearance: alert; no distress Eyes: PERRLA; EOMI; conjunctiva normal HENT: [place]; AT; with nasal congestion Neck: supple  Lungs: speaks full sentences without difficulty; unlabored; bilat exp wheezing Extremities: no edema Skin: warm and dry Neurologic: normal gait Psychological: alert and cooperative; normal mood and affect   DG Chest 2 View  Result  Date: 11/11/2022 CLINICAL DATA:  Cough for 2 weeks. EXAM: CHEST - 2 VIEW COMPARISON:  06/15/2017 FINDINGS: Dual lead left-sided pacemaker in place. Borderline cardiomegaly. Trace fluid in the fissures without large subpulmonic effusion. No confluent airspace disease. No pulmonary edema. No pneumothorax. Limited assessment, no acute osseous findings. IMPRESSION: Borderline cardiomegaly. Trace fluid in the fissures. Electronically Signed   By: Narda Rutherford M.D.   On: 11/11/2022 18:07    Allergies  Allergen Reactions   Penicillins Rash    Has patient had a PCN reaction causing immediate rash, facial/tongue/throat swelling, SOB or lightheadedness with hypotension: Yes Has patient had a PCN reaction causing severe rash involving mucus membranes or skin necrosis: Yes Has patient had a PCN reaction that required hospitalization: No Has patient had a PCN reaction occurring within the last 10 years: No If all of the above answers are "NO", then may proceed with Cephalosporin use. Rash at the injection site    Past Medical History:  Diagnosis Date   Acute blood loss anemia    Allergic rhinitis    Arthritis    Asthma    Chronic anticoagulation    Constipation    Dyslipidemia    Dysrhythmia    history of paroxysmal A Fib and typical A Flutter    GERD (gastroesophageal reflux disease)    History of bronchitis    History of sick sinus syndrome    History of syncope    HLD (hyperlipidemia)    Hx of syncope    Hypertension  Hyponatremia    Leukocytosis    Moderate persistent asthma with acute exacerbation in adult    Osteoarthritis    s/p R & L THR   Presence of permanent cardiac pacemaker    Primary osteoarthritis of right hip    Sleep apnea    pt states not currently using CPAP machine    Unsteady gait    Social History   Socioeconomic History   Marital status: Married    Spouse name: Not on file   Number of children: Not on file   Years of education: Not on file   Highest  education level: Not on file  Occupational History   Not on file  Tobacco Use   Smoking status: Never   Smokeless tobacco: Never  Vaping Use   Vaping status: Never Used  Substance and Sexual Activity   Alcohol use: Yes    Alcohol/week: 0.0 standard drinks of alcohol    Comment: socially    Drug use: No   Sexual activity: Not on file  Other Topics Concern   Not on file  Social History Narrative   ** Merged History Encounter **       Married, Scientist, research (medical) in [place]   Social Determinants of Health   Financial Resource Strain: Not on file  Food Insecurity: Not on file  Transportation Needs: Not on file  Physical Activity: Not on file  Stress: Not on file  Social Connections: Not on file  Intimate Partner Violence: Not on file   Family History  Problem Relation Age of Onset   Hypertension Father    Allergic rhinitis Neg Hx    Asthma Neg Hx    Eczema Neg Hx    Immunodeficiency Neg Hx    Urticaria Neg Hx    Sleep apnea Neg Hx    Past Surgical History:  Procedure Laterality Date   CARDIOVERSION N/A 04/28/2013   Procedure: CARDIOVERSION;  Surgeon: Lars Masson, MD;  Location: [facility] ENDOSCOPY;  Service: Cardiovascular;  Laterality: N/A;   HEMORRHOID SURGERY     left shoulder surgery      secondary to torn ligament / subluxation   PPM GENERATOR CHANGEOUT N/A 04/10/2022   Procedure: PPM GENERATOR CHANGEOUT;  Surgeon: Marinus Maw, MD;  Location: [facility] INVASIVE CV LAB;  Service: Cardiovascular;  Laterality: N/A;   TEE WITHOUT CARDIOVERSION N/A 04/28/2013   Procedure: TRANSESOPHAGEAL ECHOCARDIOGRAM (TEE);  Surgeon: Lars Masson, MD;  Location[facility] ENDOSCOPY;  Service: Cardiovascular;  Laterality: N/A;   TOTAL HIP ARTHROPLASTY Right 03/08/2015   Procedure: RIGHT TOTAL HIP ARTHROPLASTY ANTERIOR APPROACH;  Surgeon: Ollen Gross, MD;  Location: [facility] ORS;  Service: Orthopedics;  Laterality: Right;   TOTAL HIP ARTHROPLASTY Left 01/07/2020   Procedure: TOTAL  HIP ARTHROPLASTY ANTERIOR APPROACH;  Surgeon: Ollen Gross, MD;  Location: [facility] ORS;  Service: Orthopedics;  Laterality: Left;      Mardella Layman, MD 11/15/22 [phone]

## 2022-11-16 ENCOUNTER — Ambulatory Visit: Payer: Medicare Other

## 2022-11-16 DIAGNOSIS — J4550 Severe persistent asthma, uncomplicated: Secondary | ICD-10-CM

## 2022-11-16 DIAGNOSIS — J4540 Moderate persistent asthma, uncomplicated: Secondary | ICD-10-CM

## 2022-11-27 NOTE — Patient Instructions (Incomplete)
  1.  Treat and prevent inflammation of airway:   A. Breztri - 2 inhalations 2 times per day w/ spacer . Make sure and take this every day. Rinse mouth out afterwards B. Flonase - 1 spray each nostril 1-2  times per day C. Montelukast 10 mg - 1 tablet 1 time per day D. Tezepelumab injections every 4 weeks. E. Prednisone 10 mg taking 2 tablets twice a day for 3 days, then on the fourth day take 2 tablets in the morning, on the fifth day take 1 tablet and stop.  Gust the side effects of prednisone such as osteoporosis, avascular necrosis, and cataracts,, etc.   2.  Treat and prevent reflux:  A.  Esomeprazole 40 mg - 1 tablet 1-2 times per day B.  Famotidine 40 mg - 1 tablet in evening   3. If needed:  A. AIRSUPRA  - 2 inhalations every 4-6 hours (replaces albuterol)  4. Return to clinic on 03/06/2023 at 10:50 AM with Dr. Lucie Leather or earlier if problem  5. Plan for fall flu vaccine  Your blood pressure was elevated at today's office visit. Please schedule an appointment with your primary care physician to discuss  Bring a list of your medications with you at your next office visit so we can know what you are taking for your reflux.

## 2022-11-28 ENCOUNTER — Other Ambulatory Visit: Payer: Self-pay

## 2022-11-28 ENCOUNTER — Encounter: Payer: Self-pay | Admitting: Family

## 2022-11-28 ENCOUNTER — Ambulatory Visit (INDEPENDENT_AMBULATORY_CARE_PROVIDER_SITE_OTHER): Payer: Medicare Other | Admitting: Family

## 2022-11-28 VITALS — BP 140/90 | HR 92 | Temp 97.9°F | Resp 12 | Wt 182.9 lb

## 2022-11-28 DIAGNOSIS — K219 Gastro-esophageal reflux disease without esophagitis: Secondary | ICD-10-CM | POA: Diagnosis not present

## 2022-11-28 DIAGNOSIS — J3089 Other allergic rhinitis: Secondary | ICD-10-CM

## 2022-11-28 DIAGNOSIS — J4551 Severe persistent asthma with (acute) exacerbation: Secondary | ICD-10-CM

## 2022-11-28 DIAGNOSIS — R051 Acute cough: Secondary | ICD-10-CM | POA: Diagnosis not present

## 2022-11-28 MED ORDER — PREDNISONE 10 MG PO TABS: ORAL_TABLET | ORAL | 0 refills | Status: DC

## 2022-11-28 NOTE — Progress Notes (Signed)
[address] [place] [address] Dept: [phone]  FOLLOW UP NOTE  Patient ID: Isaiah Murphy, male    DOB: [birth year]  Age: 88 y.o. MRN: [id number] Date of Office Visit: 11/28/2022  Assessment  Chief Complaint: Cough  HPI Isaiah Murphy is an 88 year old male who presents today for an acute visit of cough.  He was last seen on October 31, 2022 by Dr. Lucie Leather for well-controlled severe persistent asthma, perennial allergic rhinitis, and gastroesophageal reflux disease.  He denies any new diagnosis or surgery since his last office visit.  Asthma: He reports his cough started at least  3 weeks ago.  He has been on 2 short rounds of prednisone that he reports have helped, but the cough is not gone.  He was given a single dose of prednisone 30 mg on September 24 by our office and then received steroids on October 5 from the emergency room due to his cough.  Chest x-ray from November 11, 2022 shows: "Borderline cardiomegaly.  Trace fluid in the fissures."  He has a conference this Friday in [place] and would like to be able to speak without having any issues.  He reports his cough is mostly dry, but there have been times where the cough is productive.  It is now clear what he is coughing up, but early on there was a little bit of light yellow.  He also reports some wheezing and a little bit of shortness of breath.  The cough does wake him up at night.  He denies tightness in chest, fever, chills, body aches, and sick contacts.  He reports that he feels good and plans on playing tennis tomorrow.  He reports that he got off of rhythm with using his inhalers and not taking them twice a day for a few days and feels like this is what has caused his symptoms.  He is supposed to be taking Breztri 2 puffs twice a day with a spacer, montelukast 10 mg once a day, and Airsupra as needed.  He does mention that Paulene Floor has helped within 4 to5 minutes of using with his cough.  He denies any problems or  reactions with his Tezspire injections.    Perennial allergic rhinitis: He reports postnasal drip that is described as sparingly.  He denies rhinorrhea and nasal congestion.  He continues to take Flonase as needed, Singulair 10 mg once daily, and saline rinses.  Not been treated for any sinus infections since we last saw him.  Gastroesophageal reflux disease: He denies heartburn or reflux symptoms.  He reports that he is taking the medicine as prescribed. He has esomeprazole,but has been out for the past 3 to 4 days. He is going to contact his pharmacy. There is some uncertainty as to whether he is taking famotidine 40 mg at night.  He reports that he does take a "little brown pill in the evening."  When asked where he feels like his cough is coming from he points to his chest.   Drug Allergies:  Allergies  Allergen Reactions   Penicillins Rash    Has patient had a PCN reaction causing immediate rash, facial/tongue/throat swelling, SOB or lightheadedness with hypotension: Yes Has patient had a PCN reaction causing severe rash involving mucus membranes or skin necrosis: Yes Has patient had a PCN reaction that required hospitalization: No Has patient had a PCN reaction occurring within the last 10 years: No If all of the above answers are "NO", then may proceed  with Cephalosporin use. Rash at the injection site    Review of Systems: Negative except as per HPI   Physical Exam: BP (!) 140/90   Pulse 92   Temp 97.9 F (36.6 C) (Temporal)   Resp 12   Wt 182 lb 14.4 oz (83 kg)   SpO2 98%   BMI 27.01 kg/m    Physical Exam Constitutional:      Appearance: Normal appearance.  HENT:     Head: Normocephalic and atraumatic.     Comments: Pharynx normal, eyes normal, ears normal, nose normal    Right Ear: Tympanic membrane, ear canal and external ear normal.     Left Ear: Tympanic membrane, ear canal and external ear normal.     Nose: Nose normal.     Mouth/Throat:     Mouth: Mucous  membranes are moist.     Pharynx: Oropharynx is clear.  Eyes:     Conjunctiva/sclera: Conjunctivae normal.  Cardiovascular:     Rate and Rhythm: Regular rhythm.     Heart sounds: Normal heart sounds.  Pulmonary:     Effort: Pulmonary effort is normal.     Breath sounds: Normal breath sounds.     Comments: Lungs clear to auscultation Musculoskeletal:     Cervical back: Neck supple.  Skin:    General: Skin is warm.  Neurological:     Mental Status: He is alert and oriented to person, place, and time.  Psychiatric:        Mood and Affect: Mood normal.        Behavior: Behavior normal.        Thought Content: Thought content normal.        Judgment: Judgment normal.     Diagnostics: FVC 2.75 L (91%), FEV1 1.84 L (82%), FEV1/FVC 0.67.  Spirometry indicates normal spirometry.  Assessment and Plan: 1. Severe persistent asthma with (acute) exacerbation   2. Acute cough   3. Perennial allergic rhinitis   4. Gastroesophageal reflux disease, unspecified whether esophagitis present     Meds ordered this encounter  Medications   predniSONE (DELTASONE) 10 MG tablet    Sig: Take 2 tablets twice a day for 3 days, then on the fourth day take 2 tablets in the morning, and on the fifth day take 1 tablet and stop    Dispense:  15 tablet    Refill:  0    Patient Instructions   1.  Treat and prevent inflammation of airway:   A. Breztri - 2 inhalations 2 times per day w/ spacer . Make sure and take this every day. Rinse mouth out afterwards B. Flonase - 1 spray each nostril 1-2  times per day C. Montelukast 10 mg - 1 tablet 1 time per day D. Tezepelumab injections every 4 weeks. E. Prednisone 10 mg taking 2 tablets twice a day for 3 days, then on the fourth day take 2 tablets in the morning, on the fifth day take 1 tablet and stop.  Gust the side effects of prednisone such as osteoporosis, avascular necrosis, and cataracts,, etc.   2.  Treat and prevent reflux:  A.  Esomeprazole 40  mg - 1 tablet 1-2 times per day B.  Famotidine 40 mg - 1 tablet in evening   3. If needed:  A. AIRSUPRA  - 2 inhalations every 4-6 hours (replaces albuterol)  4. Return to clinic on 03/06/2023 at 10:50 AM with Dr. Lucie Leather or earlier if problem  5. Plan for fall flu vaccine  Your blood pressure was elevated at today's office visit. Please schedule an appointment with your primary care physician to discuss  Bring a list of your medications with you at your next office visit so we can know what you are taking for your reflux.  Return in about 14 weeks (around 03/06/2023), or if symptoms worsen or fail to improve.    Thank you for the opportunity to care for this patient.  Please do not hesitate to contact me with questions.  Nehemiah Settle, FNP [facility]

## 2022-12-09 ENCOUNTER — Other Ambulatory Visit: Payer: Self-pay | Admitting: Internal Medicine

## 2022-12-09 DIAGNOSIS — I484 Atypical atrial flutter: Secondary | ICD-10-CM

## 2022-12-11 NOTE — Telephone Encounter (Signed)
Prescription refill request for Eliquis received. Indication:aflutter Last office visit:6/24 Scr:1.27  2/24 Age: 88 Weight:83  kg  Prescription refilled

## 2022-12-12 ENCOUNTER — Other Ambulatory Visit: Payer: Self-pay | Admitting: Internal Medicine

## 2022-12-12 DIAGNOSIS — I484 Atypical atrial flutter: Secondary | ICD-10-CM

## 2022-12-13 ENCOUNTER — Other Ambulatory Visit: Payer: Self-pay

## 2022-12-13 MED ORDER — MONTELUKAST SODIUM 10 MG PO TABS: 10.0000 mg | ORAL_TABLET | Freq: Every evening | ORAL | 1 refills | Status: DC

## 2022-12-14 ENCOUNTER — Ambulatory Visit (INDEPENDENT_AMBULATORY_CARE_PROVIDER_SITE_OTHER): Payer: Medicare Other

## 2022-12-14 DIAGNOSIS — J4550 Severe persistent asthma, uncomplicated: Secondary | ICD-10-CM | POA: Diagnosis not present

## 2023-01-03 ENCOUNTER — Telehealth: Payer: Self-pay | Admitting: Allergy and Immunology

## 2023-01-03 ENCOUNTER — Other Ambulatory Visit: Payer: Self-pay | Admitting: *Deleted

## 2023-01-03 MED ORDER — FAMOTIDINE 40 MG PO TABS: 40.0000 mg | ORAL_TABLET | Freq: Every evening | ORAL | 1 refills | Status: DC

## 2023-01-03 NOTE — Telephone Encounter (Signed)
Refills have been sent in to the requested pharmacy for 90 day supply. Called patient and informed, patient verbalized understanding. Can confirm the correct number is [phone].

## 2023-01-03 NOTE — Telephone Encounter (Signed)
Patient called and stated that he needs a refill for a 90 day supply of Famotidine. Patients pharmacy is [facility]. Patients call back number is [phone] is what he gave me but according to chart it is [phone].

## 2023-01-09 ENCOUNTER — Ambulatory Visit (INDEPENDENT_AMBULATORY_CARE_PROVIDER_SITE_OTHER): Payer: Medicare Other

## 2023-01-09 DIAGNOSIS — I442 Atrioventricular block, complete: Secondary | ICD-10-CM

## 2023-01-09 LAB — CUP PACEART REMOTE DEVICE CHECK
Battery Remaining Longevity: 144 mo
Battery Voltage: 3.07 V
Brady Statistic RA Percent Paced: 0 %
Brady Statistic RV Percent Paced: 97.98 %
Date Time Interrogation Session: 20241203090028
Implantable Lead Connection Status: 753985
Implantable Lead Connection Status: 753985
Implantable Lead Implant Date: 20150413
Implantable Lead Implant Date: 20150413
Implantable Lead Location: 753859
Implantable Lead Location: 753860
Implantable Lead Model: 5076
Implantable Lead Model: 5076
Implantable Pulse Generator Implant Date: 20240304
Lead Channel Impedance Value: 342 Ohm
Lead Channel Impedance Value: 342 Ohm
Lead Channel Impedance Value: 418 Ohm
Lead Channel Impedance Value: 475 Ohm
Lead Channel Pacing Threshold Amplitude: 0.875 V
Lead Channel Pacing Threshold Pulse Width: 0.4 ms
Lead Channel Sensing Intrinsic Amplitude: 1.25 mV
Lead Channel Sensing Intrinsic Amplitude: 1.25 mV
Lead Channel Sensing Intrinsic Amplitude: 19.875 mV
Lead Channel Sensing Intrinsic Amplitude: 19.875 mV
Lead Channel Setting Pacing Amplitude: 2 V
Lead Channel Setting Pacing Pulse Width: 0.4 ms
Lead Channel Setting Sensing Sensitivity: 2 mV
Zone Setting Status: 755011

## 2023-01-11 ENCOUNTER — Ambulatory Visit: Payer: Medicare Other

## 2023-01-17 ENCOUNTER — Ambulatory Visit (INDEPENDENT_AMBULATORY_CARE_PROVIDER_SITE_OTHER): Payer: Medicare Other

## 2023-01-17 DIAGNOSIS — J4550 Severe persistent asthma, uncomplicated: Secondary | ICD-10-CM

## 2023-02-14 ENCOUNTER — Ambulatory Visit: Payer: Medicare Other | Admitting: *Deleted

## 2023-02-14 DIAGNOSIS — J4550 Severe persistent asthma, uncomplicated: Secondary | ICD-10-CM

## 2023-03-06 ENCOUNTER — Encounter: Payer: Self-pay | Admitting: Allergy and Immunology

## 2023-03-06 ENCOUNTER — Other Ambulatory Visit: Payer: Self-pay

## 2023-03-06 ENCOUNTER — Ambulatory Visit (INDEPENDENT_AMBULATORY_CARE_PROVIDER_SITE_OTHER): Payer: Medicare Other | Admitting: Allergy and Immunology

## 2023-03-06 VITALS — BP 140/86 | HR 76 | Temp 97.8°F | Resp 16 | Ht 69.0 in | Wt 176.3 lb

## 2023-03-06 DIAGNOSIS — J3089 Other allergic rhinitis: Secondary | ICD-10-CM

## 2023-03-06 DIAGNOSIS — R972 Elevated prostate specific antigen [PSA]: Secondary | ICD-10-CM | POA: Insufficient documentation

## 2023-03-06 DIAGNOSIS — K219 Gastro-esophageal reflux disease without esophagitis: Secondary | ICD-10-CM | POA: Insufficient documentation

## 2023-03-06 DIAGNOSIS — I700 Atherosclerosis of aorta: Secondary | ICD-10-CM | POA: Insufficient documentation

## 2023-03-06 DIAGNOSIS — K5900 Constipation, unspecified: Secondary | ICD-10-CM | POA: Insufficient documentation

## 2023-03-06 DIAGNOSIS — R7401 Elevation of levels of liver transaminase levels: Secondary | ICD-10-CM | POA: Insufficient documentation

## 2023-03-06 DIAGNOSIS — J4550 Severe persistent asthma, uncomplicated: Secondary | ICD-10-CM

## 2023-03-06 MED ORDER — MONTELUKAST SODIUM 10 MG PO TABS: 10.0000 mg | ORAL_TABLET | Freq: Every evening | ORAL | 1 refills | Status: DC

## 2023-03-06 MED ORDER — BREZTRI AEROSPHERE 160-9-4.8 MCG/ACT IN AERO: 2.0000 | INHALATION_SPRAY | Freq: Two times a day (BID) | RESPIRATORY_TRACT | 1 refills | Status: DC

## 2023-03-06 MED ORDER — AIRSUPRA 90-80 MCG/ACT IN AERO: 2.0000 | INHALATION_SPRAY | RESPIRATORY_TRACT | 1 refills | Status: DC | PRN

## 2023-03-06 MED ORDER — ESOMEPRAZOLE MAGNESIUM 40 MG PO CPDR: 40.0000 mg | DELAYED_RELEASE_CAPSULE | Freq: Two times a day (BID) | ORAL | 1 refills | Status: DC

## 2023-03-06 MED ORDER — FAMOTIDINE 40 MG PO TABS: 40.0000 mg | ORAL_TABLET | Freq: Every evening | ORAL | 1 refills | Status: DC

## 2023-03-06 MED ORDER — FLUTICASONE PROPIONATE 50 MCG/ACT NA SUSP: 1.0000 | Freq: Two times a day (BID) | NASAL | 1 refills | Status: DC | PRN

## 2023-03-06 NOTE — Progress Notes (Unsigned)
[place] - [place] - [place] - [place]   Follow-up Note  Referring Provider: Creola Corn, MD Primary Provider: Creola Corn, MD Date of Office Visit: 03/06/2023  Subjective:   Isaiah Murphy (DOB: [birth year]) is a 89 y.o. 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  I last saw him in this clinic 31 October 2022. He did visit with our nurse practitioner on 28 November 2022 with an acute illness.  He had a respiratory tract infection during his last visit with our nurse practitioner and fortunately he appears to have resolved that issue and right now he is doing just wonderful.  He is exercising twice a week playing tennis and has very little problem performing that activity and he rarely uses a short acting bronchodilator while he continues on Breztri mostly just 1 time per day along with his tezepelumab injections every 4 weeks.  And his nose is doing well with some Flonase and montelukast.  He is no longer using Nexium and is relying on the use of famotidine in the evening which is working quite well regarding his reflux.  He has lost some weight which appears to have improved his reflux.  He has obtained the flu vaccine.  Allergies as of 03/06/2023       Reactions   Penicillins Rash   Has patient had a PCN reaction causing immediate rash, facial/tongue/throat swelling, SOB or lightheadedness with hypotension: Yes Has patient had a PCN reaction causing severe rash involving mucus membranes or skin necrosis: Yes Has patient had a PCN reaction that required hospitalization: No Has patient had a PCN reaction occurring within the last 10 years: No If all of the above answers are "NO", then may proceed with Cephalosporin use. Rash at the injection site        Medication List    Airsupra 90-80 MCG/ACT  Aero Generic drug: Albuterol-Budesonide Inhale 2 puffs into the lungs every 4 (four) hours as needed.   atorvastatin 20 MG tablet Commonly known as: LIPITOR Take 20 mg by mouth at bedtime.   Breztri Aerosphere 160-9-4.8 MCG/ACT Aero Generic drug: Budeson-Glycopyrrol-Formoterol Inhale 2 puffs into the lungs in the morning and at bedtime.   Edarbyclor 40-12.5 MG Tabs Generic drug: Azilsartan-Chlorthalidone Take 1 tablet by mouth every evening.   Eliquis 5 MG Tabs tablet Generic drug: apixaban TAKE 1 TABLET BY MOUTH TWICE A DAY   esomeprazole 40 MG capsule Commonly known as: NexIUM Take 1 capsule (40 mg total) by mouth in the morning and at bedtime.   famotidine 40 MG tablet Commonly known as: PEPCID Take 1 tablet (40 mg total) by mouth at bedtime.   fluticasone 50 MCG/ACT nasal spray Commonly known as: FLONASE Place 1 spray into both nostrils 2 (two) times daily as needed for allergies or rhinitis.   levocetirizine 5 MG tablet Commonly known as: XYZAL Take 1 tablet (5 mg total) by mouth daily as needed (Can take an extra dose during flare ups.).   Mega Multi Men Tabs Take 1 tablet by mouth daily as needed.   montelukast 10 MG tablet Commonly known as: SINGULAIR Take 1 tablet (10 mg total) by mouth at bedtime.   olopatadine 0.1 % ophthalmic solution Commonly known as: PATANOL Place 1 drop into both eyes daily.   predniSONE 20 MG tablet Commonly known as: DELTASONE Take 2 tablets (  40 mg total) by mouth daily.   Spacer/Aero-Holding [employer] 1 Device by Does not apply route as directed.   triamcinolone cream 0.1 % Commonly known as: KENALOG Apply 1 Application topically 2 (two) times daily as needed (itching).    Past Medical History:  Diagnosis Date   Acute blood loss anemia    Allergic rhinitis    Arthritis    Asthma    Chronic anticoagulation    Constipation    Dyslipidemia    Dysrhythmia    history of paroxysmal A Fib and typical A Flutter     GERD (gastroesophageal reflux disease)    History of bronchitis    History of sick sinus syndrome    History of syncope    HLD (hyperlipidemia)    Hx of syncope    Hypertension    Hyponatremia    Leukocytosis    Moderate persistent asthma with acute exacerbation in adult    Osteoarthritis    s/p R & L THR   Presence of permanent cardiac pacemaker    Primary osteoarthritis of right hip    Sleep apnea    pt states not currently using CPAP machine    Unsteady gait     Past Surgical History:  Procedure Laterality Date   CARDIOVERSION N/A 04/28/2013   Procedure: CARDIOVERSION;  Surgeon: Lars Masson, MD;  Location: [facility] ENDOSCOPY;  Service: Cardiovascular;  Laterality: N/A;   HEMORRHOID SURGERY     left shoulder surgery      secondary to torn ligament / subluxation   PPM GENERATOR CHANGEOUT N/A 04/10/2022   Procedure: PPM GENERATOR CHANGEOUT;  Surgeon: Marinus Maw, MD;  Location: [facility] INVASIVE CV LAB;  Service: Cardiovascular;  Laterality: N/A;   TEE WITHOUT CARDIOVERSION N/A 04/28/2013   Procedure: TRANSESOPHAGEAL ECHOCARDIOGRAM (TEE);  Surgeon: Lars Masson, MD;  Location: [facility] ENDOSCOPY;  Service: Cardiovascular;  Laterality: N/A;   TOTAL HIP ARTHROPLASTY Right 03/08/2015   Procedure: RIGHT TOTAL HIP ARTHROPLASTY ANTERIOR APPROACH;  Surgeon: Ollen Gross, MD;  Location: [facility] ORS;  Service: Orthopedics;  Laterality: Right;   TOTAL HIP ARTHROPLASTY Left 01/07/2020   Procedure: TOTAL HIP ARTHROPLASTY ANTERIOR APPROACH;  Surgeon: Ollen Gross, MD;  Location: [facility] ORS;  Service: Orthopedics;  Laterality: Left;     Review of systems negative except as noted in HPI / PMHx or noted below:  Review of Systems  Constitutional: Negative.   HENT: Negative.    Eyes: Negative.   Respiratory: Negative.    Cardiovascular: Negative.   Gastrointestinal: Negative.   Genitourinary: Negative.   Musculoskeletal: Negative.   Skin: Negative.   Neurological: Negative.    Endo/Heme/Allergies: Negative.   Psychiatric/Behavioral: Negative.       Objective:   Vitals:   03/06/23 1054 03/06/23 1122  BP: (!) 142/86 (!) 140/86  Pulse: 76   Resp: 16   Temp: 97.8 F (36.6 C)   SpO2: 97%    Height: 5\' 9"  (175.3 cm)  Weight: 176 lb 4.8 oz (80 kg)   Physical Exam Constitutional:      Appearance: He is not diaphoretic.  HENT:     Head: Normocephalic.     Right Ear: Tympanic membrane, ear canal and external ear normal.     Left Ear: Tympanic membrane, ear canal and external ear normal.     Nose: Nose normal. No mucosal edema or rhinorrhea.     Mouth/Throat:     Pharynx: Uvula midline. No oropharyngeal exudate.  Eyes:     Conjunctiva/sclera:  Conjunctivae normal.  Neck:     Thyroid: No thyromegaly.     Trachea: Trachea normal. No tracheal tenderness or tracheal deviation.  Cardiovascular:     Rate and Rhythm: Normal rate and regular rhythm.     Heart sounds: Normal heart sounds, S1 normal and S2 normal. No murmur heard. Pulmonary:     Effort: No respiratory distress.     Breath sounds: Normal breath sounds. No stridor. No wheezing or rales.  Lymphadenopathy:     Head:     Right side of head: No tonsillar adenopathy.     Left side of head: No tonsillar adenopathy.     Cervical: No cervical adenopathy.  Skin:    Findings: No erythema or rash.     Nails: There is no clubbing.  Neurological:     Mental Status: He is alert.     Diagnostics: Spirometry was performed and demonstrated an FEV1 of 2.01 at 91 % of predicted.  Assessment and Plan:   1. Asthma, severe persistent, well-controlled   2. Perennial allergic rhinitis   3. Gastroesophageal reflux disease, unspecified whether esophagitis present    1.  Treat and prevent inflammation of airway:   A. Breztri - 2 inhalations 1-2 times per day w/ spacer  B. Flonase - 1 spray each nostril 1-2  times per day C. Montelukast 10 mg - 1 tablet 1 time per day D. Tezepelumab injections every 4  weeks.  2.  Treat and prevent reflux:  A.  Famotidine 40 mg - 1 tablet in evening   3. If needed:  A. AIRSUPRA  - 2 inhalations every 4-6 hours (replaces albuterol) B. Esomeprazole 40 mg - 1 tablet 1-2 times per day  4. Return to clinic in 6 months or earlier if problem  5. Influenza = Tamiflu. 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  Assuming he does well with this plan we will see him back in this clinic in 6 months or earlier if there is a problem.  Laurette Schimke, MD Allergy / Immunology [facility]

## 2023-03-06 NOTE — Patient Instructions (Addendum)
  1.  Treat and prevent inflammation of airway:   A. Breztri - 2 inhalations 1-2 times per day w/ spacer  B. Flonase - 1 spray each nostril 1-2  times per day C. Montelukast 10 mg - 1 tablet 1 time per day D. Tezepelumab injections every 4 weeks.  2.  Treat and prevent reflux:  A.  Famotidine 40 mg - 1 tablet in evening   3. If needed:  A. AIRSUPRA  - 2 inhalations every 4-6 hours (replaces albuterol) B. Esomeprazole 40 mg - 1 tablet 1-2 times per day  4. Return to clinic in 6 months or earlier if problem  5. Influenza = Tamiflu. Covid = Paxlovid

## 2023-03-07 ENCOUNTER — Telehealth: Payer: Self-pay

## 2023-03-07 ENCOUNTER — Encounter: Payer: Self-pay | Admitting: Allergy and Immunology

## 2023-03-07 NOTE — Telephone Encounter (Signed)
Received fax from [facility] - DOB verified - stating the following:  Esomeprazole (Nexium) 40 mg is requiring a PA:  Formulary alternatives: Lansoprazole (Prevacid) capsule Omeprazole (Prilosec) capsule Pantoprazole (Protonix) tablet  Patient has tried Pantoprazole  - 40 mg and 80 mg.  Forwarding message to provider for next step.

## 2023-03-08 MED ORDER — LANSOPRAZOLE 30 MG PO CPDR: 30.0000 mg | DELAYED_RELEASE_CAPSULE | Freq: Every day | ORAL | 1 refills | Status: DC

## 2023-03-08 NOTE — Telephone Encounter (Signed)
Per Provider:  Lansoprazole 30 mg to replace nexium        Called patient - DOB/Pharmacy verified - advised medication change for his GERD due to his insurance.  Patient verbalized understanding to all, no questions.  New prescription sent to mail service pharmacy.

## 2023-03-08 NOTE — Addendum Note (Signed)
Addended by: Areta Haber B on: 03/08/2023 04:30 PM   Modules accepted: Orders

## 2023-03-14 ENCOUNTER — Ambulatory Visit: Payer: Medicare Other

## 2023-03-22 ENCOUNTER — Encounter: Payer: Self-pay | Admitting: Podiatry

## 2023-03-22 ENCOUNTER — Ambulatory Visit (INDEPENDENT_AMBULATORY_CARE_PROVIDER_SITE_OTHER): Payer: Medicare Other | Admitting: Podiatry

## 2023-03-22 ENCOUNTER — Ambulatory Visit: Payer: Federal, State, Local not specified - PPO | Admitting: Podiatry

## 2023-03-22 ENCOUNTER — Telehealth: Payer: Self-pay | Admitting: *Deleted

## 2023-03-22 DIAGNOSIS — M79674 Pain in right toe(s): Secondary | ICD-10-CM | POA: Diagnosis not present

## 2023-03-22 DIAGNOSIS — M79675 Pain in left toe(s): Secondary | ICD-10-CM | POA: Diagnosis not present

## 2023-03-22 DIAGNOSIS — B351 Tinea unguium: Secondary | ICD-10-CM | POA: Diagnosis not present

## 2023-03-22 NOTE — Progress Notes (Signed)
This patient presents to the office with chief complaint of long thick painful  big nails.  Patient says the nails are painful walking and wearing shoes.  This patient is unable to self treat.  This patient is unable to trim his  nails since he is unable to reach his nails.  he presents to the office for preventative foot care services. He has coagulation defect due to eliquis.  General Appearance  Alert, conversant and in no acute stress.  Vascular  Dorsalis pedis and posterior tibial  pulses are palpable  bilaterally.  Capillary return is within normal limits  bilaterally. Temperature is within normal limits  bilaterally.  Neurologic  Senn-Weinstein monofilament wire test within normal limits  bilaterally. Muscle power within normal limits bilaterally.  Nails Thick disfigured discolored nails with subungual debris  hallux nails bilaterally. No evidence of bacterial infection or drainage bilaterally.  Orthopedic  No limitations of motion  feet .  No crepitus or effusions noted.  No bony pathology or digital deformities noted. HAV  B/L.  Skin  normotropic skin with no porokeratosis noted bilaterally.  No signs of infections or ulcers noted.     Onychomycosis  Nails  B/L.  Pain in right toes  Pain in left toes  Debridement of nails both feet followed trimming the nails with dremel tool.    RTC 4months.   Helane Gunther DPM

## 2023-03-22 NOTE — Telephone Encounter (Signed)
   Pre-operative Risk Assessment    Patient Name: Isaiah Murphy  DOB: 12-21-1934 MRN: [id number]   Date of last office visit: 07/18/22 DR. Ladona Ridgel Date of next office visit: NONE  Request for Surgical Clearance    Procedure:   ROOT CANAL   Date of Surgery:  Clearance 03/29/23                                Surgeon:  DR. Jolene Provost, DDS Surgeon's Group or Practice Name:  [facility] Provost, DDS, [facility] Phone number:  [phone] Fax number:  [phone]   Type of Clearance Requested:   - Medical  - Pharmacy:  Hold Apixaban (Eliquis)     Type of Anesthesia:  Local    Additional requests/questions:    [name]   03/22/2023, 5:25 PM

## 2023-03-23 DIAGNOSIS — D6869 Other thrombophilia: Secondary | ICD-10-CM | POA: Insufficient documentation

## 2023-03-23 DIAGNOSIS — Z7901 Long term (current) use of anticoagulants: Secondary | ICD-10-CM | POA: Insufficient documentation

## 2023-03-23 DIAGNOSIS — G509 Disorder of trigeminal nerve, unspecified: Secondary | ICD-10-CM | POA: Insufficient documentation

## 2023-03-23 DIAGNOSIS — I350 Nonrheumatic aortic (valve) stenosis: Secondary | ICD-10-CM | POA: Insufficient documentation

## 2023-03-23 DIAGNOSIS — R918 Other nonspecific abnormal finding of lung field: Secondary | ICD-10-CM | POA: Insufficient documentation

## 2023-03-23 DIAGNOSIS — J309 Allergic rhinitis, unspecified: Secondary | ICD-10-CM | POA: Insufficient documentation

## 2023-03-23 DIAGNOSIS — I480 Paroxysmal atrial fibrillation: Secondary | ICD-10-CM | POA: Insufficient documentation

## 2023-03-23 DIAGNOSIS — R7989 Other specified abnormal findings of blood chemistry: Secondary | ICD-10-CM | POA: Insufficient documentation

## 2023-03-23 DIAGNOSIS — E785 Hyperlipidemia, unspecified: Secondary | ICD-10-CM | POA: Insufficient documentation

## 2023-03-23 DIAGNOSIS — I340 Nonrheumatic mitral (valve) insufficiency: Secondary | ICD-10-CM | POA: Insufficient documentation

## 2023-03-23 DIAGNOSIS — R739 Hyperglycemia, unspecified: Secondary | ICD-10-CM | POA: Insufficient documentation

## 2023-03-23 DIAGNOSIS — E871 Hypo-osmolality and hyponatremia: Secondary | ICD-10-CM | POA: Insufficient documentation

## 2023-03-23 DIAGNOSIS — G4733 Obstructive sleep apnea (adult) (pediatric): Secondary | ICD-10-CM | POA: Insufficient documentation

## 2023-03-23 DIAGNOSIS — I442 Atrioventricular block, complete: Secondary | ICD-10-CM | POA: Insufficient documentation

## 2023-03-23 DIAGNOSIS — J4530 Mild persistent asthma, uncomplicated: Secondary | ICD-10-CM | POA: Insufficient documentation

## 2023-03-23 DIAGNOSIS — Z8701 Personal history of pneumonia (recurrent): Secondary | ICD-10-CM | POA: Insufficient documentation

## 2023-03-23 NOTE — Telephone Encounter (Signed)
   Patient Name: Isaiah Murphy  DOB: 1934-04-12 MRN: [id number]  Primary Cardiologist: Lesleigh Noe, MD (Inactive)  Chart reviewed as part of pre-operative protocol coverage. Given past medical history and time since last visit, based on ACC/AHA guidelines, Isaiah Murphy is at acceptable risk for the planned procedure without further cardiovascular testing.   Patient does not require pre-op antibiotics for dental procedure.   Per office protocol, patient can hold Eliquis for 2 days prior to procedure.    I will route this recommendation to the requesting party via Epic fax function and remove from pre-op pool.  Please call with questions.  Napoleon Form, Leodis Rains, NP 03/23/2023, 9:52 AM

## 2023-03-23 NOTE — Telephone Encounter (Signed)
Patient with diagnosis of A Fib on Eliquis for anticoagulation.    Procedure: Root canal Date of procedure: 03/29/23   CHA2DS2-VASc Score = 3  his indicates a 3.2% annual risk of stroke. The patient's score is based upon: CHF History: 0 HTN History: 1 Diabetes History: 0 Stroke History: 0 Vascular Disease History: 0 Age Score: 2 Gender Score: 0    CrCl 47 ml/min Platelet count 175K  Patient does not require pre-op antibiotics for dental procedure.  Per office protocol, patient can hold Eliquis for 2 days prior to procedure.    **This guidance is not considered finalized until pre-operative APP has relayed final recommendations.**

## 2023-04-10 ENCOUNTER — Ambulatory Visit (INDEPENDENT_AMBULATORY_CARE_PROVIDER_SITE_OTHER): Payer: Medicare Other

## 2023-04-10 DIAGNOSIS — I442 Atrioventricular block, complete: Secondary | ICD-10-CM

## 2023-04-12 ENCOUNTER — Encounter: Payer: Self-pay | Admitting: Internal Medicine

## 2023-04-12 LAB — CUP PACEART REMOTE DEVICE CHECK
Battery Remaining Longevity: 143 mo
Battery Voltage: 3.04 V
Brady Statistic RA Percent Paced: 0 %
Brady Statistic RV Percent Paced: 93.4 %
Date Time Interrogation Session: 20250304102357
Implantable Lead Connection Status: 753985
Implantable Lead Connection Status: 753985
Implantable Lead Implant Date: 20150413
Implantable Lead Implant Date: 20150413
Implantable Lead Location: 753859
Implantable Lead Location: 753860
Implantable Lead Model: 5076
Implantable Lead Model: 5076
Implantable Pulse Generator Implant Date: 20240304
Lead Channel Impedance Value: 323 Ohm
Lead Channel Impedance Value: 399 Ohm
Lead Channel Impedance Value: 418 Ohm
Lead Channel Impedance Value: 532 Ohm
Lead Channel Pacing Threshold Amplitude: 0.875 V
Lead Channel Pacing Threshold Pulse Width: 0.4 ms
Lead Channel Sensing Intrinsic Amplitude: 1.125 mV
Lead Channel Sensing Intrinsic Amplitude: 1.125 mV
Lead Channel Sensing Intrinsic Amplitude: 19.875 mV
Lead Channel Sensing Intrinsic Amplitude: 19.875 mV
Lead Channel Setting Pacing Amplitude: 2 V
Lead Channel Setting Pacing Pulse Width: 0.4 ms
Lead Channel Setting Sensing Sensitivity: 2 mV
Zone Setting Status: 755011

## 2023-05-01 ENCOUNTER — Ambulatory Visit: Payer: Federal, State, Local not specified - PPO | Admitting: Allergy and Immunology

## 2023-05-07 ENCOUNTER — Other Ambulatory Visit: Payer: Self-pay | Admitting: Allergy and Immunology

## 2023-05-08 ENCOUNTER — Other Ambulatory Visit: Payer: Self-pay | Admitting: *Deleted

## 2023-05-08 MED ORDER — LEVOCETIRIZINE DIHYDROCHLORIDE 5 MG PO TABS: 5.0000 mg | ORAL_TABLET | Freq: Every evening | ORAL | 1 refills | Status: DC

## 2023-05-14 ENCOUNTER — Ambulatory Visit (HOSPITAL_COMMUNITY)
Admission: RE | Admit: 2023-05-14 | Discharge: 2023-05-14 | Disposition: A | Discharge status: Home / Self Care | Source: Ambulatory Visit | Attending: Surgery | Admitting: Surgery

## 2023-05-14 ENCOUNTER — Other Ambulatory Visit (HOSPITAL_COMMUNITY): Payer: Self-pay | Admitting: Internal Medicine

## 2023-05-14 DIAGNOSIS — R600 Localized edema: Secondary | ICD-10-CM

## 2023-05-15 NOTE — Progress Notes (Signed)
 Remote pacemaker transmission.

## 2023-05-15 NOTE — Addendum Note (Signed)
 Addended by: Geralyn Flash D on: 05/15/2023 10:09 AM   Modules accepted: Orders

## 2023-05-16 ENCOUNTER — Ambulatory Visit (INDEPENDENT_AMBULATORY_CARE_PROVIDER_SITE_OTHER)

## 2023-05-16 DIAGNOSIS — J4550 Severe persistent asthma, uncomplicated: Secondary | ICD-10-CM

## 2023-06-13 ENCOUNTER — Ambulatory Visit

## 2023-06-13 DIAGNOSIS — J4550 Severe persistent asthma, uncomplicated: Secondary | ICD-10-CM

## 2023-07-10 ENCOUNTER — Ambulatory Visit (INDEPENDENT_AMBULATORY_CARE_PROVIDER_SITE_OTHER): Payer: Medicare Other

## 2023-07-10 DIAGNOSIS — I442 Atrioventricular block, complete: Secondary | ICD-10-CM

## 2023-07-10 LAB — CUP PACEART REMOTE DEVICE CHECK
Battery Remaining Longevity: 138 mo
Battery Voltage: 3.02 V
Brady Statistic RA Percent Paced: 0 %
Brady Statistic RV Percent Paced: 95.98 %
Date Time Interrogation Session: 20250603132426
Implantable Lead Connection Status: 753985
Implantable Lead Connection Status: 753985
Implantable Lead Implant Date: 20150413
Implantable Lead Implant Date: 20150413
Implantable Lead Location: 753859
Implantable Lead Location: 753860
Implantable Lead Model: 5076
Implantable Lead Model: 5076
Implantable Pulse Generator Implant Date: 20240304
Lead Channel Impedance Value: 342 Ohm
Lead Channel Impedance Value: 342 Ohm
Lead Channel Impedance Value: 399 Ohm
Lead Channel Impedance Value: 475 Ohm
Lead Channel Pacing Threshold Amplitude: 0.875 V
Lead Channel Pacing Threshold Pulse Width: 0.4 ms
Lead Channel Sensing Intrinsic Amplitude: 1.25 mV
Lead Channel Sensing Intrinsic Amplitude: 1.25 mV
Lead Channel Sensing Intrinsic Amplitude: 19.875 mV
Lead Channel Sensing Intrinsic Amplitude: 19.875 mV
Lead Channel Setting Pacing Amplitude: 2 V
Lead Channel Setting Pacing Pulse Width: 0.4 ms
Lead Channel Setting Sensing Sensitivity: 2 mV
Zone Setting Status: 755011

## 2023-07-11 ENCOUNTER — Ambulatory Visit

## 2023-07-11 DIAGNOSIS — J4550 Severe persistent asthma, uncomplicated: Secondary | ICD-10-CM | POA: Diagnosis not present

## 2023-07-12 ENCOUNTER — Ambulatory Visit: Payer: Self-pay | Admitting: Internal Medicine

## 2023-07-18 ENCOUNTER — Encounter: Payer: Self-pay | Admitting: Podiatry

## 2023-07-18 ENCOUNTER — Ambulatory Visit (INDEPENDENT_AMBULATORY_CARE_PROVIDER_SITE_OTHER): Payer: Federal, State, Local not specified - PPO | Admitting: Podiatry

## 2023-07-18 DIAGNOSIS — B351 Tinea unguium: Secondary | ICD-10-CM

## 2023-07-18 DIAGNOSIS — Z7901 Long term (current) use of anticoagulants: Secondary | ICD-10-CM | POA: Diagnosis not present

## 2023-07-18 DIAGNOSIS — M79674 Pain in right toe(s): Secondary | ICD-10-CM

## 2023-07-18 DIAGNOSIS — M79675 Pain in left toe(s): Secondary | ICD-10-CM | POA: Diagnosis not present

## 2023-07-18 NOTE — Progress Notes (Addendum)
 This patient presents to the office with chief complaint of long thick painful  big nails.  Patient says the nails are painful walking and wearing shoes.  This patient is unable to self treat.  This patient is unable to trim his  nails since he is unable to reach his nails.  he presents to the office for preventative foot care services. He has coagulation defect due to eliquis .  General Appearance  Alert, conversant and in no acute stress.  Vascular  Dorsalis pedis and posterior tibial  pulses are palpable  bilaterally.  Capillary return is within normal limits  bilaterally. Temperature is within normal limits  bilaterally.  Neurologic  Senn-Weinstein monofilament wire test within normal limits  bilaterally. Muscle power within normal limits bilaterally.  Nails Thick disfigured discolored nails with subungual debris  hallux nails bilaterally. No evidence of bacterial infection or drainage bilaterally.  Orthopedic  No limitations of motion  feet .  No crepitus or effusions noted.  No bony pathology or digital deformities noted. HAV  B/L.  Skin  normotropic skin with no porokeratosis noted bilaterally.  No signs of infections or ulcers noted.     Onychomycosis  Nails  B/L.  Pain in right toes  Pain in left toes  Debridement of nails both feet followed trimming the nails with dremel tool.    RTC 3 months.   Ruffin Cotton DPM

## 2023-08-08 ENCOUNTER — Ambulatory Visit

## 2023-08-09 ENCOUNTER — Ambulatory Visit

## 2023-08-09 DIAGNOSIS — J4550 Severe persistent asthma, uncomplicated: Secondary | ICD-10-CM | POA: Diagnosis not present

## 2023-08-28 ENCOUNTER — Ambulatory Visit (INDEPENDENT_AMBULATORY_CARE_PROVIDER_SITE_OTHER): Payer: Federal, State, Local not specified - PPO | Admitting: Allergy and Immunology

## 2023-08-28 ENCOUNTER — Encounter: Payer: Self-pay | Admitting: Allergy and Immunology

## 2023-08-28 ENCOUNTER — Other Ambulatory Visit: Payer: Self-pay

## 2023-08-28 VITALS — BP 124/66 | HR 72 | Temp 97.4°F | Ht 69.0 in | Wt 171.3 lb

## 2023-08-28 DIAGNOSIS — J3089 Other allergic rhinitis: Secondary | ICD-10-CM | POA: Diagnosis not present

## 2023-08-28 DIAGNOSIS — K219 Gastro-esophageal reflux disease without esophagitis: Secondary | ICD-10-CM | POA: Diagnosis not present

## 2023-08-28 DIAGNOSIS — J4550 Severe persistent asthma, uncomplicated: Secondary | ICD-10-CM | POA: Diagnosis not present

## 2023-08-28 MED ORDER — BREZTRI AEROSPHERE 160-9-4.8 MCG/ACT IN AERO: 2.0000 | INHALATION_SPRAY | Freq: Two times a day (BID) | RESPIRATORY_TRACT | 1 refills | Status: DC

## 2023-08-28 MED ORDER — MONTELUKAST SODIUM 10 MG PO TABS: 10.0000 mg | ORAL_TABLET | Freq: Every evening | ORAL | 3 refills | Status: DC

## 2023-08-28 MED ORDER — FAMOTIDINE 40 MG PO TABS: 40.0000 mg | ORAL_TABLET | Freq: Every evening | ORAL | 3 refills | Status: DC

## 2023-08-28 MED ORDER — SPACER/AERO-HOLDING CHAMBERS DEVI: 1.0000 | 1 refills | Status: AC

## 2023-08-28 MED ORDER — FLUTICASONE PROPIONATE 50 MCG/ACT NA SUSP: 1.0000 | Freq: Two times a day (BID) | NASAL | 3 refills | Status: DC | PRN

## 2023-08-28 MED ORDER — ESOMEPRAZOLE MAGNESIUM 40 MG PO CPDR: 40.0000 mg | DELAYED_RELEASE_CAPSULE | Freq: Two times a day (BID) | ORAL | 3 refills | Status: DC

## 2023-08-28 MED ORDER — AIRSUPRA 90-80 MCG/ACT IN AERO: 2.0000 | INHALATION_SPRAY | RESPIRATORY_TRACT | 1 refills | Status: DC | PRN

## 2023-08-28 NOTE — Progress Notes (Unsigned)
 [place] - [place] - [place] - [place] - [place]   Follow-up Note  Referring Provider: Onita Rush, MD Primary Provider: Onita Rush, MD Date of Office Visit: 08/28/2023  Subjective:   Isaiah Murphy (DOB: [birth year]) is a 89 y.o. 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 He was last seen here on 03/06/2023 by Dr. Kozlow.  While on Breztri  twice daily, the patient says that he has had rare wheezing, less than once a month, for which he uses Airsupra  and finds quick improvement. He notices that sudden change in temperature can cause some wheezing. He denies shortness of breath and is able to attend to tennis without difficulty (outside of a recent ankle injury). The patient does not have nasal congestion, runny nose, or posterior nasal drainage, and uses montelukast , flonase  1-2 daily (increasing in Spring), and tezepelumab  monthly injections.  He continues on famotidine  daily and does not have throat clearing, coughing, or bad taste in his mouth outside of eating and feels that his reflux is well controlled.  Allergies as of 08/28/2023       Reactions   Penicillins Rash   Has patient had a PCN reaction causing immediate rash, facial/tongue/throat swelling, SOB or lightheadedness with hypotension: Yes Has patient had a PCN reaction causing severe rash involving mucus membranes or skin necrosis: Yes Has patient had a PCN reaction that required hospitalization: No Has patient had a PCN reaction occurring within the last 10 years: No If all of the above answers are NO, then may proceed with Cephalosporin use. Rash at the injection site        Medication List    Airsupra  90-80 MCG/ACT Aero Generic drug: Albuterol -Budesonide  Inhale 2 puffs into the lungs every 4 (four) hours as needed.   amLODipine  2.5 MG tablet Commonly  known as: NORVASC  Take 1 tablet by mouth daily.   atorvastatin  20 MG tablet Commonly known as: LIPITOR Take 20 mg by mouth at bedtime.   Breztri  Aerosphere 160-9-4.8 MCG/ACT Aero inhaler Generic drug: budesonide -glycopyrrolate-formoterol  Inhale 2 puffs into the lungs in the morning and at bedtime.   Edarbyclor  40-12.5 MG Tabs Generic drug: Azilsartan-Chlorthalidone  Take 1 tablet by mouth every evening.   Eliquis  5 MG Tabs tablet Generic drug: apixaban  TAKE 1 TABLET BY MOUTH TWICE A DAY   famotidine  40 MG tablet Commonly known as: PEPCID  Take 1 tablet (40 mg total) by mouth at bedtime.   fluticasone  50 MCG/ACT nasal spray Commonly known as: FLONASE  Place 1 spray into both nostrils 2 (two) times daily as needed for allergies or rhinitis.   lansoprazole  30 MG capsule Commonly known as: PREVACID  Take 1 capsule (30 mg total) by mouth daily at 12 noon.   levocetirizine 5 MG tablet Commonly known as: XYZAL  Take 1 tablet (5 mg total) by mouth every evening.   Mega Multi Men Tabs Take 1 tablet by mouth daily as needed.   montelukast  10 MG tablet Commonly known as: SINGULAIR  Take 1 tablet (10 mg total) by mouth at bedtime.   olopatadine  0.1 % ophthalmic solution Commonly known as: PATANOL Place 1 drop into both eyes daily.   predniSONE  20 MG tablet Commonly known as: DELTASONE  Take 2 tablets (40 mg total) by mouth daily.   Spacer/Aero-Holding [employer] 1 Device by Does not apply route as directed.   triamcinolone  cream 0.1 % Commonly known as:  KENALOG  Apply 1 Application topically 2 (two) times daily as needed (itching).    Past Medical History:  Diagnosis Date   Acute blood loss anemia    Allergic rhinitis    Arthritis    Asthma    Chronic anticoagulation    Constipation    Dyslipidemia    Dysrhythmia    history of paroxysmal A Fib and typical A Flutter    GERD (gastroesophageal reflux disease)    History of bronchitis    History of sick sinus  syndrome    History of syncope    HLD (hyperlipidemia)    Hx of syncope    Hypertension    Hyponatremia    Leukocytosis    Moderate persistent asthma with acute exacerbation in adult    Osteoarthritis    s/p R & L THR   Presence of permanent cardiac pacemaker    Primary osteoarthritis of right hip    Sleep apnea    pt states not currently using CPAP machine    Unsteady gait     Past Surgical History:  Procedure Laterality Date   CARDIOVERSION N/A 04/28/2013   Procedure: CARDIOVERSION;  Surgeon: Leim VEAR Moose, MD;  Location: [facility] ENDOSCOPY;  Service: Cardiovascular;  Laterality: N/A;   HEMORRHOID SURGERY     left shoulder surgery      secondary to torn ligament / subluxation   PPM GENERATOR CHANGEOUT N/A 04/10/2022   Procedure: PPM GENERATOR CHANGEOUT;  Surgeon: Waddell Danelle ORN, MD;  Location: [facility] INVASIVE CV LAB;  Service: Cardiovascular;  Laterality: N/A;   TEE WITHOUT CARDIOVERSION N/A 04/28/2013   Procedure: TRANSESOPHAGEAL ECHOCARDIOGRAM (TEE);  Surgeon: Leim VEAR Moose, MD;  Location: [facility] ENDOSCOPY;  Service: Cardiovascular;  Laterality: N/A;   TOTAL HIP ARTHROPLASTY Right 03/08/2015   Procedure: RIGHT TOTAL HIP ARTHROPLASTY ANTERIOR APPROACH;  Surgeon: Dempsey Moan, MD;  Location: [facility] ORS;  Service: Orthopedics;  Laterality: Right;   TOTAL HIP ARTHROPLASTY Left 01/07/2020   Procedure: TOTAL HIP ARTHROPLASTY ANTERIOR APPROACH;  Surgeon: Moan Dempsey, MD;  Location: [facility] ORS;  Service: Orthopedics;  Laterality: Left;     Review of systems negative except as noted in HPI / PMHx or noted below:  Review of Systems  Constitutional:  Negative for fever.  HENT:  Negative for congestion and sore throat.   Eyes:  Negative for discharge and redness.  Respiratory:  Positive for wheezing. Negative for cough and shortness of breath.   Cardiovascular:  Negative for chest pain.  Skin:  Negative for itching and rash.  Neurological:  Negative for dizziness and headaches.  All other  systems reviewed and are negative.    Objective:   Vitals:   08/28/23 1144  BP: 124/66  Pulse: 72  Temp: (!) 97.4 F (36.3 C)  SpO2: 97%   Height: 5' 9 (175.3 cm)  Weight: 171 lb 4.8 oz (77.7 kg)   Physical Exam Constitutional:      General: He is not in acute distress.    Appearance: Normal appearance. He is normal weight. He is not ill-appearing.  HENT:     Head: Normocephalic.     Nose: Nose normal. No congestion or rhinorrhea.  Cardiovascular:     Heart sounds: Murmur (systolic, RUSB) heard.  Pulmonary:     Effort: Pulmonary effort is normal.     Breath sounds: Normal breath sounds.  Musculoskeletal:        General: Normal range of motion.  Skin:    General: Skin is warm and dry.  Neurological:     General: No focal deficit present.     Mental Status: He is alert.  Psychiatric:        Mood and Affect: Mood normal.        Behavior: Behavior normal.     Diagnostics: Spirometry was performed and demonstrated an FEV1 of 1.89L at 85.5 % of predicted.  Assessment and Plan:   1. Asthma, severe persistent, well-controlled   2. Perennial allergic rhinitis   3. Gastroesophageal reflux disease, unspecified whether esophagitis present    1.  Treat and prevent inflammation of airway:   A. Breztri  - 2 inhalations 1-2 times per day w/ spacer  B. Flonase  - 1 spray each nostril 1-2  times per day C. Montelukast  10 mg - 1 tablet 1 time per day D. Tezepelumab  injections every 4 weeks.  2.  Treat and prevent reflux:  A.  Famotidine  40 mg - 1 tablet in evening   3. If needed:  A. AIRSUPRA   - 2 inhalations every 4-6 hours (replaces albuterol ) B. Esomeprazole  40 mg - 1 tablet 1-2 times per day  4. Return to clinic in 6 months or earlier if problem  5. Influenza = Tamiflu. 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 The patient has been doing well on Breztri  twice daily, flonase ,  montelukast , and tezepelumab  monthly injections. He has no complaints, FEV1 today is 86% (January was 90%), and uses his Airsupra  less than once a month. Since he is doing well, I do not recommend any changes to his regimen and he may alter his flonase  accordingly. He may follow up in one year or sooner if he develops a flare up.  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  In review, Dr. Irving is undergoing therapy for his respiratory tract inflammatory condition with the use of anti-TSL P antibody as well as other anti-inflammatory agents directed at his respiratory tract with a very good response.  He has excellent control of both his upper and lower airway on this plan and he also has very good control of his reflux while using a H2 receptor blocker.  Assuming he does well with the plan noted above we will see him back in this clinic in 6 months or earlier if there is a problem.  Camellia Denis, MD Allergy / Immunology [facility]

## 2023-08-28 NOTE — Patient Instructions (Addendum)
  1.  Treat and prevent inflammation of airway:   A. Breztri  - 2 inhalations 1-2 times per day w/ spacer  B. Flonase  - 1 spray each nostril 1-2  times per day C. Montelukast  10 mg - 1 tablet 1 time per day D. Tezepelumab  injections every 4 weeks.  2.  Treat and prevent reflux:  A.  Famotidine  40 mg - 1 tablet in evening   3. If needed:  A. AIRSUPRA   - 2 inhalations every 4-6 hours (replaces albuterol ) B. Esomeprazole  40 mg - 1 tablet 1-2 times per day  4. Return to clinic in 1 year or earlier if problem  5. Influenza = Tamiflu. Covid = Paxlovid

## 2023-08-29 ENCOUNTER — Encounter: Payer: Self-pay | Admitting: Allergy and Immunology

## 2023-09-04 NOTE — Progress Notes (Signed)
 Remote pacemaker transmission.

## 2023-09-05 ENCOUNTER — Other Ambulatory Visit: Payer: Self-pay

## 2023-09-05 NOTE — Telephone Encounter (Signed)
 Error

## 2023-09-06 ENCOUNTER — Ambulatory Visit (INDEPENDENT_AMBULATORY_CARE_PROVIDER_SITE_OTHER)

## 2023-09-06 DIAGNOSIS — J4550 Severe persistent asthma, uncomplicated: Secondary | ICD-10-CM | POA: Diagnosis not present

## 2023-10-04 ENCOUNTER — Ambulatory Visit (INDEPENDENT_AMBULATORY_CARE_PROVIDER_SITE_OTHER)

## 2023-10-04 DIAGNOSIS — J4550 Severe persistent asthma, uncomplicated: Secondary | ICD-10-CM

## 2023-10-23 ENCOUNTER — Encounter: Payer: Self-pay | Admitting: Podiatry

## 2023-10-23 ENCOUNTER — Ambulatory Visit (INDEPENDENT_AMBULATORY_CARE_PROVIDER_SITE_OTHER): Admitting: Podiatry

## 2023-10-23 DIAGNOSIS — M79674 Pain in right toe(s): Secondary | ICD-10-CM

## 2023-10-23 DIAGNOSIS — Z7901 Long term (current) use of anticoagulants: Secondary | ICD-10-CM

## 2023-10-23 DIAGNOSIS — B351 Tinea unguium: Secondary | ICD-10-CM | POA: Diagnosis not present

## 2023-10-23 DIAGNOSIS — M79675 Pain in left toe(s): Secondary | ICD-10-CM

## 2023-10-23 NOTE — Progress Notes (Signed)
 This patient presents to the office with chief complaint of long thick painful  big nails.  Patient says the nails are painful walking and wearing shoes.  This patient is unable to self treat.  This patient is unable to trim his  nails since he is unable to reach his nails.  he presents to the office for preventative foot care services. He has coagulation defect due to eliquis .  General Appearance  Alert, conversant and in no acute stress.  Vascular  Dorsalis pedis and posterior tibial  pulses are palpable  bilaterally.  Capillary return is within normal limits  bilaterally. Temperature is within normal limits  bilaterally.  Neurologic  Senn-Weinstein monofilament wire test within normal limits  bilaterally. Muscle power within normal limits bilaterally.  Nails Thick disfigured discolored nails with subungual debris  hallux nails bilaterally. No evidence of bacterial infection or drainage bilaterally.  Orthopedic  No limitations of motion  feet .  No crepitus or effusions noted.  No bony pathology or digital deformities noted. HAV  B/L.  Skin  normotropic skin with no porokeratosis noted bilaterally.  No signs of infections or ulcers noted.     Onychomycosis  Nails  B/L.  Pain in right toes  Pain in left toes  Debridement of nails both feet followed trimming the nails with dremel tool.    RTC 3 months.   Ruffin Cotton DPM

## 2023-11-01 ENCOUNTER — Ambulatory Visit (INDEPENDENT_AMBULATORY_CARE_PROVIDER_SITE_OTHER)

## 2023-11-01 DIAGNOSIS — J4550 Severe persistent asthma, uncomplicated: Secondary | ICD-10-CM | POA: Diagnosis not present

## 2023-11-07 ENCOUNTER — Other Ambulatory Visit: Payer: Self-pay | Admitting: Allergy and Immunology

## 2023-11-20 ENCOUNTER — Telehealth: Payer: Self-pay | Admitting: Allergy and Immunology

## 2023-11-20 NOTE — Telephone Encounter (Signed)
 Isaiah Murphy called and stated he needs refills of montelukast  (SINGULAIR ) 10 MG tablet [[id number]] , and of famotidine  (PEPCID ) 40 MG tablet [[id number]] They both show they have refills but the pharmacy told him he needed to reach out to our office. [facility]

## 2023-11-21 MED ORDER — FAMOTIDINE 40 MG PO TABS: 40.0000 mg | ORAL_TABLET | Freq: Every evening | ORAL | 1 refills | Status: DC

## 2023-11-21 MED ORDER — MONTELUKAST SODIUM 10 MG PO TABS: 10.0000 mg | ORAL_TABLET | Freq: Every evening | ORAL | 1 refills | Status: DC

## 2023-11-21 NOTE — Addendum Note (Signed)
 Addended by: ONEITA CHRISTIANS D on: 11/21/2023 09:22 AM   Modules accepted: Orders

## 2023-11-21 NOTE — Telephone Encounter (Signed)
 Both prescriptions have been sent

## 2023-11-29 ENCOUNTER — Ambulatory Visit (INDEPENDENT_AMBULATORY_CARE_PROVIDER_SITE_OTHER)

## 2023-11-29 DIAGNOSIS — J4550 Severe persistent asthma, uncomplicated: Secondary | ICD-10-CM

## 2023-12-13 ENCOUNTER — Encounter: Payer: Self-pay | Admitting: Cardiology

## 2023-12-13 ENCOUNTER — Ambulatory Visit: Attending: Cardiology | Admitting: Cardiology

## 2023-12-13 VITALS — BP 128/81 | HR 77 | Resp 16 | Ht 69.0 in | Wt 173.4 lb

## 2023-12-13 DIAGNOSIS — Z7901 Long term (current) use of anticoagulants: Secondary | ICD-10-CM | POA: Insufficient documentation

## 2023-12-13 DIAGNOSIS — I442 Atrioventricular block, complete: Secondary | ICD-10-CM | POA: Insufficient documentation

## 2023-12-13 DIAGNOSIS — I4811 Longstanding persistent atrial fibrillation: Secondary | ICD-10-CM | POA: Diagnosis present

## 2023-12-13 DIAGNOSIS — Z950 Presence of cardiac pacemaker: Secondary | ICD-10-CM | POA: Insufficient documentation

## 2023-12-13 DIAGNOSIS — R0602 Shortness of breath: Secondary | ICD-10-CM | POA: Insufficient documentation

## 2023-12-13 DIAGNOSIS — I10 Essential (primary) hypertension: Secondary | ICD-10-CM | POA: Diagnosis present

## 2023-12-13 NOTE — Patient Instructions (Signed)
 Medication Instructions:  Your physician recommends that you continue on your current medications as directed. Please refer to the Current Medication list given to you today.  *If you need a refill on your cardiac medications before your next appointment, please call your pharmacy*  Lab Work: None ordered.  If you have labs (blood work) drawn today and your tests are completely normal, you will receive your results only by: MyChart Message (if you have MyChart) OR A paper copy in the mail If you have any lab test that is abnormal or we need to change your treatment, we will call you to review the results.  Testing/Procedures: Your physician has requested that you have an echocardiogram. Echocardiography is a painless test that uses sound waves to create images of your heart. It provides your doctor with information about the size and shape of your heart and how well your heart's chambers and valves are working. This procedure takes approximately one hour. There are no restrictions for this procedure. Please do NOT wear cologne, perfume, aftershave, or lotions (deodorant is allowed). Please arrive 15 minutes prior to your appointment time.  Please note: We ask at that you not bring children with you during ultrasound (echo/ vascular) testing. Due to room size and safety concerns, children are not allowed in the ultrasound rooms during exams. Our front office staff cannot provide observation of children in our lobby area while testing is being conducted. An adult accompanying a patient to their appointment will only be allowed in the ultrasound room at the discretion of the ultrasound technician under special circumstances. We apologize for any inconvenience.    Follow-Up: At [facility], you and your health needs are our priority.  As part of our continuing mission to provide you with exceptional heart care, our providers are all part of one team.  This team includes your primary  Cardiologist (physician) and Advanced Practice Providers or APPs (Physician Assistants and Nurse Practitioners) who all work together to provide you with the care you need, when you need it.  Your next appointment:   12 months with Dr Michele  We recommend signing up for the patient portal called MyChart.  Sign up information is provided on this After Visit Summary.  MyChart is used to connect with patients for Virtual Visits (Telemedicine).  Patients are able to view lab/test results, encounter notes, upcoming appointments, etc.  Non-urgent messages can be sent to your provider as well.   To learn more about what you can do with MyChart, go to [email or web address].

## 2023-12-13 NOTE — Progress Notes (Signed)
 Cardiology Office Note:  .   Date:  12/13/2023  ID:  Isaiah Murphy, DOB 04-07-1934, MRN [id number] PCP:  Onita Rush, MD  Former Cardiology Providers: Victory LELON Claudene DOUGLAS, MD  [facility] Providers Cardiologist:  Madonna Large, DO , [facility] (established care 12/13/23) Electrophysiologist:  None  Click to update primary MD,subspecialty MD or APP then REFRESH:1}    Chief Complaint  Patient presents with   Complete heart block     History of Present Illness: .   Isaiah Murphy is a 89 y.o. African-American male whose past medical history and cardiovascular risk factors includes: Hypertension, sinus node dysfunction/complete heart block/pacemaker implant, longstanding persistent atrial fibrillation, hyperlipidemia hypertension, Aortic atherosclerosis.  Formally under the care of Dr. Claudene who last saw Isaiah Murphy back in October 2023. I am seeing him for the first time to re-establishing care.   Since last office visit [name] denies any anginal chest pain or heart failure symptoms.   Shortness of breath with effort related activities is chronic and stable. No hospitalizations or urgent care visits for cardiovascular reasons.   He has been compliant with his medical therapy.   Physical endurance remains stable.  Compliant with medications, endorses no issues.  Does not endorse evidence of bleeding. Had labs with PCP in October 2025 which were reviewed via Care Everywhere.  Based on his prior pacemaker reports he is noted to have either long persistent/permanent atrial fibrillation.  And he   Review of Systems: .   Review of Systems  Cardiovascular:  Positive for dyspnea on exertion. Negative for chest pain, claudication, irregular heartbeat, leg swelling, near-syncope, orthopnea, palpitations, paroxysmal nocturnal dyspnea and syncope.  Hematologic/Lymphatic: Negative for bleeding problem.    Studies Reviewed:   EKG: EKG Interpretation Date/Time:  Thursday  December 13 2023 11:18:52 EST Ventricular Rate:  78 PR Interval:    QRS Duration:  168 QT Interval:  418 QTC Calculation: 476 R Axis:   128  Text Interpretation: Atrial fibrillation CONTROLLED VENTRICULAR RESPONSE Ventricular-paced rhythm When compared with ECG of 15-Jun-2017 20:47, Atrial fibrillation with paced rhythm has replaced Sinus rhythm with paced rhythm Reconfirmed by Large Madonna [phone]) on 12/13/2023 12:16:18 PM  Echocardiogram: 04/2013 - Left ventricle: Wall thickness was increased in a pattern    of moderate LVH. Systolic function was low normal with the    estimated ejection fraction was 50%,. Wall motion was    normal; there were no regional wall motion abnormalities.  - Aortic valve: Structurally normal valve. Trileaflet;    normal thickness leaflets. No significant regurgitation.  - Aorta: Mild atherosclerotic disease in the descending    thoracic aorta.  - Mitral valve: Mild regurgitation.  - Left atrium: No evidence of thrombus in the atrial cavity    or appendage. No evidence of thrombus in the atrial cavity    or appendage. No evidence of thrombus in the appendage.  - Right ventricle: Systolic function was mildly reduced.  - Right atrium: The atrium was moderately dilated. No    evidence of thrombus in the atrial cavity or appendage. No    evidence of thrombus in the appendage.  - Atrial septum: No defect or patent foramen ovale was    identified.   RADIOLOGY: N/A  Risk Assessment/Calculations:   Click Here to Calculate/Change CHADS2VASc Score The patient's CHADS2-VASc score is 4, indicating a 4.8% annual risk of stroke.  CHF History: No HTN History: Yes Diabetes History: No Stroke History: No Vascular Disease History:  Yes    Labs:       Oja:RNFEOZUZ METABOLIC (Order Date - 11/09/2023) (Collection Date & Time - 11/09/2023 07:04 AM)           Value Reference Range          GLUCOSE 101   60 - 110 - mg/dl          TOTAL BILIRUBIN 2.2 H 0.0 - 1.5 -  mg/dl          CREATININE 1.1   0.6 - 1.3 - mg/dl          eGFR Non-African American 63.3   < -          eGFR African American 76.6   < -          SODIUM 139   135 - 148 - mEq/L          POTASSIUM 3.7   3.5 - 5.3 - mEq/L          CHLORIDE 103   80 - 111 - mEq/L          CO2 30   15 - 35 - mEq/L          CALCIUM  8.7   7.0 - 10.5 - mg/dL          TOTAL PROTEIN 6.5   6.0 - 8.5 - g/dL          ALBUMIN 3.9   2.0 - 5.5 - g/dL          AST 29   7 - 45 - IU/L          ALT 20   5 - 40 - IU/L          ALK PHOS 102   37 - 137 - IU/L          BUN 15   5 - 23 - mg/dl          Notes: APE labs       Lab:CBC (Order Date - 11/09/2023) (Collection Date & Time - 11/09/2023 07:04 AM)           Value Reference Range          WBC 7.50   4.10 - 10.90 - K/uL          LYM 1.8   0.6 - 4.1 - K/uL          EOS 0.1   0.0 - 0.4 -          BASO 0.1   0.0 - 0.2 -          LYM% 24.1   10.0 - 58.5 - %          EOS% 1.2   0.0 - 7.8 -          BASO% 0.8   0.0 - 2.0 -          RBC 4.6   4.2 - 6.3 - M/uL          HGB 15.2   12.0 - 18.0 - g/dL          HCT 55.6   62.9 - 51.0 - %          MCV 95.9   80.0 - 97.0 - fL          MCH 32.9 H 26.0 - 32.0 - pg          MCHC 34.3   31.0 - 36.0 - g/dL  RDW 14.2   12.3 - 15.4 -          PLT 168   140 - 440 - K/uL          MPV 8.5   6.9 - 10.6 -          NEU 4.6   1.4 - 7.0 -          NEU% 61.1   43.3 - 71.9 -          MONO 1.0 H 0.1 - 0.9 -          MONO% 12.8 H 4.6 - 12.4 -          Notes: APE labs       Oja:OPEPI (Order Date - 11/09/2023) (Collection Date & Time - 11/09/2023 07:04 AM)           Value Reference Range          CHOLESTEROL 103   100 - 200 - mg/dl          TRIGLYCERIDES 58   0 - 151 - mg/dl          HDL 40   40 - 60 - mg/dl          LDL 51   < - mg/dl          CHOL/HDL RATIO 2.6   < - RATIO          LDL/HDL RATIO 1.3 L 1.7 - 2.5 -          NON-HDL 63.0   < -          Notes: APE labs    - - Lab:A1C   - Collection Date 11/09/2023  10/26/2022 09/29/2021 08/31/2020  Collection Time 07:04 AM 07:00 AM 07:00 AM 07:42 AM  Order Date 11/09/2023 10/26/2022 09/29/2021 08/31/2020  A1C 5.1 (Ref Range: 4.5 - 6.0 %) 5.4 (Ref Range: 4.5 - 6.0 %) 5.2 (Ref Range: 4.5 - 6.0 %) 5.3 (Ref Range: 4.5 - 6.0 %)  Notes: APE labs physical labs. PSA 6.256 is stable and TSH 7.2. I need a free T4 - make sure it is Pending or please add. Physical labs to be discussed at visit. add free T4 due to the abnormal TSH physical labs to be discussed at upcoming Visit. please have lab add Free t4 and Free PSA percent         Lab:PSA, Total (Order Date - 11/09/2023) (Collection Date & Time - 11/09/2023 07:04 AM)           Value Reference Range          Prostate Specific Ag 8.7 H 0.0-4.0 - ng/mL          Notes: APE labs       Lab:TSH (Order Date - 11/09/2023) (Collection Date & Time - 11/09/2023 07:04 AM)           Value Reference Range          TSH 8.610 H 0.450-4.500 - uIU/mL          Notes: APE labs       Oja:Jenopenemnuzpw B (Order Date - 11/09/2023) (Collection Date & Time - 11/09/2023 07:04 AM)           Value Reference Range          Apolipoprotein B 48   <90 - mg/dL          Notes: APE labs       Oja:Fprmnjo/Rmzju  Ratio UA-TPB (Order Date - 11/02/2022) (Collection Date & Time - 11/02/2022 10:46 AM)           Value Reference Range          Creatinine, Urine 167.9   Not Estab. - mg/dL          Albumin, Urine 593.2   Not Estab. - ug/mL          Alb/Creat Ratio 353 H 0-29 - mg/g creat          Notes: UA, micro OK and microalb +. Only 3-10 RBCsRusso, John Michael 11/06/2022 06:40:02 PM > some microalbuminuria for ? reason. recheck Microalb in 4 weeks c SPEPCross, Rosina 11/07/2022 08:17:20 AM > See telephone note r/t lab results    - - Lab:TSH   - Collection Date 10/26/2022 09/29/2021 08/31/2020  Collection Time 07:00 AM 07:00 AM 07:42 AM  Order Date 10/26/2022 09/29/2021 08/31/2020  TSH 7.20    H (Ref Range: 0.40 - 4.20 uIU/ml) 4.54     H (Ref Range: 0.40 - 4.20 uIU/ml) 4.41    H (Ref Range: 0.40 - 4.20 uIU/ml)        Physical Exam:    Today's Vitals   12/13/23 1116  BP: 128/81  Pulse: 77  Resp: 16  SpO2: 97%  Weight: 173 lb 6.4 oz (78.7 kg)  Height: 5' 9 (1.753 m)   Body mass index is 25.61 kg/m. Wt Readings from Last 3 Encounters:  12/13/23 173 lb 6.4 oz (78.7 kg)  08/28/23 171 lb 4.8 oz (77.7 kg)  03/06/23 176 lb 4.8 oz (80 kg)    Physical Exam  Constitutional: No distress.  hemodynamically stable  Neck: No JVD present.  Cardiovascular: Normal rate, S1 normal and S2 normal. An irregularly irregular rhythm present. Exam reveals no gallop, no S3 and no S4.  No murmur heard. Pulmonary/Chest: Effort normal and breath sounds normal. No stridor. He has no wheezes. He has no rales.  Left infraclavicular pacemaker in situ  Musculoskeletal:        General: No edema.     Cervical back: Neck supple.  Skin: Skin is warm.    Impression & Recommendation(s):  Impression:   ICD-10-CM   1. Longstanding persistent atrial fibrillation (HCC)  I48.11     2. Long term (current) use of anticoagulants  Z79.01     3. Shortness of breath  R06.02 ECHOCARDIOGRAM COMPLETE    4. Benign hypertension  I10     5. Complete heart block (HCC)  I44.2 EKG 12-Lead    6. Pacemaker  Z95.0        Recommendation(s):  Longstanding persistent atrial fibrillation (HCC) Based on prior pacemaker reports patient had noted to have longstanding/permanent persistent atrial fibrillation Rate control: N/A. Rhythm control: N/A. Thromboembolic prophylaxis: Eliquis  Denies anginal chest pain or heart failure symptoms. Does have shortness of breath with over exertional activities, which is chronic and stable.  No increase in intensity frequency or duration  Long term (current) use of anticoagulants Currently on Eliquis  5 mg p.o. twice daily. Labs from PCPs office reviewed and mentioned above. Risks, benefits, and alternatives to  anticoagulation discussed  Shortness of breath Multifactorial Not in overt heart failure Echo will be ordered to evaluate for structural heart disease and left ventricular systolic function.  Benign hypertension Office blood pressures are well-controlled. Continue amlodipine  2.5 mg p.o. daily. Continue Edarbyclor  40/12.5 mg p.o. every morning. Reemphasized importance of low-salt diet.  Complete heart block (HCC) Pacemaker EKG illustrates ventricular paced  rhythm Continues to follow with device clinic and EP.  Last progress note reviewed   Orders Placed:  Orders Placed This Encounter  Procedures   EKG 12-Lead   ECHOCARDIOGRAM COMPLETE    Standing Status:   Future    Expiration Date:   12/12/2024    Where should this test [facility] performed[facility]    Does the patient weigh less than or greater than 250 lbs?:   Patient weighs less than 250 lbs    Perflutren DEFINITY (image enhancing agent) should be administered unless hypersensitivity or allergy exist:   Administer Perflutren    Reason for exam-Echo:   Other-Full Diagnosis List    Full ICD-10/Reason for Exam:   Shortness of breath [786.05.ICD-9-CM]     Final Medication List:   No orders of the defined types were placed in this encounter.   Medications Discontinued During This Encounter  Medication Reason   predniSONE  (DELTASONE ) 20 MG tablet Completed Course   predniSONE  (DELTASONE ) 10 MG tablet Completed Course   lansoprazole  (PREVACID ) 30 MG capsule Patient Preference   Multiple Vitamins-Minerals (MEGA MULTI MEN) TABS      Current Outpatient Medications:    Albuterol -Budesonide  (AIRSUPRA ) 90-80 MCG/ACT AERO, Inhale 2 puffs into the lungs every 4 (four) hours as needed., Disp: 10.7 g, Rfl: 1   amLODipine  (NORVASC ) 2.5 MG tablet, Take 1 tablet by mouth daily., Disp: , Rfl:    apixaban  (ELIQUIS ) 5 MG TABS tablet, TAKE 1 TABLET BY MOUTH TWICE A DAY, Disp: 60 tablet, Rfl: 5   atorvastatin  (LIPITOR) 20 MG tablet,  Take 20 mg by mouth at bedtime. , Disp: , Rfl:    Azilsartan-Chlorthalidone  (EDARBYCLOR ) 40-12.5 MG TABS, Take 1 tablet by mouth every evening., Disp: , Rfl:    budesonide -glycopyrrolate-formoterol  (BREZTRI  AEROSPHERE) 160-9-4.8 MCG/ACT AERO inhaler, Inhale 2 puffs into the lungs in the morning and at bedtime., Disp: 32.1 g, Rfl: 1   esomeprazole  (NEXIUM ) 40 MG capsule, Take 1 capsule (40 mg total) by mouth in the morning and at bedtime., Disp: 180 capsule, Rfl: 3   famotidine  (PEPCID ) 40 MG tablet, Take 1 tablet (40 mg total) by mouth at bedtime., Disp: 90 tablet, Rfl: 1   fluticasone  (FLONASE ) 50 MCG/ACT nasal spray, Place 1 spray into both nostrils 2 (two) times daily as needed for allergies or rhinitis., Disp: 48 g, Rfl: 3   levocetirizine (XYZAL ) 5 MG tablet, TAKE 1 TABLET BY MOUTH EVERY DAY IN THE EVENING, Disp: 90 tablet, Rfl: 1   montelukast  (SINGULAIR ) 10 MG tablet, Take 1 tablet (10 mg total) by mouth at bedtime., Disp: 90 tablet, Rfl: 1   olopatadine  (PATANOL) 0.1 % ophthalmic solution, Place 1 drop into both eyes daily., Disp: 15 mL, Rfl: 1   Spacer/Aero-Holding Chambers DEVI, 1 Device by Does not apply route as directed., Disp: 1 each, Rfl: 1   triamcinolone  cream (KENALOG ) 0.1 %, Apply 1 Application topically 2 (two) times daily as needed (itching)., Disp: 454 g, Rfl: 1  Current Facility-Administered Medications:    tezepelumab -ekko (TEZSPIRE ) 210 MG/1. syringe 210 mg, 210 mg, Subcutaneous, Q28 days, Kozlow, Camellia PARAS, MD, 210 mg at 11/29/23 1102  Consent:   NA  Disposition:   1 year follow-up sooner if needed  His questions and concerns were addressed to his satisfaction. He voices understanding of the recommendations provided during this encounter.    Signed, Madonna Large, DO, [facility]  A Division of [facility] [address] [place],  [place] [address]  12/13/2023 12:55 PM

## 2023-12-27 ENCOUNTER — Ambulatory Visit

## 2023-12-27 DIAGNOSIS — J4550 Severe persistent asthma, uncomplicated: Secondary | ICD-10-CM

## 2024-01-08 ENCOUNTER — Ambulatory Visit: Admitting: Allergy and Immunology

## 2024-01-08 ENCOUNTER — Other Ambulatory Visit: Payer: Self-pay

## 2024-01-08 ENCOUNTER — Ambulatory Visit: Payer: Medicare Other

## 2024-01-08 VITALS — BP 128/84 | HR 74 | Temp 98.0°F | Resp 14 | Ht 69.5 in | Wt 172.6 lb

## 2024-01-08 DIAGNOSIS — J4551 Severe persistent asthma with (acute) exacerbation: Secondary | ICD-10-CM

## 2024-01-08 DIAGNOSIS — B9789 Other viral agents as the cause of diseases classified elsewhere: Secondary | ICD-10-CM

## 2024-01-08 DIAGNOSIS — J3089 Other allergic rhinitis: Secondary | ICD-10-CM | POA: Diagnosis not present

## 2024-01-08 DIAGNOSIS — K219 Gastro-esophageal reflux disease without esophagitis: Secondary | ICD-10-CM | POA: Diagnosis not present

## 2024-01-08 DIAGNOSIS — J988 Other specified respiratory disorders: Secondary | ICD-10-CM | POA: Diagnosis not present

## 2024-01-08 DIAGNOSIS — I442 Atrioventricular block, complete: Secondary | ICD-10-CM

## 2024-01-08 MED ORDER — FAMOTIDINE 40 MG PO TABS: 40.0000 mg | ORAL_TABLET | Freq: Every evening | ORAL | 1 refills | Status: DC

## 2024-01-08 MED ORDER — FLUTICASONE PROPIONATE 50 MCG/ACT NA SUSP: 1.0000 | Freq: Two times a day (BID) | NASAL | 3 refills | Status: AC | PRN

## 2024-01-08 MED ORDER — AIRSUPRA 90-80 MCG/ACT IN AERO: 2.0000 | INHALATION_SPRAY | RESPIRATORY_TRACT | 1 refills | Status: AC | PRN

## 2024-01-08 MED ORDER — ESOMEPRAZOLE MAGNESIUM 40 MG PO CPDR: 40.0000 mg | DELAYED_RELEASE_CAPSULE | Freq: Two times a day (BID) | ORAL | 3 refills | Status: DC

## 2024-01-08 MED ORDER — PREDNISONE 10 MG PO TABS: 10.0000 mg | ORAL_TABLET | Freq: Every day | ORAL | 0 refills | Status: AC

## 2024-01-08 MED ORDER — MONTELUKAST SODIUM 10 MG PO TABS: 10.0000 mg | ORAL_TABLET | Freq: Every evening | ORAL | 1 refills | Status: AC

## 2024-01-08 MED ORDER — BREZTRI AEROSPHERE 160-9-4.8 MCG/ACT IN AERO: 2.0000 | INHALATION_SPRAY | Freq: Two times a day (BID) | RESPIRATORY_TRACT | 1 refills | Status: AC

## 2024-01-08 NOTE — Patient Instructions (Addendum)
  1.  Treat and prevent inflammation of airway:   A. Breztri  - 2 inhalations 1-2 times per day w/ spacer  B. Flonase  - 1 spray each nostril 1-2  times per day C. Montelukast  10 mg - 1 tablet 1 time per day D. Tezepelumab  injections every 4 weeks.  2.  Treat and prevent reflux:  A.  Famotidine  40 mg - 1 tablet in evening  B.  Esomeprazole  40 mg - 1 tablet 1-2 times per day  3. If needed:  A. AIRSUPRA   - 2 inhalations every 4-6 hours (replaces albuterol )  4. For this recent event:  A.  Attempt to minimize any throat clearing or coughing.  Replace with drinking/swallowing B.  Increase Esomeprazole  - twice a day C.  Increase Breztri  - twice a day D.  Start OTC Mucinex  DM - twice a day E.  Prednisone  10 mg - 1 tablet daily for 5 days F.  Check a home combo covid/flu swab  5. Return to clinic in 1 year or earlier if problem  6. Influenza = Tamiflu. Covid = Paxlovid

## 2024-01-08 NOTE — Progress Notes (Unsigned)
 [place] [facility] [place] [facility] [place] [facility] [place] [facility] [place]   Follow-up Note  Referring Provider: Onita Rush, MD Primary Provider: Onita Rush, MD Date of Office Visit: 01/08/2024  Subjective:   Isaiah Murphy (DOB: November 15, 1934) is a 89 y.o. 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  I last saw him in this clinic 28 August 2023.  He was doing wonderful without any significant upper airway or lower airway issues and no issues with reflux while he was consistently using his tezepelumab  injections along with Breztri  once a day and Flonase  once a day and montelukast  and famotidine  once a day.  He did not require systemic steroid or an antibiotic for any type of airway issue and he could exercise without any problem and did not require short acting bronchodilator.  Unfortunately, 3 days ago he developed acute onset of cough and that has become a very significant cough with spells of coughing and some retching and lots of throat clearing for his throat feels a little bit irritated maybe a little bit of nasal drip but no significant nasal congestion and no ugly nasal discharge and no anosmia.  He has not had any chest pain or sputum production and he has not had any fever or associated systemic or constitutional symptoms with this event.  It did appear to start after being outdoors raking leaves but it is definitely gotten much worse as the past 3 days have progressed.  Allergies as of 01/08/2024       Reactions   Penicillins Rash   Has patient had a PCN reaction causing immediate rash, facial/tongue/throat swelling, SOB or lightheadedness with hypotension: Yes Has patient had a PCN reaction causing severe rash involving mucus membranes or skin necrosis: Yes Has patient had a PCN reaction that required hospitalization: No Has patient had a PCN  reaction occurring within the last 10 years: No If all of the above answers are NO, then may proceed with Cephalosporin use. Rash at the injection site        Medication List    Airsupra  90-80 MCG/ACT Aero Generic drug: Albuterol -Budesonide  Inhale 2 puffs into the lungs every 4 (four) hours as needed.   amLODipine  2.5 MG tablet Commonly known as: NORVASC  Take 1 tablet by mouth daily.   atorvastatin  20 MG tablet Commonly known as: LIPITOR Take 20 mg by mouth at bedtime.   Breztri  Aerosphere 160-9-4.8 MCG/ACT Aero inhaler Generic drug: budesonide -glycopyrrolate-formoterol  Inhale 2 puffs into the lungs in the morning and at bedtime.   Edarbyclor  40-12.5 MG Tabs Generic drug: Azilsartan-Chlorthalidone  Take 1 tablet by mouth every evening.   Eliquis  5 MG Tabs tablet Generic drug: apixaban  TAKE 1 TABLET BY MOUTH TWICE A DAY   esomeprazole  40 MG capsule Commonly known as: NexIUM  Take 1 capsule (40 mg total) by mouth in the morning and at bedtime.   famotidine  40 MG tablet Commonly known as: PEPCID  Take 1 tablet (40 mg total) by mouth at bedtime.   fluticasone  50 MCG/ACT nasal spray Commonly known as: FLONASE  Place 1 spray into both nostrils 2 (two) times daily as needed for allergies or rhinitis.   levocetirizine 5 MG tablet Commonly known as: XYZAL  TAKE 1 TABLET BY MOUTH EVERY DAY IN THE EVENING   montelukast  10 MG tablet Commonly known as: SINGULAIR  Take 1 tablet (10 mg total) by mouth at bedtime.  olopatadine  0.1 % ophthalmic solution Commonly known as: PATANOL Place 1 drop into both eyes daily.   Spacer/Aero-Holding [employer] 1 Device by Does not apply route as directed.   triamcinolone  cream 0.1 % Commonly known as: KENALOG  Apply 1 Application topically 2 (two) times daily as needed (itching).      Past Medical History:  Diagnosis Date   Acute blood loss anemia    Allergic rhinitis    Arthritis    Asthma    Chronic anticoagulation     Constipation    Dyslipidemia    Dysrhythmia    history of paroxysmal A Fib and typical A Flutter    GERD (gastroesophageal reflux disease)    History of bronchitis    History of sick sinus syndrome    History of syncope    HLD (hyperlipidemia)    Hx of syncope    Hypertension    Hyponatremia    Leukocytosis    Moderate persistent asthma with acute exacerbation in adult    Osteoarthritis    s/p R & L THR   Presence of permanent cardiac pacemaker    Primary osteoarthritis of right hip    Sleep apnea    pt states not currently using CPAP machine    Unsteady gait     Past Surgical History:  Procedure Laterality Date   CARDIOVERSION N/A 04/28/2013   Procedure: CARDIOVERSION;  Surgeon: Leim VEAR Moose, MD;  Location: [facility] ENDOSCOPY;  Service: Cardiovascular;  Laterality: N/A;   HEMORRHOID SURGERY     left shoulder surgery      secondary to torn ligament / subluxation   PPM GENERATOR CHANGEOUT N/A 04/10/2022   Procedure: PPM GENERATOR CHANGEOUT;  Surgeon: Waddell Danelle ORN, MD;  Location: [facility] INVASIVE CV LAB;  Service: Cardiovascular;  Laterality: N/A;   TEE WITHOUT CARDIOVERSION N/A 04/28/2013   Procedure: TRANSESOPHAGEAL ECHOCARDIOGRAM (TEE);  Surgeon: Leim VEAR Moose, MD;  Location: [facility] ENDOSCOPY;  Service: Cardiovascular;  Laterality: N/A;   TOTAL HIP ARTHROPLASTY Right 03/08/2015   Procedure: RIGHT TOTAL HIP ARTHROPLASTY ANTERIOR APPROACH;  Surgeon: Dempsey Moan, MD;  Location: [facility] ORS;  Service: Orthopedics;  Laterality: Right;   TOTAL HIP ARTHROPLASTY Left 01/07/2020   Procedure: TOTAL HIP ARTHROPLASTY ANTERIOR APPROACH;  Surgeon: Moan Dempsey, MD;  Location: [facility] ORS;  Service: Orthopedics;  Laterality: Left;     Review of systems negative except as noted in HPI / PMHx or noted below:  Review of Systems  Constitutional: Negative.   HENT: Negative.    Eyes: Negative.   Respiratory: Negative.    Cardiovascular: Negative.   Gastrointestinal: Negative.   Genitourinary:  Negative.   Musculoskeletal: Negative.   Skin: Negative.   Neurological: Negative.   Endo/Heme/Allergies: Negative.   Psychiatric/Behavioral: Negative.       Objective:   Vitals:   01/08/24 1036  BP: 128/84  Pulse: 74  Resp: 14  Temp: 98 F (36.7 C)  SpO2: 99%   Height: 5' 9.5 (176.5 cm)  Weight: 172 lb 9.6 oz (78.3 kg)   Physical Exam Constitutional:      Appearance: He is not diaphoretic.  HENT:     Head: Normocephalic.     Right Ear: Tympanic membrane, ear canal and external ear normal.     Left Ear: Tympanic membrane, ear canal and external ear normal.     Nose: Nose normal. No mucosal edema or rhinorrhea.     Mouth/Throat:     Pharynx: Uvula midline. No oropharyngeal exudate.  Eyes:  Conjunctiva/sclera: Conjunctivae normal.  Neck:     Thyroid: No thyromegaly.     Trachea: Trachea normal. No tracheal tenderness or tracheal deviation.  Cardiovascular:     Rate and Rhythm: Normal rate and regular rhythm.     Heart sounds: S1 normal and S2 normal. Murmur (Systolic) heard.  Pulmonary:     Effort: No respiratory distress.     Breath sounds: Normal breath sounds. No stridor. No wheezing or rales.  Lymphadenopathy:     Head:     Right side of head: No tonsillar adenopathy.     Left side of head: No tonsillar adenopathy.     Cervical: No cervical adenopathy.  Skin:    Findings: No erythema or rash.     Nails: There is no clubbing.  Neurological:     Mental Status: He is alert.     Diagnostics: Spirometry was performed and demonstrated an FEV1 of 1.97 at 90 % of predicted.  Assessment and Plan:   1. Severe persistent asthma with (acute) exacerbation (HCC)   2. Perennial allergic rhinitis   3. Gastroesophageal reflux disease, unspecified whether esophagitis present   4. Viral respiratory infection     1.  Treat and prevent inflammation of airway:   A. Breztri  - 2 inhalations 1-2 times per day w/ spacer  B. Flonase  - 1 spray each nostril 1-2  times  per day C. Montelukast  10 mg - 1 tablet 1 time per day D. Tezepelumab  injections every 4 weeks.  2.  Treat and prevent reflux:  A.  Famotidine  40 mg - 1 tablet in evening  B.  Esomeprazole  40 mg - 1 tablet 1-2 times per day  3. If needed:  A. AIRSUPRA   - 2 inhalations every 4-6 hours (replaces albuterol )  4. For this recent event:  A.  Attempt to minimize any throat clearing or coughing.  Replace with drinking/swallowing B.  Increase Esomeprazole  - twice a day C.  Increase Breztri  - twice a day D.  Start OTC Mucinex  DM - twice a day E.  Prednisone  10 mg - 1 tablet daily for 5 days F.  Check a home combo covid/flu swab  5. Return to clinic in 1 year or earlier if problem  6. Influenza = Tamiflu. 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  Prior to this event he was doing very well with using anti-TSLP antibody as well as some anti-inflammatory medications for his airway and also addressing his issue of reflux and he will maintain treatment for those issues and assuming he does well we will see him back in this clinic in 1 year or earlier if there is a problem.  Camellia Denis, MD Allergy / Immunology [facility]

## 2024-01-09 ENCOUNTER — Encounter: Payer: Self-pay | Admitting: Allergy and Immunology

## 2024-01-11 ENCOUNTER — Telehealth: Payer: Self-pay

## 2024-01-11 ENCOUNTER — Other Ambulatory Visit: Payer: Self-pay | Admitting: Allergy

## 2024-01-11 LAB — CUP PACEART REMOTE DEVICE CHECK
Battery Remaining Longevity: 133 mo
Battery Voltage: 3.02 V
Brady Statistic RA Percent Paced: 0 %
Brady Statistic RV Percent Paced: 98.27 %
Date Time Interrogation Session: 20251204174334
Implantable Lead Connection Status: 753985
Implantable Lead Connection Status: 753985
Implantable Lead Implant Date: 20150413
Implantable Lead Implant Date: 20150413
Implantable Lead Location: 753859
Implantable Lead Location: 753860
Implantable Lead Model: 5076
Implantable Lead Model: 5076
Implantable Pulse Generator Implant Date: 20240304
Lead Channel Impedance Value: 342 Ohm
Lead Channel Impedance Value: 380 Ohm
Lead Channel Impedance Value: 399 Ohm
Lead Channel Impedance Value: 494 Ohm
Lead Channel Pacing Threshold Amplitude: 0.875 V
Lead Channel Pacing Threshold Pulse Width: 0.4 ms
Lead Channel Sensing Intrinsic Amplitude: 0.875 mV
Lead Channel Sensing Intrinsic Amplitude: 0.875 mV
Lead Channel Sensing Intrinsic Amplitude: 19.875 mV
Lead Channel Sensing Intrinsic Amplitude: 19.875 mV
Lead Channel Setting Pacing Amplitude: 2 V
Lead Channel Setting Pacing Pulse Width: 0.4 ms
Lead Channel Setting Sensing Sensitivity: 2 mV
Zone Setting Status: 755011

## 2024-01-11 MED ORDER — PREDNISONE 10 MG PO TABS: 10.0000 mg | ORAL_TABLET | Freq: Every day | ORAL | 0 refills | Status: AC

## 2024-01-11 NOTE — Progress Notes (Signed)
 Sent prednisone  as per Dr Maurilio rec to his local pharmacy as the previous prednisone  ordered at the visit appears was sent to the mail order pharmacy.

## 2024-01-11 NOTE — Telephone Encounter (Signed)
 Called and left VM for the following notation below:  See what he has been taking/using for his current symptoms.   Per [facility] note he is to be doing the following:   Esomeprazole  twice a day   Breztri  2 puffs twice a day   OTC Mucinex  DM twice a day   Prednisone  10 mg - 1 tablet daily for 5 days   Check a home combo covid/flu swab  If/when patient calls back, verify if he has been doing the advised regimen per Dr. Maurilio.

## 2024-01-11 NOTE — Progress Notes (Signed)
 Remote PPM Transmission

## 2024-01-13 ENCOUNTER — Ambulatory Visit: Payer: Self-pay | Admitting: Internal Medicine

## 2024-01-14 ENCOUNTER — Other Ambulatory Visit (HOSPITAL_COMMUNITY): Payer: Self-pay

## 2024-01-14 ENCOUNTER — Telehealth: Payer: Self-pay

## 2024-01-14 NOTE — Telephone Encounter (Signed)
*  AA  Pharmacy Patient Advocate Encounter   Received notification from Fax that prior authorization for Esomeprazole  40mg  Capsule is required/requested.   Insurance verification completed.   The patient is insured through [facility].   Preferred and required step therapy: USE 2 - GEN LANSOPRAZOLE  CAP, OMEPRAZOLE CAP, OR PANTOPRAZOLE  TAB FIRST MAXIMUM DAILY DOSE OF 1.0000

## 2024-01-16 MED ORDER — PANTOPRAZOLE SODIUM 40 MG PO TBEC: 40.0000 mg | DELAYED_RELEASE_TABLET | Freq: Two times a day (BID) | ORAL | 1 refills | Status: AC

## 2024-01-16 NOTE — Telephone Encounter (Signed)
 I sent in pantoprazole  40 mg 1 tablet by mouth twice daily. Patient is aware of change.

## 2024-01-22 ENCOUNTER — Encounter: Payer: Self-pay | Admitting: Podiatry

## 2024-01-22 ENCOUNTER — Ambulatory Visit (HOSPITAL_COMMUNITY)
Admission: RE | Admit: 2024-01-22 | Discharge: 2024-01-22 | Disposition: A | Source: Ambulatory Visit | Attending: Internal Medicine | Admitting: Internal Medicine

## 2024-01-22 ENCOUNTER — Ambulatory Visit (INDEPENDENT_AMBULATORY_CARE_PROVIDER_SITE_OTHER): Admitting: Podiatry

## 2024-01-22 DIAGNOSIS — B351 Tinea unguium: Secondary | ICD-10-CM

## 2024-01-22 DIAGNOSIS — M79675 Pain in left toe(s): Secondary | ICD-10-CM | POA: Diagnosis not present

## 2024-01-22 DIAGNOSIS — R55 Syncope and collapse: Secondary | ICD-10-CM | POA: Diagnosis not present

## 2024-01-22 DIAGNOSIS — M79674 Pain in right toe(s): Secondary | ICD-10-CM

## 2024-01-22 DIAGNOSIS — Z7901 Long term (current) use of anticoagulants: Secondary | ICD-10-CM

## 2024-01-22 DIAGNOSIS — R0602 Shortness of breath: Secondary | ICD-10-CM | POA: Diagnosis not present

## 2024-01-22 LAB — ECHOCARDIOGRAM COMPLETE
AR max vel: 1.55 cm2
AV Area VTI: 1.53 cm2
AV Area mean vel: 1.39 cm2
AV Mean grad: 9.4 mmHg
AV Peak grad: 17.3 mmHg
Ao pk vel: 2.08 m/s
MV M vel: 4.87 m/s
MV Peak grad: 94.9 mmHg
P 1/2 time: 609 ms
S' Lateral: 4 cm

## 2024-01-22 NOTE — Progress Notes (Signed)
 This patient presents to the office with chief complaint of long thick painful  big nails.  Patient says the nails are painful walking and wearing shoes.  This patient is unable to self treat.  This patient is unable to trim his  nails since he is unable to reach his nails.  he presents to the office for preventative foot care services. He has coagulation defect due to eliquis.  General Appearance  Alert, conversant and in no acute stress.  Vascular  Dorsalis pedis and posterior tibial  pulses are palpable  bilaterally.  Capillary return is within normal limits  bilaterally. Temperature is within normal limits  bilaterally.  Neurologic  Senn-Weinstein monofilament wire test within normal limits  bilaterally. Muscle power within normal limits bilaterally.  Nails Thick disfigured discolored nails with subungual debris  hallux nails bilaterally. No evidence of bacterial infection or drainage bilaterally.  Orthopedic  No limitations of motion  feet .  No crepitus or effusions noted.  No bony pathology or digital deformities noted. HAV  B/L.  Skin  normotropic skin with no porokeratosis noted bilaterally.  No signs of infections or ulcers noted.     Onychomycosis  Nails  B/L.  Pain in right toes  Pain in left toes  Debridement of nails both feet followed trimming the nails with dremel tool.    RTC 4months.   Helane Gunther DPM

## 2024-01-24 ENCOUNTER — Ambulatory Visit

## 2024-01-24 DIAGNOSIS — J4550 Severe persistent asthma, uncomplicated: Secondary | ICD-10-CM | POA: Diagnosis not present

## 2024-01-25 ENCOUNTER — Telehealth: Payer: Self-pay | Admitting: Allergy

## 2024-01-25 ENCOUNTER — Other Ambulatory Visit: Payer: Self-pay | Admitting: Allergy

## 2024-01-25 DIAGNOSIS — J988 Other specified respiratory disorders: Secondary | ICD-10-CM

## 2024-01-25 NOTE — Telephone Encounter (Signed)
 PT called to advise finishing up prednisone  Rx from [facility], wants someone to listen to breathing this afternoon.  Reviewed with Jeneal, who advised can update Rx for the weekend and schedule w/Anne on Monday @ 9:30 for closer exam  PT thanked and asked if Rx could be sent to [facility] [address] ([address])

## 2024-01-27 NOTE — Progress Notes (Unsigned)
" ° °  [address] [place] [address] Dept: [phone]  FOLLOW UP NOTE  Patient ID: Isaiah Murphy, male    DOB: 1935/01/05  Age: 89 y.o. MRN: [id number] Date of Office Visit: 01/28/2024  Assessment  Chief Complaint: No chief complaint on file.  HPI Isaiah Murphy is an 89 year old male who presents to the clinic for follow-up visit.  He was last seen in this clinic on 01/08/2024 by Dr. [facility] for evaluation of asthma, allergic rhinitis, and reflux.  At that time, he was prescribed prednisone  10 mg x 5 days.  He did call our on-call physician over the weekend and request a refill of the prednisone  10 mg for 5 days.  Discussed the use of AI scribe software for clinical note transcription with the patient, who gave verbal consent to proceed.  History of Present Illness      Drug Allergies:  Allergies[1]  Physical Exam: There were no vitals taken for this visit.   Physical Exam  Diagnostics:    Assessment and Plan: No diagnosis found.  No orders of the defined types were placed in this encounter.   There are no Patient Instructions on file for this visit.  No follow-ups on file.    Thank you for the opportunity to care for this patient.  Please do not hesitate to contact me with questions.  Arlean Mutter, FNP [facility]          [1]  Allergies Allergen Reactions   Penicillins Rash    Has patient had a PCN reaction causing immediate rash, facial/tongue/throat swelling, SOB or lightheadedness with hypotension: Yes Has patient had a PCN reaction causing severe rash involving mucus membranes or skin necrosis: Yes Has patient had a PCN reaction that required hospitalization: No Has patient had a PCN reaction occurring within the last 10 years: No If all of the above answers are NO, then may proceed with Cephalosporin use. Rash at the injection site   "

## 2024-01-27 NOTE — Patient Instructions (Incomplete)
" °  1.  Treat and prevent inflammation of airway:   A. Breztri  - 2 inhalations 1-2 times per day w/ spacer  B. Flonase  - 1 spray each nostril 1-2  times per day C. Montelukast  10 mg - 1 tablet 1 time per day D. Tezepelumab  injections every 4 weeks.  2.  Treat and prevent reflux:  A.  Famotidine  40 mg - 1 tablet in evening  B.  Esomeprazole  40 mg - 1 tablet 1-2 times per day  3. If needed:  A. AIRSUPRA   - 2 inhalations every 4-6 hours (replaces albuterol )  4. For this recent event:  A.  Attempt to minimize any throat clearing or coughing.  Replace with drinking/swallowing B.  Increase Esomeprazole  - twice a day C.  Increase Breztri  - twice a day D.  Start OTC Mucinex  DM - twice a day E.  Prednisone  10 mg - 1 tablet daily for 5 days F.  Check a home combo covid/flu swab  5. Return to clinic in 1 year or earlier if problem  6. Influenza = Tamiflu. Covid = Paxlovid "

## 2024-01-28 ENCOUNTER — Ambulatory Visit (INDEPENDENT_AMBULATORY_CARE_PROVIDER_SITE_OTHER): Admitting: Family Medicine

## 2024-01-28 ENCOUNTER — Encounter: Payer: Self-pay | Admitting: Family Medicine

## 2024-01-28 ENCOUNTER — Other Ambulatory Visit: Payer: Self-pay

## 2024-01-28 VITALS — BP 136/88 | HR 77 | Temp 97.5°F

## 2024-01-28 DIAGNOSIS — J4551 Severe persistent asthma with (acute) exacerbation: Secondary | ICD-10-CM

## 2024-01-28 DIAGNOSIS — K219 Gastro-esophageal reflux disease without esophagitis: Secondary | ICD-10-CM

## 2024-01-28 DIAGNOSIS — J3089 Other allergic rhinitis: Secondary | ICD-10-CM | POA: Diagnosis not present

## 2024-01-28 MED ORDER — FAMOTIDINE 40 MG PO TABS: 40.0000 mg | ORAL_TABLET | Freq: Every evening | ORAL | 1 refills | Status: AC

## 2024-02-04 ENCOUNTER — Ambulatory Visit: Payer: Self-pay | Admitting: Cardiology

## 2024-02-21 ENCOUNTER — Ambulatory Visit

## 2024-02-21 DIAGNOSIS — J4550 Severe persistent asthma, uncomplicated: Secondary | ICD-10-CM

## 2024-02-21 DIAGNOSIS — J4551 Severe persistent asthma with (acute) exacerbation: Secondary | ICD-10-CM

## 2024-02-21 NOTE — Telephone Encounter (Signed)
 Patient scheduled for 2:20 on 1/16 per Dr. Michele

## 2024-02-21 NOTE — Telephone Encounter (Signed)
-----   Message from [place], [place] sent at 02/04/2024  3:13 PM EST ----- Please inform make a follow-up appointment for [name] to review echo results and his symptoms of shortness of breath.  When compared to prior study his LVEF has trended down which may be  contributing to his shortness of breath.  EKG on arrival.  Dr. Michele

## 2024-02-22 ENCOUNTER — Encounter: Payer: Self-pay | Admitting: Cardiology

## 2024-02-22 ENCOUNTER — Ambulatory Visit: Attending: Cardiology | Admitting: Cardiology

## 2024-02-22 ENCOUNTER — Other Ambulatory Visit (HOSPITAL_COMMUNITY): Payer: Self-pay

## 2024-02-22 VITALS — BP 144/96 | HR 87 | Resp 16 | Ht 69.0 in | Wt 174.8 lb

## 2024-02-22 DIAGNOSIS — Z7901 Long term (current) use of anticoagulants: Secondary | ICD-10-CM | POA: Insufficient documentation

## 2024-02-22 DIAGNOSIS — I429 Cardiomyopathy, unspecified: Secondary | ICD-10-CM | POA: Insufficient documentation

## 2024-02-22 DIAGNOSIS — I442 Atrioventricular block, complete: Secondary | ICD-10-CM | POA: Diagnosis present

## 2024-02-22 DIAGNOSIS — R0602 Shortness of breath: Secondary | ICD-10-CM | POA: Insufficient documentation

## 2024-02-22 DIAGNOSIS — Z950 Presence of cardiac pacemaker: Secondary | ICD-10-CM | POA: Insufficient documentation

## 2024-02-22 DIAGNOSIS — I10 Essential (primary) hypertension: Secondary | ICD-10-CM | POA: Diagnosis present

## 2024-02-22 DIAGNOSIS — I4811 Longstanding persistent atrial fibrillation: Secondary | ICD-10-CM | POA: Diagnosis present

## 2024-02-22 DIAGNOSIS — I359 Nonrheumatic aortic valve disorder, unspecified: Secondary | ICD-10-CM | POA: Insufficient documentation

## 2024-02-22 MED ORDER — NITROGLYCERIN 0.4 MG SL SUBL: 0.4000 mg | SUBLINGUAL_TABLET | SUBLINGUAL | 3 refills | Status: DC | PRN

## 2024-02-22 MED ORDER — NITROGLYCERIN 0.4 MG SL SUBL
0.4000 mg | SUBLINGUAL_TABLET | SUBLINGUAL | 0 refills | Status: AC | PRN
  Filled 2024-02-22: qty 25, 8d supply, fill #0

## 2024-02-22 NOTE — Progress Notes (Signed)
 " Cardiology Office Note:  .   ID:  BURTON GAHAN, DOB 1934-10-31, MRN [id number] PCP:  Onita Rush, MD  Former Cardiology Providers: Victory LELON Claudene DOUGLAS, MD  [facility] Providers Cardiologist:  Madonna Large, DO , [facility] (established care 12/13/23) Electrophysiologist:  None  Click to update primary MD,subspecialty MD or APP then REFRESH:1}    Chief Complaint  Patient presents with   Follow-up    Afib, shortness of breath, discuss echo results    Patient Profile   ANTWIAN SANTAANA is a 89 y.o. African-American male whose past medical history and cardiovascular risk factors includes: Cardiomyopathy, Aortic stenosis / regurgitation, Hypertension, sinus node dysfunction/complete heart block/pacemaker implant, longstanding persistent atrial fibrillation, hyperlipidemia hypertension, Aortic atherosclerosis.  Visits  Formerly under the care of Dr. Claudene.  12/13/2023: established care, endorsed dyspnea, echo ordered.  01/22/2024: echo notes new decrease in LVEF, so asked to come in sooner to discuss results and symptoms.   History of Present Illness   History of Present Illness   He is accompanied by his daughters, Charlies and Sem Mccaughey (over the phone on speaker). He transitioned from Dr. Theressa practice after Dr. Theressa retirement.  He reports new exertional shortness of breath when walking up the incline of his driveway to get the mail, requiring a brief pause at the top. This is worse than his prior baseline. He denies chest pain, lightheadedness, dizziness, syncope, orthopnea, or nocturnal dyspnea. He sleeps flat with one pillow.  An echocardiogram in 2015 showed LVEF 50%. Recent testing showed LVEF 30-35%. He has mitral regurgitation and aortic stenosis.  He performs light cardio at the gym on a recumbent bike and machines. He has also noted that he is slower on the stationary bike as well. He is not taking erectile dysfunction medications, which is relevant to possible  nitroglycerin  use.      Review of Systems: .   Review of Systems  Cardiovascular:  Positive for dyspnea on exertion. Negative for chest pain, claudication, irregular heartbeat, leg swelling, near-syncope, orthopnea, palpitations, paroxysmal nocturnal dyspnea and syncope.  Hematologic/Lymphatic: Negative for bleeding problem.    Studies Reviewed:   Echocardiogram: 04/2013: LVEF 50%, mild MR, RV systolic function mildly reduced, moderate RAE  01/22/2024 1. Left ventricular ejection fraction, by estimation, is 30 to 35%. The left ventricle has moderately decreased function. The left ventricle demonstrates regional wall motion abnormalities. Abnormal (paradoxical) septal motion, consistent with RV  pacemaker. There is mild left ventricular hypertrophy. Left ventricular diastolic parameters are indeterminate. 2. Right ventricular systolic function is mildly reduced. The right ventricular size is normal. There is mildly elevated pulmonary artery systolic pressure. The estimated right ventricular systolic pressure is 40.2 mmHg. 3. Left atrial size was mildly dilated. 4. Right atrial size was mildly dilated. 5. The mitral valve is normal in structure. Mild mitral valve regurgitation. 6. The aortic valve is tricuspid. There is moderate calcification of the aortic valve. Aortic valve regurgitation is moderate. Mild to moderate aortic valve stenosis. Vmax 2.3 m/s, MG , AVA 1.2 cm^2, DI 0.36 7. There is mild dilatation of the ascending aorta, measuring 39 mm. 8. The inferior vena cava is normal in size with greater than 50% respiratory variability, suggesting right atrial pressure of 3 mmHg.    RADIOLOGY: N/A  Risk Assessment/Calculations:   Click Here to Calculate/Change CHADS2VASc Score The patient's CHADS2-VASc score is 5, indicating a 7.2% annual risk of stroke.   CHF History: Yes HTN History: Yes Diabetes History: No Stroke History: No  Vascular Disease History: Yes    Labs:        Oja:RNFEOZUZ METABOLIC (Order Date - 11/09/2023) (Collection Date & Time - 11/09/2023 07:04 AM)           Value Reference Range          GLUCOSE 101   60 - 110 - mg/dl          TOTAL BILIRUBIN 2.2 H 0.0 - 1.5 - mg/dl          CREATININE 1.1   0.6 - 1.3 - mg/dl          eGFR Non-African American 63.3   < -          eGFR African American 76.6   < -          SODIUM 139   135 - 148 - mEq/L          POTASSIUM 3.7   3.5 - 5.3 - mEq/L          CHLORIDE 103   80 - 111 - mEq/L          CO2 30   15 - 35 - mEq/L          CALCIUM  8.7   7.0 - 10.5 - mg/dL          TOTAL PROTEIN 6.5   6.0 - 8.5 - g/dL          ALBUMIN 3.9   2.0 - 5.5 - g/dL          AST 29   7 - 45 - IU/L          ALT 20   5 - 40 - IU/L          ALK PHOS 102   37 - 137 - IU/L          BUN 15   5 - 23 - mg/dl          Notes: APE labs       Lab:CBC (Order Date - 11/09/2023) (Collection Date & Time - 11/09/2023 07:04 AM)           Value Reference Range          WBC 7.50   4.10 - 10.90 - K/uL          LYM 1.8   0.6 - 4.1 - K/uL          EOS 0.1   0.0 - 0.4 -          BASO 0.1   0.0 - 0.2 -          LYM% 24.1   10.0 - 58.5 - %          EOS% 1.2   0.0 - 7.8 -          BASO% 0.8   0.0 - 2.0 -          RBC 4.6   4.2 - 6.3 - M/uL          HGB 15.2   12.0 - 18.0 - g/dL          HCT 55.6   62.9 - 51.0 - %          MCV 95.9   80.0 - 97.0 - fL          MCH 32.9 H 26.0 - 32.0 - pg          MCHC 34.3   31.0 -  36.0 - g/dL          RDW 85.7   87.6 - 15.4 -          PLT 168   140 - 440 - K/uL          MPV 8.5   6.9 - 10.6 -          NEU 4.6   1.4 - 7.0 -          NEU% 61.1   43.3 - 71.9 -          MONO 1.0 H 0.1 - 0.9 -          MONO% 12.8 H 4.6 - 12.4 -          Notes: APE labs       Oja:OPEPI (Order Date - 11/09/2023) (Collection Date & Time - 11/09/2023 07:04 AM)           Value Reference Range          CHOLESTEROL 103   100 - 200 - mg/dl          TRIGLYCERIDES 58   0 - 151 - mg/dl          HDL 40   40 - 60 - mg/dl           LDL 51   < - mg/dl          CHOL/HDL RATIO 2.6   < - RATIO          LDL/HDL RATIO 1.3 L 1.7 - 2.5 -          NON-HDL 63.0   < -          Notes: APE labs    - - Lab:A1C   - Collection Date 11/09/2023 10/26/2022 09/29/2021 08/31/2020  Collection Time 07:04 AM 07:00 AM 07:00 AM 07:42 AM  Order Date 11/09/2023 10/26/2022 09/29/2021 08/31/2020  A1C 5.1 (Ref Range: 4.5 - 6.0 %) 5.4 (Ref Range: 4.5 - 6.0 %) 5.2 (Ref Range: 4.5 - 6.0 %) 5.3 (Ref Range: 4.5 - 6.0 %)  Notes: APE labs physical labs. PSA 6.256 is stable and TSH 7.2. I need a free T4 - make sure it is Pending or please add. Physical labs to be discussed at visit. add free T4 due to the abnormal TSH physical labs to be discussed at upcoming Visit. please have lab add Free t4 and Free PSA percent         Lab:PSA, Total (Order Date - 11/09/2023) (Collection Date & Time - 11/09/2023 07:04 AM)           Value Reference Range          Prostate Specific Ag 8.7 H 0.0-4.0 - ng/mL          Notes: APE labs       Lab:TSH (Order Date - 11/09/2023) (Collection Date & Time - 11/09/2023 07:04 AM)           Value Reference Range          TSH 8.610 H 0.450-4.500 - uIU/mL          Notes: APE labs       Oja:Jenopenemnuzpw B (Order Date - 11/09/2023) (Collection Date & Time - 11/09/2023 07:04 AM)           Value Reference Range          Apolipoprotein B 48   <90 - mg/dL  Notes: APE labs       Oja:Fprmnjo/Rmzju Ratio UA-TPB (Order Date - 11/02/2022) (Collection Date & Time - 11/02/2022 10:46 AM)           Value Reference Range          Creatinine, Urine 167.9   Not Estab. - mg/dL          Albumin, Urine 593.2   Not Estab. - ug/mL          Alb/Creat Ratio 353 H 0-29 - mg/g creat          Notes: UA, micro OK and microalb +. Only 3-10 RBCsRusso, John Michael 11/06/2022 06:40:02 PM > some microalbuminuria for ? reason. recheck Microalb in 4 weeks c SPEPCross, Rosina 11/07/2022 08:17:20 AM > See telephone note r/t lab results     - - Lab:TSH   - Collection Date 10/26/2022 09/29/2021 08/31/2020  Collection Time 07:00 AM 07:00 AM 07:42 AM  Order Date 10/26/2022 09/29/2021 08/31/2020  TSH 7.20    H (Ref Range: 0.40 - 4.20 uIU/ml) 4.54    H (Ref Range: 0.40 - 4.20 uIU/ml) 4.41    H (Ref Range: 0.40 - 4.20 uIU/ml)        Physical Exam:    Today's Vitals   02/22/24 1428  BP: (!) 144/96  Pulse: 87  Resp: 16  SpO2: 97%  Weight: 174 lb 12.8 oz (79.3 kg)  Height: 5' 9 (1.753 m)   Body mass index is 25.81 kg/m. Wt Readings from Last 3 Encounters:  02/22/24 174 lb 12.8 oz (79.3 kg)  01/08/24 172 lb 9.6 oz (78.3 kg)  12/13/23 173 lb 6.4 oz (78.7 kg)    Physical Exam  Constitutional: No distress.  hemodynamically stable  Neck: No JVD present.  Cardiovascular: Normal rate, S1 normal and S2 normal. An irregularly irregular rhythm present. Exam reveals no gallop, no S3 and no S4.  No murmur heard. Pulmonary/Chest: Effort normal and breath sounds normal. No stridor. He has no wheezes. He has no rales.  Left infraclavicular pacemaker in situ  Musculoskeletal:        General: No edema.     Cervical back: Neck supple.  Skin: Skin is warm.    Impression & Recommendation(s):  Impression: 1. Cardiomyopathy, unspecified type (HCC)   2. Shortness of breath   3. Aortic valve disease   4. Complete heart block (HCC)   5. Pacemaker   6. Longstanding persistent atrial fibrillation (HCC)   7. Long term (current) use of anticoagulants   8. Benign hypertension    Recommendation(s):  Assessment & Plan Cardiomyopathy, unspecified type [facility]) Newly discovered Echo December 2025: LVEF 30-35% Continue azilsartan/chlorthalidone  40/12.5 mg p.o. every afternoon Continue amlodipine  2.5 mg p.o. daily Recommend strict I's and O's, daily weights Recommend ischemic evaluation to rule out possible CAD given his risk factors. Plan cardiac PET/CT for further evaluation. Will uptitrate GDMT at the next office visit after  CAD evaluation.  Recommend transitioning from azilsartan/chlorthalidone  to Entresto and amlodipine  2 SGLT2 inhibitors.   Orders:   NM PET CT CARDIAC PERFUSION MULTI W/ABSOLUTE BLOODFLOW; Future   Cardiac Stress Test: Informed Consent Details: Physician/Practitioner Attestation; Transcribe to consent form and obtain patient signature  Aortic valve disease Newly discovered. Echocardiogram December 2025: Moderate AR and mild to moderate aortic stenosis Patient is advised to seek medical attention if he has new onset of anginal chest pain, heart failure symptoms, or syncope. Will plan 1 year follow-up echocardiogram to evaluate disease progression as this is  newly discovered.    Pacemaker Follows with device clinic, history of complete heart block    Longstanding persistent atrial fibrillation (HCC) Rate control: N/A. Rhythm control: N/A. Thromboembolic prophylaxis: Eliquis . Risks, benefits, alternatives to anticoagulation discussed. Patient does not endorse any evidence of bleeding. Labs from October 2025 reviewed and mentioned above for reference    Benign hypertension Office blood pressures are not at goal. Recommended checking his blood pressures at home as he will help with uptitration of GDMT. Medications as discussed above    Orders Placed:  Orders Placed This Encounter  Procedures   NM PET CT CARDIAC PERFUSION MULTI W/ABSOLUTE BLOODFLOW    Standing Status:   Future    Expected Date:   03/13/2024    Expiration Date:   02/21/2025    If indicated for the ordered procedure, I authorize the administration of a radiopharmaceutical per Radiology protocol:   Yes    Preferred Imaging Location:   [facility]   Cardiac Stress Test: Informed Consent Details: Physician/Practitioner Attestation; Transcribe to consent form and obtain patient signature    Physician/Practitioner attestation of informed consent for procedure/surgical case:   I, the physician/practitioner, attest that  I have discussed with the patient the benefits, risks, side effects, alternatives, likelihood of achieving goals and potential problems during recovery for the procedure that I have provided informed consent.    Procedure:   Cardiac PET CT    Indication/Reason:   Cardiomyopathy     Final Medication List:    Meds ordered this encounter  Medications   DISCONTD: nitroGLYCERIN  (NITROSTAT ) 0.4 MG SL tablet    Sig: Place 1 tablet (0.4 mg total) under the tongue every 5 (five) minutes as needed for chest pain.    Dispense:  90 tablet    Refill:  3   nitroGLYCERIN  (NITROSTAT ) 0.4 MG SL tablet    Sig: Place 1 tablet (0.4 mg total) under the tongue every 5 (five) minutes as needed for chest pain.    Dispense:  25 tablet    Refill:  0    Medications Discontinued During This Encounter  Medication Reason   nitroGLYCERIN  (NITROSTAT ) 0.4 MG SL tablet       Current Outpatient Medications:    Albuterol -Budesonide  (AIRSUPRA ) 90-80 MCG/ACT AERO, Inhale 2 puffs into the lungs every 4 (four) hours as needed., Disp: 10.7 g, Rfl: 1   amLODipine  (NORVASC ) 2.5 MG tablet, Take 1 tablet by mouth daily., Disp: , Rfl:    apixaban  (ELIQUIS ) 5 MG TABS tablet, TAKE 1 TABLET BY MOUTH TWICE A DAY, Disp: 60 tablet, Rfl: 5   atorvastatin  (LIPITOR) 20 MG tablet, Take 20 mg by mouth at bedtime. , Disp: , Rfl:    Azilsartan-Chlorthalidone  (EDARBYCLOR ) 40-12.5 MG TABS, Take 1 tablet by mouth every evening., Disp: , Rfl:    budesonide -glycopyrrolate-formoterol  (BREZTRI  AEROSPHERE) 160-9-4.8 MCG/ACT AERO inhaler, Inhale 2 puffs into the lungs in the morning and at bedtime., Disp: 32.1 g, Rfl: 1   famotidine  (PEPCID ) 40 MG tablet, Take 1 tablet (40 mg total) by mouth at bedtime., Disp: 90 tablet, Rfl: 1   fluticasone  (FLONASE ) 50 MCG/ACT nasal spray, Place 1 spray into both nostrils 2 (two) times daily as needed for allergies or rhinitis., Disp: 48 g, Rfl: 3   levocetirizine (XYZAL ) 5 MG tablet, TAKE 1 TABLET BY MOUTH EVERY  DAY IN THE EVENING, Disp: 90 tablet, Rfl: 1   montelukast  (SINGULAIR ) 10 MG tablet, Take 1 tablet (10 mg total) by mouth at bedtime., Disp: 90 tablet,  Rfl: 1   olopatadine  (PATANOL) 0.1 % ophthalmic solution, Place 1 drop into both eyes daily., Disp: 15 mL, Rfl: 1   pantoprazole  (PROTONIX ) 40 MG tablet, Take 1 tablet (40 mg total) by mouth 2 (two) times daily., Disp: 90 tablet, Rfl: 1   predniSONE  (DELTASONE ) 10 MG tablet, Take 1 tablet (10 mg total) by mouth daily with breakfast., Disp: 5 tablet, Rfl: 0   Spacer/Aero-Holding Chambers DEVI, 1 Device by Does not apply route as directed., Disp: 1 each, Rfl: 1   triamcinolone  cream (KENALOG ) 0.1 %, Apply 1 Application topically 2 (two) times daily as needed (itching)., Disp: 454 g, Rfl: 1   nitroGLYCERIN  (NITROSTAT ) 0.4 MG SL tablet, Place 1 tablet (0.4 mg total) under the tongue every 5 (five) minutes as needed for chest pain., Disp: 25 tablet, Rfl: 0  Current Facility-Administered Medications:    tezepelumab -ekko (TEZSPIRE ) 210 MG/1. syringe 210 mg, 210 mg, Subcutaneous, Q28 days, Kozlow, Camellia PARAS, MD, 210 mg at 02/21/24 1153  Consent:   Informed Consent   Shared Decision Making/Informed Consent The risks [chest pain, shortness of breath, cardiac arrhythmias, dizziness, blood pressure fluctuations, myocardial infarction, stroke/transient ischemic attack, nausea, vomiting, allergic reaction, radiation exposure, metallic taste sensation and life-threatening complications (estimated to be 1 in 10,000)], benefits (risk stratification, diagnosing coronary artery disease, treatment guidance) and alternatives of a cardiac PET stress test were discussed in detail with Mr. Melman and he agrees to proceed.     Disposition:   6 weeks with APP .  His questions and concerns were addressed to his satisfaction. He voices understanding of the recommendations provided during this encounter.    Signed, Madonna Michele HAS, [facility]  A Division  of [facility] [address] [place], [place] [address]  7:44 PM "

## 2024-02-22 NOTE — Patient Instructions (Signed)
 Medication Instructions:  START Nitroglycerin  0.4mg  Take 1 as needed for emergency chest pain. Take first dose for emergency chest pain; WAIT 5 minutes and then take 2nd dose. IF still having pain if still having chest pain CALL 911 wait an additional 5 minutes before taking final dose. Do not take more than 3 doses in a day.  *If you need a refill on your cardiac medications before your next appointment, please call your pharmacy*  Lab Work: None ordered If you have labs (blood work) drawn today and your tests are completely normal, you will receive your results only by: MyChart Message (if you have MyChart) OR A paper copy in the mail If you have any lab test that is abnormal or we need to change your treatment, we will call you to review the results.  Testing/Procedures: Cardiac PET CT  Follow-Up: At [facility], you and your health needs are our priority.  As part of our continuing mission to provide you with exceptional heart care, our providers are all part of one team.  This team includes your primary Cardiologist (physician) and Advanced Practice Providers or APPs (Physician Assistants and Nurse Practitioners) who all work together to provide you with the care you need, when you need it.  Your next appointment:   6 week(s)  Provider:   Hao Meng, PA-C or Glendia Ferrier, PA-C      Then, Sunit Tolia, DO will plan to see you again in 3 month(s).    We recommend signing up for the patient portal called MyChart.  Sign up information is provided on this After Visit Summary.  MyChart is used to connect with patients for Virtual Visits (Telemedicine).  Patients are able to view lab/test results, encounter notes, upcoming appointments, etc.  Non-urgent messages can be sent to your provider as well.   To learn more about what you can do with MyChart, go to [email or web address].   Other Instructions     Please report to Radiology at the [facility] Main Entrance 30  minutes early for your test.  [address] [place], [place] [address]   How to Prepare for Your Cardiac PET/CT Stress Test:  Nothing to eat or drink, except water , 3 hours prior to arrival time.  NO caffeine/decaffeinated products, or chocolate 12 hours prior to arrival. (Please note decaffeinated beverages (teas/coffees) still contain caffeine).  If you have caffeine within 12 hours prior, the test will need to be rescheduled.  Medication instructions: Do not take erectile dysfunction medications for 72 hours prior to test (sildenafil, tadalafil) Do not take nitrates (isosorbide mononitrate, Ranexa) the day before or day of test Do not take tamsulosin the day before or morning of test Hold theophylline containing medications for 12 hours. Hold Dipyridamole 48 hours prior to the test.  Diabetic Preparation: If able to eat breakfast prior to 3 hour fasting, you may take all medications, including your insulin. Do not worry if you miss your breakfast dose of insulin - start at your next meal. If you do not eat prior to 3 hour fast-Hold all diabetes (oral and insulin) medications. Patients who wear a continuous glucose monitor MUST remove the device prior to scanning.  You may take your remaining medications with water .  NO perfume, cologne or lotion on chest or abdomen area.  Total time is 1 to 2 hours; you may want to bring reading material for the waiting time.   In preparation for your appointment, medication and supplies will be purchased.  Appointment availability is limited, so if you need to cancel or reschedule, please call the Radiology Department Scheduler at [phone] 24 hours in advance to avoid a cancellation fee of $100.00  What to Expect When you Arrive:  Once you arrive and check in for your appointment, you will be taken to a preparation room within the Radiology Department.  A technologist or Nurse will obtain your medical history, verify that you are  correctly prepped for the exam, and explain the procedure.  Afterwards, an IV will be started in your arm and electrodes will be placed on your skin for EKG monitoring during the stress portion of the exam. Then you will be escorted to the PET/CT scanner.  There, staff will get you positioned on the scanner and obtain a blood pressure and EKG.  During the exam, you will continue to be connected to the EKG and blood pressure machines.  A small, safe amount of a radioactive tracer will be injected in your IV to obtain a series of pictures of your heart along with an injection of a stress agent.    After your Exam:  It is recommended that you eat a meal and drink a caffeinated beverage to counter act any effects of the stress agent.  Drink plenty of fluids for the remainder of the day and urinate frequently for the first couple of hours after the exam.  Your doctor will inform you of your test results within 7-10 business days.  For more information and frequently asked questions, please visit our website: [email or web address]  For questions about your test or how to prepare for your test, please call: Cardiac Imaging Nurse Navigators Office: [phone]

## 2024-03-01 NOTE — Assessment & Plan Note (Signed)
 Follows with device clinic, history of complete heart block

## 2024-03-19 ENCOUNTER — Ambulatory Visit (HOSPITAL_COMMUNITY)

## 2024-03-20 ENCOUNTER — Ambulatory Visit

## 2024-03-31 ENCOUNTER — Ambulatory Visit: Admitting: Physician Assistant

## 2024-05-22 ENCOUNTER — Ambulatory Visit: Admitting: Cardiology

## 2024-05-22 ENCOUNTER — Ambulatory Visit: Admitting: Podiatry

## 2024-08-26 ENCOUNTER — Ambulatory Visit: Admitting: Allergy and Immunology
